# Patient Record
Sex: Female | Born: 1935 | Race: White | Hispanic: No | Smoking: Never smoker
Health system: Southern US, Community
[De-identification: ages and names within clinical notes are randomized; demographics above are authoritative.]

## PROBLEM LIST (undated history)

## (undated) DIAGNOSIS — Z800 Family history of malignant neoplasm of digestive organs: Secondary | ICD-10-CM

## (undated) DIAGNOSIS — K589 Irritable bowel syndrome without diarrhea: Secondary | ICD-10-CM

## (undated) DIAGNOSIS — K623 Rectal prolapse: Secondary | ICD-10-CM

## (undated) DIAGNOSIS — F3289 Other specified depressive episodes: Secondary | ICD-10-CM

## (undated) DIAGNOSIS — F329 Major depressive disorder, single episode, unspecified: Secondary | ICD-10-CM

## (undated) DIAGNOSIS — M545 Low back pain, unspecified: Secondary | ICD-10-CM

## (undated) DIAGNOSIS — R140 Abdominal distension (gaseous): Secondary | ICD-10-CM

## (undated) DIAGNOSIS — R5383 Other fatigue: Secondary | ICD-10-CM

## (undated) DIAGNOSIS — R197 Diarrhea, unspecified: Secondary | ICD-10-CM

## (undated) DIAGNOSIS — M1990 Unspecified osteoarthritis, unspecified site: Secondary | ICD-10-CM

## (undated) DIAGNOSIS — R5381 Other malaise: Secondary | ICD-10-CM

## (undated) DIAGNOSIS — K5909 Other constipation: Secondary | ICD-10-CM

## (undated) DIAGNOSIS — K626 Ulcer of anus and rectum: Secondary | ICD-10-CM

## (undated) HISTORY — DX: Other malaise: R53.83

## (undated) HISTORY — DX: Other specified depressive episodes: F32.89

## (undated) HISTORY — DX: Ulcer of anus and rectum: K62.6

## (undated) HISTORY — DX: Major depressive disorder, single episode, unspecified: F32.9

## (undated) HISTORY — DX: Abdominal distension (gaseous): R14.0

## (undated) HISTORY — DX: Diarrhea, unspecified: R19.7

## (undated) HISTORY — PX: APPENDECTOMY: SHX54

## (undated) HISTORY — DX: Low back pain: M54.5

## (undated) HISTORY — PX: COLON SURGERY: SHX602

## (undated) HISTORY — DX: Irritable bowel syndrome, unspecified: K58.9

## (undated) HISTORY — PX: CHOLECYSTECTOMY: SHX55

## (undated) HISTORY — PX: ABDOMINAL HYSTERECTOMY: SHX81

## (undated) HISTORY — DX: Other constipation: K59.09

## (undated) HISTORY — DX: Other malaise: R53.81

## (undated) HISTORY — PX: HEMICOLECTOMY: SHX854

## (undated) HISTORY — DX: Family history of malignant neoplasm of digestive organs: Z80.0

## (undated) HISTORY — DX: Unspecified osteoarthritis, unspecified site: M19.90

## (undated) HISTORY — DX: Rectal prolapse: K62.3

## (undated) HISTORY — DX: Low back pain, unspecified: M54.50

## (undated) HISTORY — PX: OTHER SURGICAL HISTORY: SHX169

---

## 1998-12-22 ENCOUNTER — Encounter: Payer: Self-pay | Admitting: Obstetrics and Gynecology

## 1998-12-22 ENCOUNTER — Ambulatory Visit (HOSPITAL_COMMUNITY): Admission: RE | Admit: 1998-12-22 | Discharge: 1998-12-22 | Payer: Self-pay | Admitting: Obstetrics and Gynecology

## 2000-01-04 ENCOUNTER — Ambulatory Visit (HOSPITAL_COMMUNITY): Admission: RE | Admit: 2000-01-04 | Discharge: 2000-01-04 | Payer: Self-pay | Admitting: Obstetrics and Gynecology

## 2000-01-04 ENCOUNTER — Encounter: Payer: Self-pay | Admitting: Obstetrics and Gynecology

## 2000-01-20 ENCOUNTER — Other Ambulatory Visit: Admission: RE | Admit: 2000-01-20 | Discharge: 2000-01-20 | Payer: Self-pay | Admitting: Obstetrics and Gynecology

## 2000-05-10 ENCOUNTER — Encounter: Payer: Self-pay | Admitting: Urology

## 2000-05-15 ENCOUNTER — Inpatient Hospital Stay (HOSPITAL_COMMUNITY): Admission: RE | Admit: 2000-05-15 | Discharge: 2000-05-17 | Payer: Self-pay | Admitting: Urology

## 2002-02-21 ENCOUNTER — Emergency Department (HOSPITAL_COMMUNITY): Admission: EM | Admit: 2002-02-21 | Discharge: 2002-02-22 | Payer: Self-pay | Admitting: Emergency Medicine

## 2003-01-29 ENCOUNTER — Other Ambulatory Visit: Admission: RE | Admit: 2003-01-29 | Discharge: 2003-01-29 | Payer: Self-pay | Admitting: Obstetrics and Gynecology

## 2004-08-25 ENCOUNTER — Other Ambulatory Visit: Admission: RE | Admit: 2004-08-25 | Discharge: 2004-08-25 | Payer: Self-pay | Admitting: Obstetrics and Gynecology

## 2004-11-23 ENCOUNTER — Ambulatory Visit: Payer: Self-pay | Admitting: Internal Medicine

## 2004-11-24 ENCOUNTER — Ambulatory Visit: Payer: Self-pay | Admitting: Internal Medicine

## 2005-09-13 ENCOUNTER — Ambulatory Visit: Payer: Self-pay | Admitting: Internal Medicine

## 2005-09-16 ENCOUNTER — Encounter (INDEPENDENT_AMBULATORY_CARE_PROVIDER_SITE_OTHER): Payer: Self-pay | Admitting: Specialist

## 2005-09-16 ENCOUNTER — Ambulatory Visit: Payer: Self-pay | Admitting: Internal Medicine

## 2005-10-26 ENCOUNTER — Ambulatory Visit: Payer: Self-pay | Admitting: Internal Medicine

## 2005-11-09 LAB — HM COLONOSCOPY: HM Colonoscopy: NORMAL

## 2007-06-25 ENCOUNTER — Ambulatory Visit: Payer: Self-pay | Admitting: Internal Medicine

## 2007-08-07 ENCOUNTER — Emergency Department (HOSPITAL_COMMUNITY): Admission: EM | Admit: 2007-08-07 | Discharge: 2007-08-07 | Payer: Self-pay | Admitting: Emergency Medicine

## 2008-04-22 ENCOUNTER — Encounter: Payer: Self-pay | Admitting: Internal Medicine

## 2008-04-22 DIAGNOSIS — K589 Irritable bowel syndrome without diarrhea: Secondary | ICD-10-CM | POA: Insufficient documentation

## 2008-04-22 DIAGNOSIS — K623 Rectal prolapse: Secondary | ICD-10-CM | POA: Insufficient documentation

## 2008-04-22 DIAGNOSIS — K5909 Other constipation: Secondary | ICD-10-CM | POA: Insufficient documentation

## 2008-04-22 DIAGNOSIS — K626 Ulcer of anus and rectum: Secondary | ICD-10-CM | POA: Insufficient documentation

## 2008-06-10 ENCOUNTER — Encounter: Payer: Self-pay | Admitting: Internal Medicine

## 2008-07-24 ENCOUNTER — Telehealth: Payer: Self-pay | Admitting: Internal Medicine

## 2008-08-21 ENCOUNTER — Ambulatory Visit (HOSPITAL_COMMUNITY): Admission: RE | Admit: 2008-08-21 | Discharge: 2008-08-21 | Payer: Self-pay | Admitting: Internal Medicine

## 2008-08-21 ENCOUNTER — Ambulatory Visit: Payer: Self-pay | Admitting: Internal Medicine

## 2008-08-21 DIAGNOSIS — R5381 Other malaise: Secondary | ICD-10-CM | POA: Insufficient documentation

## 2008-08-21 DIAGNOSIS — R5383 Other fatigue: Secondary | ICD-10-CM | POA: Insufficient documentation

## 2008-08-21 LAB — CONVERTED CEMR LAB: Tissue Transglutaminase Ab, IgA: 0.4 units (ref <7)

## 2008-08-22 ENCOUNTER — Encounter: Payer: Self-pay | Admitting: Internal Medicine

## 2008-08-22 LAB — CONVERTED CEMR LAB
Basophils Absolute: 0 10*3/uL (ref 0.0–0.1)
Basophils Relative: 0.6 % (ref 0.0–3.0)
Eosinophils Absolute: 0.1 10*3/uL (ref 0.0–0.7)
Eosinophils Relative: 1.7 % (ref 0.0–5.0)
HCT: 38.5 % (ref 36.0–46.0)
Hemoglobin: 13.4 g/dL (ref 12.0–15.0)
Iron: 70 ug/dL (ref 42–145)
Lymphocytes Relative: 12.9 % (ref 12.0–46.0)
MCHC: 34.8 g/dL (ref 30.0–36.0)
MCV: 92.5 fL (ref 78.0–100.0)
Monocytes Absolute: 0.4 10*3/uL (ref 0.1–1.0)
Monocytes Relative: 5.9 % (ref 3.0–12.0)
Neutro Abs: 5 10*3/uL (ref 1.4–7.7)
Neutrophils Relative %: 78.9 % — ABNORMAL HIGH (ref 43.0–77.0)
Platelets: 285 10*3/uL (ref 150–400)
RBC: 4.17 M/uL (ref 3.87–5.11)
RDW: 12.7 % (ref 11.5–14.6)
Saturation Ratios: 21.6 % (ref 20.0–50.0)
Transferrin: 231.8 mg/dL (ref >=212.0)
WBC: 6.3 10*3/uL (ref 4.5–10.5)

## 2008-09-08 ENCOUNTER — Telehealth: Payer: Self-pay | Admitting: Internal Medicine

## 2008-09-09 ENCOUNTER — Ambulatory Visit: Payer: Self-pay | Admitting: Gastroenterology

## 2008-09-16 ENCOUNTER — Telehealth: Payer: Self-pay | Admitting: Internal Medicine

## 2008-10-07 ENCOUNTER — Ambulatory Visit: Payer: Self-pay | Admitting: Internal Medicine

## 2008-11-06 ENCOUNTER — Ambulatory Visit: Payer: Self-pay | Admitting: Internal Medicine

## 2008-12-04 ENCOUNTER — Ambulatory Visit: Payer: Self-pay | Admitting: Internal Medicine

## 2008-12-04 DIAGNOSIS — F331 Major depressive disorder, recurrent, moderate: Secondary | ICD-10-CM | POA: Insufficient documentation

## 2009-01-01 ENCOUNTER — Ambulatory Visit: Payer: Self-pay | Admitting: Internal Medicine

## 2009-01-28 ENCOUNTER — Ambulatory Visit: Payer: Self-pay | Admitting: Endocrinology

## 2009-01-28 DIAGNOSIS — G4700 Insomnia, unspecified: Secondary | ICD-10-CM | POA: Insufficient documentation

## 2009-02-02 LAB — CONVERTED CEMR LAB: TSH: 3.96 microintl units/mL (ref 0.35–5.50)

## 2009-03-04 ENCOUNTER — Ambulatory Visit: Payer: Self-pay | Admitting: Internal Medicine

## 2009-04-01 ENCOUNTER — Ambulatory Visit: Payer: Self-pay | Admitting: Internal Medicine

## 2009-04-03 ENCOUNTER — Telehealth: Payer: Self-pay | Admitting: Internal Medicine

## 2009-04-08 ENCOUNTER — Ambulatory Visit: Payer: Self-pay | Admitting: Internal Medicine

## 2009-06-02 ENCOUNTER — Ambulatory Visit: Payer: Self-pay | Admitting: Internal Medicine

## 2009-06-02 LAB — CONVERTED CEMR LAB
ALT: 19 units/L (ref 0–35)
AST: 21 units/L (ref 0–37)
Albumin: 4 g/dL (ref 3.5–5.2)
Alkaline Phosphatase: 50 units/L (ref 39–117)
BUN: 18 mg/dL (ref 6–23)
Basophils Absolute: 0.1 10*3/uL (ref 0.0–0.1)
Basophils Relative: 1 % (ref 0.0–3.0)
Bilirubin Urine: NEGATIVE
Bilirubin, Direct: 0.1 mg/dL (ref 0.0–0.3)
CO2: 29 meq/L (ref 19–32)
Calcium: 9.2 mg/dL (ref 8.4–10.5)
Chloride: 111 meq/L (ref 96–112)
Cholesterol: 186 mg/dL (ref 0–200)
Creatinine, Ser: 1 mg/dL (ref 0.4–1.2)
Eosinophils Absolute: 0.2 10*3/uL (ref 0.0–0.7)
Eosinophils Relative: 4.2 % (ref 0.0–5.0)
GFR calc non Af Amer: 57.6 mL/min (ref >60)
Glucose, Bld: 86 mg/dL (ref 70–99)
HCT: 36.6 % (ref 36.0–46.0)
HDL: 30.9 mg/dL — ABNORMAL LOW (ref >39.00)
Hemoglobin, Urine: NEGATIVE
Hemoglobin: 13.2 g/dL (ref 12.0–15.0)
Ketones, ur: NEGATIVE mg/dL
LDL Cholesterol: 122 mg/dL — ABNORMAL HIGH (ref 0–99)
Lymphocytes Relative: 16.1 % (ref 12.0–46.0)
Lymphs Abs: 0.9 10*3/uL (ref 0.7–4.0)
MCHC: 35.9 g/dL (ref 30.0–36.0)
MCV: 90.7 fL (ref 78.0–100.0)
Monocytes Absolute: 0.3 10*3/uL (ref 0.1–1.0)
Monocytes Relative: 5.9 % (ref 3.0–12.0)
Neutro Abs: 4.3 10*3/uL (ref 1.4–7.7)
Neutrophils Relative %: 72.8 % (ref 43.0–77.0)
Nitrite: NEGATIVE
Platelets: 278 10*3/uL (ref 150.0–400.0)
Potassium: 4.4 meq/L (ref 3.5–5.1)
RBC: 4.04 M/uL (ref 3.87–5.11)
RDW: 11.9 % (ref 11.5–14.6)
Sodium: 143 meq/L (ref 135–145)
Specific Gravity, Urine: 1.01 (ref 1.000–1.030)
TSH: 4.01 microintl units/mL (ref 0.35–5.50)
Total Bilirubin: 1.1 mg/dL (ref 0.3–1.2)
Total CHOL/HDL Ratio: 6
Total Protein, Urine: NEGATIVE mg/dL
Total Protein: 6.9 g/dL (ref 6.0–8.3)
Triglycerides: 167 mg/dL — ABNORMAL HIGH (ref 0.0–149.0)
Urine Glucose: NEGATIVE mg/dL
Urobilinogen, UA: 0.2 (ref 0.0–1.0)
VLDL: 33.4 mg/dL (ref 0.0–40.0)
WBC: 5.8 10*3/uL (ref 4.5–10.5)
pH: 5.5 (ref 5.0–8.0)

## 2009-06-03 ENCOUNTER — Encounter: Payer: Self-pay | Admitting: Internal Medicine

## 2009-06-03 DIAGNOSIS — M1990 Unspecified osteoarthritis, unspecified site: Secondary | ICD-10-CM | POA: Insufficient documentation

## 2009-06-03 DIAGNOSIS — M545 Low back pain, unspecified: Secondary | ICD-10-CM | POA: Insufficient documentation

## 2009-07-01 ENCOUNTER — Ambulatory Visit: Payer: Self-pay | Admitting: Internal Medicine

## 2009-07-01 ENCOUNTER — Encounter (INDEPENDENT_AMBULATORY_CARE_PROVIDER_SITE_OTHER): Payer: Self-pay | Admitting: *Deleted

## 2009-07-01 DIAGNOSIS — E781 Pure hyperglyceridemia: Secondary | ICD-10-CM | POA: Insufficient documentation

## 2009-08-20 ENCOUNTER — Ambulatory Visit: Payer: Self-pay | Admitting: Internal Medicine

## 2009-09-07 ENCOUNTER — Ambulatory Visit: Payer: Self-pay | Admitting: Internal Medicine

## 2009-09-30 ENCOUNTER — Encounter: Payer: Self-pay | Admitting: Internal Medicine

## 2009-10-07 ENCOUNTER — Ambulatory Visit: Payer: Self-pay | Admitting: Internal Medicine

## 2009-10-15 ENCOUNTER — Encounter: Payer: Self-pay | Admitting: Internal Medicine

## 2009-12-28 ENCOUNTER — Ambulatory Visit: Payer: Self-pay | Admitting: Internal Medicine

## 2010-01-05 ENCOUNTER — Encounter: Payer: Self-pay | Admitting: Internal Medicine

## 2010-01-19 ENCOUNTER — Encounter: Admission: RE | Admit: 2010-01-19 | Discharge: 2010-01-19 | Payer: Self-pay | Admitting: Internal Medicine

## 2010-01-19 LAB — HM MAMMOGRAPHY: HM Mammogram: NEGATIVE

## 2010-02-02 ENCOUNTER — Telehealth: Payer: Self-pay | Admitting: Internal Medicine

## 2010-02-04 ENCOUNTER — Encounter: Payer: Self-pay | Admitting: Internal Medicine

## 2010-02-15 ENCOUNTER — Telehealth: Payer: Self-pay | Admitting: Internal Medicine

## 2010-02-19 ENCOUNTER — Telehealth: Payer: Self-pay | Admitting: Internal Medicine

## 2010-03-02 ENCOUNTER — Telehealth: Payer: Self-pay | Admitting: Internal Medicine

## 2010-03-18 ENCOUNTER — Encounter: Payer: Self-pay | Admitting: Internal Medicine

## 2010-03-29 ENCOUNTER — Ambulatory Visit: Payer: Self-pay | Admitting: Internal Medicine

## 2010-04-23 ENCOUNTER — Encounter: Payer: Self-pay | Admitting: Internal Medicine

## 2010-04-26 ENCOUNTER — Encounter: Payer: Self-pay | Admitting: Internal Medicine

## 2010-04-28 ENCOUNTER — Encounter: Payer: Self-pay | Admitting: Internal Medicine

## 2010-05-27 ENCOUNTER — Ambulatory Visit: Payer: Self-pay | Admitting: Internal Medicine

## 2010-05-27 DIAGNOSIS — E669 Obesity, unspecified: Secondary | ICD-10-CM | POA: Insufficient documentation

## 2010-05-27 DIAGNOSIS — N390 Urinary tract infection, site not specified: Secondary | ICD-10-CM | POA: Insufficient documentation

## 2010-05-27 LAB — CONVERTED CEMR LAB
Bilirubin Urine: NEGATIVE
Hemoglobin, Urine: NEGATIVE
Ketones, ur: NEGATIVE mg/dL
Leukocytes, UA: NEGATIVE
Nitrite: NEGATIVE
Specific Gravity, Urine: 1.025 (ref 1.000–1.030)
Total Protein, Urine: NEGATIVE mg/dL
Urine Glucose: NEGATIVE mg/dL
Urobilinogen, UA: 0.2 (ref 0.0–1.0)
pH: 6 (ref 5.0–8.0)

## 2010-06-17 ENCOUNTER — Telehealth: Payer: Self-pay | Admitting: Internal Medicine

## 2010-06-21 ENCOUNTER — Telehealth: Payer: Self-pay | Admitting: Internal Medicine

## 2010-06-22 ENCOUNTER — Ambulatory Visit: Payer: Self-pay | Admitting: Internal Medicine

## 2010-07-07 ENCOUNTER — Telehealth: Payer: Self-pay | Admitting: Internal Medicine

## 2010-07-07 ENCOUNTER — Ambulatory Visit: Payer: Self-pay | Admitting: Internal Medicine

## 2010-07-12 ENCOUNTER — Telehealth: Payer: Self-pay | Admitting: Internal Medicine

## 2010-07-12 ENCOUNTER — Encounter: Payer: Self-pay | Admitting: Internal Medicine

## 2010-07-28 ENCOUNTER — Encounter: Admission: RE | Admit: 2010-07-28 | Discharge: 2010-07-28 | Payer: Self-pay | Admitting: Sports Medicine

## 2010-08-24 ENCOUNTER — Ambulatory Visit: Payer: Self-pay | Admitting: Internal Medicine

## 2010-09-15 ENCOUNTER — Encounter (INDEPENDENT_AMBULATORY_CARE_PROVIDER_SITE_OTHER): Payer: Self-pay | Admitting: *Deleted

## 2010-09-16 ENCOUNTER — Ambulatory Visit: Payer: Self-pay | Admitting: Internal Medicine

## 2010-09-21 ENCOUNTER — Encounter: Payer: Self-pay | Admitting: Internal Medicine

## 2010-10-05 ENCOUNTER — Ambulatory Visit: Payer: Self-pay | Admitting: Internal Medicine

## 2010-10-06 ENCOUNTER — Telehealth: Payer: Self-pay | Admitting: Internal Medicine

## 2010-11-01 ENCOUNTER — Ambulatory Visit: Payer: Self-pay | Admitting: Internal Medicine

## 2010-11-01 DIAGNOSIS — R609 Edema, unspecified: Secondary | ICD-10-CM | POA: Insufficient documentation

## 2010-11-01 DIAGNOSIS — M7989 Other specified soft tissue disorders: Secondary | ICD-10-CM | POA: Insufficient documentation

## 2010-11-01 DIAGNOSIS — I959 Hypotension, unspecified: Secondary | ICD-10-CM | POA: Insufficient documentation

## 2010-11-01 DIAGNOSIS — K219 Gastro-esophageal reflux disease without esophagitis: Secondary | ICD-10-CM | POA: Insufficient documentation

## 2010-11-01 DIAGNOSIS — H02849 Edema of unspecified eye, unspecified eyelid: Secondary | ICD-10-CM | POA: Insufficient documentation

## 2010-11-01 LAB — CONVERTED CEMR LAB
ALT: 12 units/L (ref 0–35)
AST: 16 units/L (ref 0–37)
Albumin: 3.6 g/dL (ref 3.5–5.2)
Alkaline Phosphatase: 62 units/L (ref 39–117)
BUN: 19 mg/dL (ref 6–23)
Basophils Absolute: 0 10*3/uL (ref 0.0–0.1)
Basophils Relative: 0.9 % (ref 0.0–3.0)
Bilirubin Urine: NEGATIVE
Bilirubin, Direct: 0.1 mg/dL (ref 0.0–0.3)
CO2: 29 meq/L (ref 19–32)
Calcium: 9.4 mg/dL (ref 8.4–10.5)
Chloride: 106 meq/L (ref 96–112)
Creatinine, Ser: 0.9 mg/dL (ref 0.4–1.2)
Eosinophils Absolute: 0.1 10*3/uL (ref 0.0–0.7)
Eosinophils Relative: 1.7 % (ref 0.0–5.0)
GFR calc non Af Amer: 68.28 mL/min (ref >60)
Glucose, Bld: 104 mg/dL — ABNORMAL HIGH (ref 70–99)
HCT: 34.6 % — ABNORMAL LOW (ref 36.0–46.0)
Hemoglobin, Urine: NEGATIVE
Hemoglobin: 12.1 g/dL (ref 12.0–15.0)
Ketones, ur: NEGATIVE mg/dL
Leukocytes, UA: NEGATIVE
Lymphocytes Relative: 13 % (ref 12.0–46.0)
Lymphs Abs: 0.7 10*3/uL (ref 0.7–4.0)
MCHC: 34.8 g/dL (ref 30.0–36.0)
MCV: 92.5 fL (ref 78.0–100.0)
Monocytes Absolute: 0.3 10*3/uL (ref 0.1–1.0)
Monocytes Relative: 6.1 % (ref 3.0–12.0)
Neutro Abs: 4 10*3/uL (ref 1.4–7.7)
Neutrophils Relative %: 78.3 % — ABNORMAL HIGH (ref 43.0–77.0)
Nitrite: NEGATIVE
Platelets: 258 10*3/uL (ref 150.0–400.0)
Potassium: 4.8 meq/L (ref 3.5–5.1)
Pro B Natriuretic peptide (BNP): 34.6 pg/mL (ref 0.0–100.0)
RBC: 3.74 M/uL — ABNORMAL LOW (ref 3.87–5.11)
RDW: 13.2 % (ref 11.5–14.6)
Sodium: 140 meq/L (ref 135–145)
Specific Gravity, Urine: 1.015 (ref 1.000–1.030)
TSH: 2.15 microintl units/mL (ref 0.35–5.50)
Total Bilirubin: 0.4 mg/dL (ref 0.3–1.2)
Total Protein, Urine: NEGATIVE mg/dL
Total Protein: 6.2 g/dL (ref 6.0–8.3)
Urine Glucose: NEGATIVE mg/dL
Urobilinogen, UA: 0.2 (ref 0.0–1.0)
WBC: 5.1 10*3/uL (ref 4.5–10.5)
pH: 5.5 (ref 5.0–8.0)

## 2010-11-09 ENCOUNTER — Telehealth: Payer: Self-pay | Admitting: Internal Medicine

## 2010-11-11 ENCOUNTER — Ambulatory Visit: Payer: Self-pay | Admitting: Internal Medicine

## 2010-11-11 DIAGNOSIS — R279 Unspecified lack of coordination: Secondary | ICD-10-CM | POA: Insufficient documentation

## 2010-11-29 ENCOUNTER — Ambulatory Visit: Payer: Self-pay | Admitting: Internal Medicine

## 2010-11-29 DIAGNOSIS — L600 Ingrowing nail: Secondary | ICD-10-CM | POA: Insufficient documentation

## 2010-11-29 DIAGNOSIS — L03039 Cellulitis of unspecified toe: Secondary | ICD-10-CM | POA: Insufficient documentation

## 2010-11-29 DIAGNOSIS — L02619 Cutaneous abscess of unspecified foot: Secondary | ICD-10-CM | POA: Insufficient documentation

## 2010-12-02 ENCOUNTER — Telehealth: Payer: Self-pay | Admitting: Internal Medicine

## 2010-12-08 ENCOUNTER — Ambulatory Visit: Payer: Self-pay | Admitting: Internal Medicine

## 2010-12-08 DIAGNOSIS — R209 Unspecified disturbances of skin sensation: Secondary | ICD-10-CM | POA: Insufficient documentation

## 2010-12-08 DIAGNOSIS — D538 Other specified nutritional anemias: Secondary | ICD-10-CM | POA: Insufficient documentation

## 2010-12-08 LAB — CONVERTED CEMR LAB
Basophils Absolute: 0 10*3/uL (ref 0.0–0.1)
Basophils Relative: 0.4 % (ref 0.0–3.0)
Eosinophils Absolute: 0.1 10*3/uL (ref 0.0–0.7)
Eosinophils Relative: 2.5 % (ref 0.0–5.0)
Folate: 11.7 ng/mL
HCT: 34.7 % — ABNORMAL LOW (ref 36.0–46.0)
Hemoglobin: 12.2 g/dL (ref 12.0–15.0)
Iron: 65 ug/dL (ref 42–145)
Lymphocytes Relative: 19.3 % (ref 12.0–46.0)
Lymphs Abs: 0.8 10*3/uL (ref 0.7–4.0)
MCHC: 35.2 g/dL (ref 30.0–36.0)
MCV: 92.1 fL (ref 78.0–100.0)
Monocytes Absolute: 0.4 10*3/uL (ref 0.1–1.0)
Monocytes Relative: 8.2 % (ref 3.0–12.0)
Neutro Abs: 3 10*3/uL (ref 1.4–7.7)
Neutrophils Relative %: 69.6 % (ref 43.0–77.0)
Platelets: 305 10*3/uL (ref 150.0–400.0)
RBC: 3.77 M/uL — ABNORMAL LOW (ref 3.87–5.11)
RDW: 13.5 % (ref 11.5–14.6)
Saturation Ratios: 19.6 % — ABNORMAL LOW (ref 20.0–50.0)
Transferrin: 237.1 mg/dL (ref 212.0–360.0)
Vitamin B-12: 244 pg/mL (ref 211–911)
WBC: 4.4 10*3/uL — ABNORMAL LOW (ref 4.5–10.5)

## 2010-12-14 ENCOUNTER — Ambulatory Visit (HOSPITAL_COMMUNITY)
Admission: RE | Admit: 2010-12-14 | Discharge: 2010-12-14 | Payer: Self-pay | Source: Home / Self Care | Attending: Internal Medicine | Admitting: Internal Medicine

## 2011-01-15 ENCOUNTER — Encounter: Payer: Self-pay | Admitting: Internal Medicine

## 2011-01-16 ENCOUNTER — Encounter: Payer: Self-pay | Admitting: Internal Medicine

## 2011-01-25 NOTE — Progress Notes (Signed)
Summary: Vicodin request  Phone Note Refill Request Message from:  Fax from Pharmacy on February 02, 2010 3:27 PM  Vicodin 5-500 Sig: 1 po tid prn for pain. **This not on patient med list. Please advise.  Initial call taken by: Lucious Groves,  February 02, 2010 3:28 PM    Prescriptions: HYDROCODONE-ACETAMINOPHEN 5-500 MG TABS (HYDROCODONE-ACETAMINOPHEN) One by mouth three times a day as needed hip pain  #75 x 3   Entered and Authorized by:   [name] MD   Signed by:   [name] MD on 02/02/2010   Method used:   Print then Give to Patient   RxID:   [id number]

## 2011-01-25 NOTE — Progress Notes (Signed)
Summary: refill request  Phone Note Refill Request Message from:  surescript on March 02, 2010 1:53 PM  Refills Requested: Medication #1:  LORAZEPAM 2 MG TABS Take 1 tab by mouth two times a day as needed for anxiety   Supply Requested: 1 month   Last Refilled: 02/05/2010  Medication #2:  AMBIEN 10 MG TABS at bedtime   Dosage confirmed as above?Dosage Confirmed   Supply Requested: 1 month   Last Refilled: 02/05/2010 Is this ok to refill for patient?  Initial call taken by: Rock Nephew CMA,  March 02, 2010 1:54 PM  Follow-up for Phone Call        yes Follow-up by: [name] MD,  March 02, 2010 6:41 PM    Prescriptions: LORAZEPAM 2 MG TABS (LORAZEPAM) Take 1 tab by mouth two times a day as needed for anxiety  #60 x 3   Entered by:   Rock Nephew CMA   Authorized by:   [name] MD   Signed by:   Rock Nephew CMA on 03/03/2010   Method used:   Telephoned to ...       [address] [phone]* (retail)       [address] [place], [place]  [address]       Ph: [phone]       Fax: [phone]   RxID:   [id number] AMBIEN 10 MG TABS (ZOLPIDEM TARTRATE) at bedtime  #30 x 3   Entered by:   Rock Nephew CMA   Authorized by:   [name] MD   Signed by:   Rock Nephew CMA on 03/03/2010   Method used:   Telephoned to ...       [address] [phone]* (retail)       [address] [place], [place]  [address]       Ph: [phone]       Fax: [phone]   RxID:   [id number]

## 2011-01-25 NOTE — Letter (Signed)
Summary: [facility] Consult Scheduled Letter  [facility]  [address] [place], [place] [address]   Phone: [phone]  Fax: [phone]      07/01/2009 MRN: [id number]  Sherri Weber [address] [name], [place]  [address]    Dear [name],      We have scheduled an appointment for you.  At the recommendation of Dr. Yetta Barre, we have scheduled you a consult with Dr. Juanda Chance on August 20, 2009 at 9:45am.  Their phone number is [phone].  If this appointment day and time is not convenient for you, please feel free to call the office of the doctor you are being referred to at the number listed above and reschedule the appointment.  Dr. Georgina [facility] [address] [place] [address] [place], [place] [address]  *Please give 24hr notice to cancel/reschedule your appointment to avoid a $50.00 fee*  Thank you,  Patient Care Coordinator [facility]

## 2011-01-25 NOTE — Assessment & Plan Note (Signed)
Summary: NO ENERGY/NWS   Vital Signs:  Patient profile:   77 year old female Menstrual status:  regular Height:      66 inches Weight:      129 pounds BMI:     20.90 O2 Sat:      98 % on Room air Temp:     97.9 degrees F oral Pulse rate:   69 / minute Pulse rhythm:   regular Resp:     16 per minute BP sitting:   122 / 68  (left arm) Cuff size:   large  Vitals Entered By: Rock Nephew CMA (March 29, 2010 8:45 AM)  O2 Flow:  Room air CC: follow-up visit, Depression Is Patient Diabetic? No Pain Assessment Patient in pain? no        Primary Care Provider:  Sanda Linger, MD  CC:  follow-up visit and Depression.  History of Present Illness: She has stopped taking Pristiq b/c it wasn't helping her. She has worsening s/s of depression.  Depression History:      The patient comes in today for her fourth follow up visit for depression.  She notes that the symptoms started approximately 01/09/2006.  The patient is having a depressed mood most of the day and has a diminished interest in her usual daily activities.  Positive alarm features for depression include significant weight loss, insomnia, fatigue (loss of energy), and feelings of worthlessness (guilt).  However, she denies significant weight gain, hypersomnia, psychomotor agitation, psychomotor retardation, impaired concentration (indecisiveness), and recurrent thoughts of death or suicide.  The patient denies symptoms of a manic disorder including persistently & abnormally elevated mood, abnormally & persistently irritable mood, less need for sleep, talkative or feels need to keep talking, distractibility, flight of ideas, increase in goal-directed activity, and psychomotor agitation.        Psychosocial stress factors include a recent traumatic event.  Risk factors for depression include a personal history of depression.  The patient denies that she feels like life is not worth living, denies that she wishes that she were dead,  and denies that she has thought about ending her life.         Current Medications (verified): 1)  Lorazepam 2 Mg Tabs (Lorazepam) .... Take 1 Tab By Mouth Two Times A Day As Needed For Anxiety 2)  Ambien 10 Mg Tabs (Zolpidem Tartrate) .... At Bedtime 3)  Macrodantin 100 Mg Caps (Nitrofurantoin Macrocrystal) .... As Needed 4)  Oxybutynin Chloride 10 Mg Xr24h-Tab (Oxybutynin Chloride) 5)  Hydrocodone-Acetaminophen 5-500 Mg Tabs (Hydrocodone-Acetaminophen) .... One By Mouth Three Times A Day As Needed Hip Pain 6)  Benefiber 7)  Promethazine Hcl 12.5 Mg Tabs (Promethazine Hcl) .Marland Kitchen.. 1 Every 6 Hours As Needed 8)  Trazodone Hcl 50 Mg Tabs (Trazodone Hcl) .Marland Kitchen.. 1-2 By Mouth At Bedtime For Insomnia 9)  Robaxin 500 Mg Tabs (Methocarbamol) .... One By Mouth Three Times A Day As Needed For Muscle Spasm  Allergies (verified): No Known Drug Allergies  Past History:  Past Medical History: Reviewed history from 06/02/2009 and no changes required. DEPRESSIVE DISORDER (ICD-311) DIARRHEA (ICD-787.91) FATIGUE (ICD-780.79) DIARRHEA (ICD-787.91) ABDOMINAL DISTENSION (ICD-787.3) HEMORRHOIDS, INTERNAL (ICD-455.0) CONSTIPATION, CHRONIC (ICD-564.09) CARCINOMA, COLON, FAMILY HX (ICD-V16.0) PROLAPSE, RECTAL (ICD-569.1) ULCER, RECTUM (ICD-569.41) IBS (ICD-564.1) Osteoarthritis Low back pain  Past Surgical History: Reviewed history from 09/09/2008 and no changes required. Cholecystectomy Hysterectomy Appendectomy -1998 S/P RIGHT HEMICOLECTOMY,CECECTOMY  Family History: Reviewed history from 08/20/2008 and no changes required. Family History of Colon Cancer: Sister Family History  of Diabetes: Sister  Social History: Reviewed history from 11/06/2008 and no changes required. 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  The patient denies chest pain, syncope, dyspnea on exertion,  peripheral edema, prolonged cough, headaches, hemoptysis, abdominal pain, melena, hematochezia, severe indigestion/heartburn, hematuria, suspicious skin lesions, difficulty walking, abnormal bleeding, enlarged lymph nodes, and angioedema.    Physical Exam  General:  alert and oriented.well-developed, well-nourished, well-hydrated, appropriate dress, normal appearance, healthy-appearing, cooperative to examination, and good hygiene.   Head:  normocephalic and atraumatic.   Eyes:  vision grossly intact, pupils equal, and pupils round.   Mouth:  no gingival abnormalities, pharynx pink and moist, no erythema, and no exudates.   Neck:  supple, full ROM, no masses, no thyromegaly, no carotid bruits, no cervical lymphadenopathy, and no neck tenderness.   Lungs:  Clear throughout to auscultation. Heart:  Regular rate and rhythm; no murmurs, rubs or bruits. Abdomen:  soft abdomen with normal active bowel sounds, decreased muscle tone. Liver edge at costal margin. No palpable mass or tenderness. Msk:  normal ROM, no joint tenderness, no joint swelling, no joint warmth, and no redness over joints.   Pulses:  R and L carotid,radial,femoral,dorsalis pedis and posterior tibial pulses are full and equal bilaterally Extremities:  No clubbing, cyanosis, edema, or deformity noted with normal full range of motion of all joints.   Neurologic:  No cranial nerve deficits noted. Station and gait are normal. Plantar reflexes are down-going bilaterally. DTRs are symmetrical throughout. Sensory, motor and coordinative functions appear intact. Skin:  turgor normal, color normal, no rashes, no suspicious lesions, no ecchymoses, no petechiae, no purpura, no ulcerations, and no edema.   Cervical Nodes:  no anterior cervical adenopathy and no posterior cervical adenopathy.   Axillary Nodes:  no R axillary adenopathy and no L axillary adenopathy.   Inguinal Nodes:  no R inguinal adenopathy and no L inguinal adenopathy.   Psych:   Oriented X3, memory intact for recent and remote, normally interactive, good eye contact, not anxious appearing, not depressed appearing, not agitated, and not suicidal.     Impression & Recommendations:  Problem # 1:  PROLAPSE, RECTAL (ICD-569.1) she is scheduled  to have surgery at [facility] with Dr. Mia Creek to repair this.  Problem # 2:  MAJOR DPRSV DISORDER RECURRENT EPISODE MODERATE (ICD-296.32) Assessment: Deteriorated try remeron  Complete Medication List: 1)  Lorazepam 2 Mg Tabs (Lorazepam) .... Take 1 tab by mouth two times a day as needed for anxiety 2)  Ambien 10 Mg Tabs (Zolpidem tartrate) .... At bedtime 3)  Macrodantin 100 Mg Caps (Nitrofurantoin macrocrystal) .... As needed 4)  Oxybutynin Chloride 10 Mg Xr24h-tab (Oxybutynin chloride) 5)  Hydrocodone-acetaminophen 5-500 Mg Tabs (Hydrocodone-acetaminophen) .... One by mouth three times a day as needed hip pain 6)  Benefiber  7)  Promethazine Hcl 12.5 Mg Tabs (Promethazine hcl) .Marland Kitchen.. 1 every 6 hours as needed 8)  Trazodone Hcl 50 Mg Tabs (Trazodone hcl) .Marland Kitchen.. 1-2 by mouth at bedtime for insomnia 9)  Robaxin 500 Mg Tabs (Methocarbamol) .... One by mouth three times a day as needed for muscle spasm 10)  Mirtazapine 15 Mg Tabs (Mirtazapine) .... One by mouth at bedtime for depression  Patient Instructions: 1)  Please schedule a follow-up appointment in 2 months. Prescriptions: MIRTAZAPINE 15 MG TABS (MIRTAZAPINE) One by mouth at bedtime for depression  #30 x 11  Entered and Authorized by:   [name] MD   Signed by:   [name] MD on 03/29/2010   Method used:   Electronically to        [facility] [phone]* (retail)       [address] [place], [place]  [address]       Ph: [phone]       Fax: [phone]   RxID:   [id number]

## 2011-01-25 NOTE — Consult Note (Signed)
Summary: GI/[facility]   GI/[facility]   Imported By: Sherian Rein 05/20/2010 10:57:20  _____________________________________________________________________  External Attachment:    Type:   Image     Comment:   External Document

## 2011-01-25 NOTE — Assessment & Plan Note (Signed)
Summary: 2 mo f/u $50/cd   Vital Signs:  Patient profile:   77 year old female Height:      66 inches Weight:      138 pounds BMI:     22.35 O2 Sat:      97 % on Room air Temp:     97.0 degrees F oral Pulse rate:   85 / minute Pulse rhythm:   regular BP sitting:   140 / 90  (right arm) Cuff size:   regular  Vitals Entered By: Rock Nephew CMA (June 02, 2009 8:24 AM)  O2 Flow:  Room air CC: discuss emotional stress, Depression Is Patient Diabetic? No Pain Assessment Patient in pain? no        Primary Care Provider:  Sheryle Spray  CC:  discuss emotional stress and Depression.  History of Present Illness: She returns for f/up and wants to start Pristiq for depression. She has visited her daughter recently in [place] and feels sad about her daughter being an addict.  She has unchanged pain in her low back and joints and she requests a refill on hydrocodone.  Depression History:      The patient is having a depressed mood most of the day and has a diminished interest in her usual daily activities.  Positive alarm features for depression include significant weight gain, insomnia, psychomotor retardation, fatigue (loss of energy), and feelings of worthlessness (guilt).  However, she denies hypersomnia, psychomotor agitation, impaired concentration (indecisiveness), and recurrent thoughts of death or suicide.  The patient denies symptoms of a manic disorder including persistently & abnormally elevated mood, abnormally & persistently irritable mood, less need for sleep, talkative or feels need to keep talking, distractibility, flight of ideas, excessive buying sprees, and excessive sexual indiscretions.        Psychosocial stress factors include a recent traumatic event.  Risk factors for depression include a personal history of depression.  The patient denies that she feels like life is not worth living, denies that she wishes that she were dead, and denies that she has thought about  ending her life.         Preventive Screening-Counseling & Management  Alcohol-Tobacco     Alcohol drinks/day: 0  Current Medications (verified): 1)  Lorazepam 2 Mg Tabs (Lorazepam) .... Take 1 Tab By Mouth Two Times A Day As Needed For Anxiety 2)  Ambien 10 Mg Tabs (Zolpidem Tartrate) .... At Bedtime 3)  Macrodantin 100 Mg Caps (Nitrofurantoin Macrocrystal) .... As Needed 4)  Colestid 1 Gm Tabs (Colestipol Hcl) .... Take 2 Tablets By Mouth Two Times A Day 5)  Gabapentin 100 Mg Caps (Gabapentin) .... Take 1 Tablet By Mouth Three Times A Day 6)  Oxybutynin Chloride 10 Mg Xr24h-Tab (Oxybutynin Chloride) 7)  Hydrocodone-Acetaminophen 5-500 Mg Tabs (Hydrocodone-Acetaminophen) .... One By Mouth Three Times A Day As Needed Hip Pain 8)  Benefiber 9)  Promethazine Hcl 12.5 Mg Tabs (Promethazine Hcl) .Marland Kitchen.. 1 Every 6 Hours As Needed 10)  Trazodone Hcl 50 Mg Tabs (Trazodone Hcl) .Marland Kitchen.. 1-2 By Mouth At Bedtime For Insomnia 11)  Anucort-Hc 25 Mg Supp (Hydrocortisone Acetate) .... Insert 1 Suppository Into Rectum At Bedtime 12)  Phendimetrazine 105 Mg .... Take 1 Tablet By Mouth Every Morning 13)  Restoril 30 Mg Caps (Temazepam) .... Take 1 Tablet By Mouth At Bedtime As Needed For Sleep.  Allergies (verified): No Known Drug Allergies  Past History:  Past Medical History: DEPRESSIVE DISORDER (ICD-311) DIARRHEA (ICD-787.91) FATIGUE (ICD-780.79) DIARRHEA (ICD-787.91)  ABDOMINAL DISTENSION (ICD-787.3) HEMORRHOIDS, INTERNAL (ICD-455.0) CONSTIPATION, CHRONIC (ICD-564.09) CARCINOMA, COLON, FAMILY HX (ICD-V16.0) PROLAPSE, RECTAL (ICD-569.1) ULCER, RECTUM (ICD-569.41) IBS (ICD-564.1) Osteoarthritis Low back pain  Past Surgical History: Reviewed history from 09/09/2008 and no changes required. Cholecystectomy Hysterectomy Appendectomy -1998 S/P RIGHT HEMICOLECTOMY,CECECTOMY  Family History: Reviewed history from 08/20/2008 and no changes required. Family History of Colon Cancer:  Sister Family History of Diabetes: Sister  Social History: Reviewed history from 11/06/2008 and no changes required. 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  The patient denies anorexia, fever, weight loss, chest pain, syncope, dyspnea on exertion, peripheral edema, prolonged cough, headaches, hemoptysis, abdominal pain, hematuria, muscle weakness, and difficulty walking.   GU:  Denies discharge, dysuria, hematuria, incontinence, nocturia, urinary frequency, and urinary hesitancy. MS:  Complains of joint pain, low back pain, and stiffness; denies joint redness, joint swelling, loss of strength, mid back pain, muscle aches, muscle weakness, and thoracic pain.  Physical Exam  General:  alert and oriented. Head:  Normocephalic and atraumatic without obvious abnormalities. No apparent alopecia or balding. Mouth:  no gingival abnormalities, pharynx pink and moist, no erythema, and no exudates.   Neck:  supple, full ROM, no masses, no thyromegaly, no carotid bruits, no cervical lymphadenopathy, and no neck tenderness.   Lungs:  Clear throughout to auscultation. Heart:  Regular rate and rhythm; no murmurs, rubs or bruits. Abdomen:  soft abdomen with normal active bowel sounds, decreased muscle tone. Liver edge at costal margin. No palpable mass or tenderness. Msk:  No deformity or scoliosis noted of thoracic or lumbar spine.   Pulses:  R and L carotid,radial,femoral,dorsalis pedis and posterior tibial pulses are full and equal bilaterally Extremities:  No clubbing, cyanosis, edema or deformities noted. Neurologic:  No cranial nerve deficits noted. Station and gait are normal. Plantar reflexes are down-going bilaterally. DTRs are symmetrical throughout. Sensory, motor and coordinative functions appear intact. Skin:  turgor normal, color normal, no rashes, no suspicious lesions, no  ecchymoses, no petechiae, no purpura, no ulcerations, and no edema.   Cervical Nodes:  No lymphadenopathy noted Psych:  Oriented X3, memory intact for recent and remote, normally interactive, good eye contact, not anxious appearing, not agitated, and subdued.     Impression & Recommendations:  Problem # 1:  UTI (ICD-599.0)  Her updated medication list for this problem includes:    Macrodantin 100 Mg Caps (Nitrofurantoin macrocrystal) .Marland Kitchen... As needed    Oxybutynin Chloride 10 Mg Xr24h-tab (Oxybutynin chloride)  Orders: T-Urine Culture (Spectrum Order) [id number]) TLB-Lipid Panel (80061-LIPID) TLB-BMP (Basic Metabolic Panel-BMET) (80048-METABOL) TLB-CBC Platelet - w/Differential (85025-CBCD) TLB-Hepatic/Liver Function Pnl (80076-HEPATIC) TLB-TSH (Thyroid Stimulating Hormone) (84443-TSH) TLB-Udip w/ Micro (81001-URINE)  Problem # 2:  ELEVATED BLOOD PRESSURE WITHOUT DIAGNOSIS OF HYPERTENSION (ICD-796.2)  Orders: T-Urine Culture (Spectrum Order) ([id number]) TLB-Lipid Panel (80061-LIPID) TLB-BMP (Basic Metabolic Panel-BMET) (80048-METABOL) TLB-CBC Platelet - w/Differential (85025-CBCD) TLB-Hepatic/Liver Function Pnl (80076-HEPATIC) TLB-TSH (Thyroid Stimulating Hormone) (84443-TSH) TLB-Udip w/ Micro (81001-URINE)  Problem # 3:  MAJOR DPRSV DISORDER RECURRENT EPISODE MODERATE (ICD-296.32)  Orders: T-Urine Culture (Spectrum Order) ([id number]) TLB-Lipid Panel (80061-LIPID) TLB-BMP (Basic Metabolic Panel-BMET) (80048-METABOL) TLB-CBC Platelet - w/Differential (85025-CBCD) TLB-Hepatic/Liver Function Pnl (80076-HEPATIC) TLB-TSH (Thyroid Stimulating Hormone) (84443-TSH) TLB-Udip w/ Micro (81001-URINE)  Problem # 4:  OSTEOARTHRITIS (ICD-715.90) Assessment: Unchanged  Her updated medication list for this problem includes:    Hydrocodone-acetaminophen 5-500 Mg Tabs (Hydrocodone-acetaminophen) ..... One by mouth three times a day  as needed hip pain  Problem # 5:  LOW BACK PAIN  (ICD-724.2) Assessment: Unchanged  Her updated medication list for this problem includes:    Hydrocodone-acetaminophen 5-500 Mg Tabs (Hydrocodone-acetaminophen) ..... One by mouth three times a day as needed hip pain  Complete Medication List: 1)  Lorazepam 2 Mg Tabs (Lorazepam) .... Take 1 tab by mouth two times a day as needed for anxiety 2)  Ambien 10 Mg Tabs (Zolpidem tartrate) .... At bedtime 3)  Macrodantin 100 Mg Caps (Nitrofurantoin macrocrystal) .... As needed 4)  Colestid 1 Gm Tabs (Colestipol hcl) .... Take 2 tablets by mouth two times a day 5)  Gabapentin 100 Mg Caps (Gabapentin) .... Take 1 tablet by mouth three times a day 6)  Oxybutynin Chloride 10 Mg Xr24h-tab (Oxybutynin chloride) 7)  Hydrocodone-acetaminophen 5-500 Mg Tabs (Hydrocodone-acetaminophen) .... One by mouth three times a day as needed hip pain 8)  Benefiber  9)  Promethazine Hcl 12.5 Mg Tabs (Promethazine hcl) .Marland Kitchen.. 1 every 6 hours as needed 10)  Trazodone Hcl 50 Mg Tabs (Trazodone hcl) .Marland Kitchen.. 1-2 by mouth at bedtime for insomnia 11)  Anucort-hc 25 Mg Supp (Hydrocortisone acetate) .... Insert 1 suppository into rectum at bedtime 12)  Phendimetrazine 105 Mg  .... Take 1 tablet by mouth every morning 13)  Restoril 30 Mg Caps (Temazepam) .... Take 1 tablet by mouth at bedtime as needed for sleep. 14)  Pristiq 50 Mg Xr24h-tab (Desvenlafaxine succinate) .... Once daily  Patient Instructions: 1)  Please schedule a follow-up appointment in 1 month. 2)  It is important that you exercise regularly at least 20 minutes 5 times a week. If you develop chest pain, have severe difficulty breathing, or feel very tired , stop exercising immediately and seek medical attention. 3)  You need to lose weight. Consider a lower calorie diet and regular exercise.  4)  Check your Blood Pressure regularly. If it is above: you should make an appointment. Prescriptions: HYDROCODONE-ACETAMINOPHEN 5-500 MG TABS (HYDROCODONE-ACETAMINOPHEN) One  by mouth three times a day as needed hip pain  #75 x 3   Entered and Authorized by:   [name] MD   Signed by:   [name] MD on 06/02/2009   Method used:   Print then Give to Patient   RxID:   [id number] PRISTIQ 50 MG XR24H-TAB (DESVENLAFAXINE SUCCINATE) once daily  #42 x 0   Entered and Authorized by:   [name] MD   Signed by:   [name] MD on 06/02/2009   Method used:   Print then Give to Patient   RxID:   [id number]

## 2011-01-25 NOTE — Progress Notes (Signed)
  Phone Note Call from Patient Call back at Home Phone [phone]   Caller: Patient Call For: [name] MD Summary of Call: Pt feels "lifeless" today, weak legs and tired. Pt requesting results of labs also to see if this could be causing these symptoms.  Initial call taken by: Verdell Face,  November 09, 2010 10:19 AM  Follow-up for Phone Call        labs showed mild anemia but were otherwise normal Follow-up by: [name] MD,  November 09, 2010 10:56 AM  Additional Follow-up for Phone Call Additional follow up Details #1::        Left detailed vm on pt's hm # Additional Follow-up by: Lamar Sprinkles, CMA,  November 09, 2010 5:24 PM

## 2011-01-25 NOTE — Assessment & Plan Note (Signed)
Summary: FU BEFORE GOING OUT OF TOWN/ #/NWS   Vital Signs:  Patient profile:   77 year old female Menstrual status:  regular Height:      66 inches Weight:      134 pounds BMI:     21.71 O2 Sat:      97 % Temp:     98.5 degrees F oral Pulse rhythm:   regular BP sitting:   110 / 74  (left arm) Cuff size:   large  Vitals Entered By: Rock Nephew CMA (October 07, 2009 9:48 AM) CC: follow-up visit   Primary Care Provider:  Sanda Linger, MD  CC:  follow-up visit.  History of Present Illness: F/up and info about a GI doctor she saw last week (Dr. Jacqulyn Bath). He did a flex sig and found a persistent rectal ulcer but she doesn't know if he has any recommendations for treatment yet. She is going to [place] for the winter and needs Rx refills.  Preventive Screening-Counseling & Management  Alcohol-Tobacco     Alcohol drinks/day: 0     Smoking Status: never     Passive Smoke Exposure: no  Clinical Review Panels:  Lipid Management   Cholesterol:  186 (06/02/2009)   LDL (bad choesterol):  122 (06/02/2009)   HDL (good cholesterol):  30.90 (06/02/2009)  CBC   WBC:  5.8 (06/02/2009)   RBC:  4.04 (06/02/2009)   Hgb:  13.2 (06/02/2009)   Hct:  36.6 (06/02/2009)   Platelets:  278.0 (06/02/2009)   MCV  90.7 (06/02/2009)   MCHC  35.9 (06/02/2009)   RDW  11.9 (06/02/2009)   PMN:  72.8 (06/02/2009)   Lymphs:  16.1 (06/02/2009)   Monos:  5.9 (06/02/2009)   Eosinophils:  4.2 (06/02/2009)   Basophil:  1.0 (06/02/2009)  Complete Metabolic Panel   Glucose:  86 (06/02/2009)   Sodium:  143 (06/02/2009)   Potassium:  4.4 (06/02/2009)   Chloride:  111 (06/02/2009)   CO2:  29 (06/02/2009)   BUN:  18 (06/02/2009)   Creatinine:  1.0 (06/02/2009)   Albumin:  4.0 (06/02/2009)   Total Protein:  6.9 (06/02/2009)   Calcium:  9.2 (06/02/2009)   Total Bili:  1.1 (06/02/2009)   Alk Phos:  50 (06/02/2009)   SGPT (ALT):  19 (06/02/2009)   SGOT (AST):  21 (06/02/2009)   Current Medications  (verified): 1)  Lorazepam 2 Mg Tabs (Lorazepam) .... Take 1 Tab By Mouth Two Times A Day As Needed For Anxiety 2)  Ambien 10 Mg Tabs (Zolpidem Tartrate) .... At Bedtime 3)  Macrodantin 100 Mg Caps (Nitrofurantoin Macrocrystal) .... As Needed 4)  Colestid 1 Gm Tabs (Colestipol Hcl) .... Take 2 Tablets By Mouth Two Times A Day 5)  Gabapentin 100 Mg Caps (Gabapentin) .... Take 1 Tablet By Mouth Three Times A Day 6)  Oxybutynin Chloride 10 Mg Xr24h-Tab (Oxybutynin Chloride) 7)  Hydrocodone-Acetaminophen 5-500 Mg Tabs (Hydrocodone-Acetaminophen) .... One By Mouth Three Times A Day As Needed Hip Pain 8)  Benefiber 9)  Promethazine Hcl 12.5 Mg Tabs (Promethazine Hcl) .Marland Kitchen.. 1 Every 6 Hours As Needed 10)  Trazodone Hcl 50 Mg Tabs (Trazodone Hcl) .Marland Kitchen.. 1-2 By Mouth At Bedtime For Insomnia 11)  Anucort-Hc 25 Mg Supp (Hydrocortisone Acetate) .... Insert 1 Suppository Into Rectum At Bedtime 12)  Phendimetrazine 105 Mg .... Take 1 Tablet By Mouth Every Morning As Needed 13)  Restoril 30 Mg Caps (Temazepam) .... Take 1 Tablet By Mouth At Bedtime As Needed For Sleep. 14)  Pristiq 50  Mg Xr24h-Tab (Desvenlafaxine Succinate) .... Once Daily  Allergies (verified): No Known Drug Allergies  Past History:  Past Medical History: Reviewed history from 06/02/2009 and no changes required. DEPRESSIVE DISORDER (ICD-311) DIARRHEA (ICD-787.91) FATIGUE (ICD-780.79) DIARRHEA (ICD-787.91) ABDOMINAL DISTENSION (ICD-787.3) HEMORRHOIDS, INTERNAL (ICD-455.0) CONSTIPATION, CHRONIC (ICD-564.09) CARCINOMA, COLON, FAMILY HX (ICD-V16.0) PROLAPSE, RECTAL (ICD-569.1) ULCER, RECTUM (ICD-569.41) IBS (ICD-564.1) Osteoarthritis Low back pain  Past Surgical History: Reviewed history from 09/09/2008 and no changes required. Cholecystectomy Hysterectomy Appendectomy -1998 S/P RIGHT HEMICOLECTOMY,CECECTOMY  Family History: Reviewed history from 08/20/2008 and no changes required. Family History of Colon Cancer:  Sister Family History of Diabetes: Sister  Social History: Reviewed history from 11/06/2008 and no changes required. 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   General:  Denies chills, fatigue, fever, loss of appetite, malaise, sweats, weakness, and weight loss. GI:  Denies abdominal pain, bloody stools, change in bowel habits, hemorrhoids, indigestion, loss of appetite, nausea, and vomiting. Psych:  Complains of anxiety; denies depression, easily angered, easily tearful, irritability, mental problems, panic attacks, sense of great danger, suicidal thoughts/plans, thoughts of violence, unusual visions or sounds, and thoughts /plans of harming others.  Physical Exam  General:  alert and oriented. Head:  normocephalic and atraumatic.   Eyes:  vision grossly intact, pupils equal, and pupils round.   Ears:  R ear normal and L ear normal.   Mouth:  no gingival abnormalities, pharynx pink and moist, no erythema, and no exudates.   Neck:  supple, full ROM, no masses, no thyromegaly, no carotid bruits, no cervical lymphadenopathy, and no neck tenderness.   Lungs:  Clear throughout to auscultation. Heart:  Regular rate and rhythm; no murmurs, rubs or bruits. Abdomen:  soft abdomen with normal active bowel sounds, decreased muscle tone. Liver edge at costal margin. No palpable mass or tenderness. Msk:  normal ROM, no joint tenderness, no joint swelling, no joint warmth, and no redness over joints.   Extremities:  No clubbing, cyanosis, edema or deformities noted. Skin:  turgor normal, color normal, no rashes, no suspicious lesions, no ecchymoses, no petechiae, no purpura, no ulcerations, and no edema.    Psych:  Oriented X3, memory intact for recent and remote, normally interactive, good eye contact, not anxious appearing, not depressed appearing, not agitated, not suicidal, and not homicidal.     Impression & Recommendations:  Problem # 1:  ULCER, RECTUM (ICD-569.41) Assessment Unchanged continue to f/up with GI for treatment options, continue hydrocodone for pain for now.  Problem # 2:  ELEVATED BLOOD PRESSURE WITHOUT DIAGNOSIS OF HYPERTENSION (ICD-796.2) Assessment: Unchanged  BP today: 110/74 Prior BP: 130/82 (09/07/2009)  Labs Reviewed: Creat: 1.0 (06/02/2009) Chol: 186 (06/02/2009)   HDL: 30.90 (06/02/2009)   LDL: 122 (06/02/2009)   TG: 167.0 (06/02/2009)  Instructed in low sodium diet (DASH Handout) and behavior modification.    Problem # 3:  MAJOR DPRSV DISORDER RECURRENT EPISODE MODERATE (ICD-296.32) Assessment: Improved  Complete Medication List: 1)  Lorazepam 2 Mg Tabs (Lorazepam) .... Take 1 tab by mouth two times a day as needed for anxiety 2)  Ambien 10 Mg Tabs (Zolpidem tartrate) .... At bedtime 3)  Macrodantin 100 Mg Caps (Nitrofurantoin macrocrystal) .... As needed 4)  Colestid 1 Gm Tabs (Colestipol hcl) .... Take 2 tablets by mouth two times  a day 5)  Gabapentin 100 Mg Caps (Gabapentin) .... Take 1 tablet by mouth three times a day 6)  Oxybutynin Chloride 10 Mg Xr24h-tab (Oxybutynin chloride) 7)  Hydrocodone-acetaminophen 5-500 Mg Tabs (Hydrocodone-acetaminophen) .... One by mouth three times a day as needed hip pain 8)  Benefiber  9)  Promethazine Hcl 12.5 Mg Tabs (Promethazine hcl) .Marland Kitchen.. 1 every 6 hours as needed 10)  Trazodone Hcl 50 Mg Tabs (Trazodone hcl) .Marland Kitchen.. 1-2 by mouth at bedtime for insomnia 11)  Anucort-hc 25 Mg Supp (Hydrocortisone acetate) .... Insert 1 suppository into rectum at bedtime 12)  Phendimetrazine 105 Mg  .... Take 1 tablet by mouth every morning as needed 13)  Restoril 30 Mg Caps (Temazepam) .... Take 1 tablet by mouth at bedtime as  needed for sleep. 14)  Pristiq 50 Mg Xr24h-tab (Desvenlafaxine succinate) .... Once daily  Patient Instructions: 1)  Please schedule a follow-up appointment in 4 months. Prescriptions: HYDROCODONE-ACETAMINOPHEN 5-500 MG TABS (HYDROCODONE-ACETAMINOPHEN) One by mouth three times a day as needed hip pain  #75 x 3   Entered and Authorized by:   [name] MD   Signed by:   [name] MD on 10/07/2009   Method used:   Print then Give to Patient   RxID:   [id number] LORAZEPAM 2 MG TABS (LORAZEPAM) Take 1 tab by mouth two times a day as needed for anxiety  #60 x 3   Entered and Authorized by:   [name] MD   Signed by:   [name] MD on 10/07/2009   Method used:   Print then Give to Patient   RxID:   [id number] AMBIEN 10 MG TABS (ZOLPIDEM TARTRATE) at bedtime  #30 x 3   Entered and Authorized by:   [name] MD   Signed by:   [name] MD on 10/07/2009   Method used:   Print then Give to Patient   RxID:   [id number]

## 2011-01-25 NOTE — Assessment & Plan Note (Signed)
Summary: NEW TO ESTABLISH/$50/CD   Vital Signs:  Patient Profile:   77 Years Old Female Height:     66 inches Weight:      142 pounds BMI:     23.00 Temp:     98.0 degrees F oral Pulse rate:   84 / minute Pulse rhythm:   regular BP sitting:   126 / 88  (left arm) Cuff size:   regular  Vitals Entered By: Rock Nephew CMA (November 06, 2008 4:04 PM)                 PCP:  [facility]  Chief Complaint:  discuss stomach- weight and gas.  History of Present Illness: This is a new pt. to me who c/o's "a gassy and big tummy". She reports an extensive work-up by GI and states symptoms are improved somewhat on Align and Colestid.    Prior Medications Reviewed Using: List Brought by Patient  Updated Prior Medication List: LORAZEPAM 2 MG TABS (LORAZEPAM) Take 1 tab by mouth at bedtime AMBIEN 10 MG TABS (ZOLPIDEM TARTRATE) at bedtime MACRODANTIN 100 MG CAPS (NITROFURANTOIN MACROCRYSTAL) as needed COLESTID 1 GM TABS (COLESTIPOL HCL) Take 2 tablets by mouth two times a day GABAPENTIN 100 MG CAPS (GABAPENTIN) Take 1 tablet by mouth three times a day OXYBUTYNIN CHLORIDE 10 MG XR24H-TAB (OXYBUTYNIN CHLORIDE)  ZOLOFT 100 MG TABS (SERTRALINE HCL) Take 1 tablet by mouth once a day HYDROCODONE-ACETAMINOPHEN 5-500 MG TABS (HYDROCODONE-ACETAMINOPHEN) as needed  Current Allergies (reviewed today): No known allergies   Past Medical History:    Reviewed history from 04/22/2008 and no changes required:       Current Problems:        HEMORRHOIDS, INTERNAL (ICD-455.0)       CONSTIPATION, CHRONIC (ICD-564.09)       CARCINOMA, COLON, FAMILY HX (ICD-V16.0)       PROLAPSE, RECTAL (ICD-569.1)       ULCER, RECTUM (ICD-569.41)       IBS (ICD-564.1)         Past Surgical History:    Reviewed history from 09/09/2008 and no changes required:       Cholecystectomy       Hysterectomy       Appendectomy -1998       S/P RIGHT HEMICOLECTOMY,CECECTOMY   Family History:    Reviewed history from  08/20/2008 and no changes required:       Family History of Colon Cancer: Sister       Family History of Diabetes: Sister  Social History:    Reviewed history from 08/20/2008 and no changes required:       Occupation: Retired       Illicit Drug Use - no       Never Smoked       Regular exercise-no       Alcohol use-no   Risk Factors:  Tobacco use:  never Passive smoke exposure:  no Drug use:  no HIV high-risk behavior:  no Alcohol use:  no Exercise:  no  Family History Risk Factors:    Family History of MI in females < 48 years old:  no    Family History of MI in males < 78 years old:  no   Review of Systems       The patient complains of weight gain.  The patient denies anorexia, fever, weight loss, hoarseness, chest pain, syncope, dyspnea on exertion, peripheral edema, prolonged cough, headaches, hemoptysis, abdominal pain, melena, hematochezia, severe indigestion/heartburn, hematuria, incontinence,  suspicious skin lesions, depression, abnormal bleeding, enlarged lymph nodes, and breast masses.    GI      Complains of change in bowel habits, constipation, diarrhea, and gas.      Denies abdominal pain, bloody stools, dark tarry stools, excessive appetite, hemorrhoids, indigestion, loss of appetite, nausea, vomiting, vomiting blood, and yellowish skin color.   Physical Exam  General:     alert, well-developed, well-nourished, well-hydrated, appropriate dress, normal appearance, healthy-appearing, and cooperative to examination.   Eyes:     no icterus Mouth:     Oral mucosa and oropharynx without lesions or exudates.  Teeth in good repair. Neck:     supple, full ROM, no masses, no thyromegaly, and no thyroid nodules or tenderness.   Lungs:     Normal respiratory effort, chest expands symmetrically. Lungs are clear to auscultation, no crackles or wheezes. Heart:     Normal rate and regular rhythm. S1 and S2 normal without gallop, murmur, click, rub or other extra  sounds. Abdomen:     moderately protuberant.soft, non-tender, no masses, no guarding, no rigidity, no rebound tenderness, no abdominal hernia, no inguinal hernia, no hepatomegaly, no splenomegaly, abdominal scar(s), and bowel sounds hyperactive.   Pulses:     R and L carotid,radial,femoral,dorsalis pedis and posterior tibial pulses are full and equal bilaterally Neurologic:     No cranial nerve deficits noted. Station and gait are normal. Plantar reflexes are down-going bilaterally. DTRs are symmetrical throughout. Sensory, motor and coordinative functions appear intact. Skin:     turgor normal, color normal, no rashes, and no edema.   Cervical Nodes:     No lymphadenopathy noted Psych:     Cognition and judgment appear intact. Alert and cooperative with normal attention span and concentration. No apparent delusions, illusions, hallucinations    Impression & Recommendations:  Problem # 1:  IBS (ICD-564.1) Assessment: Unchanged  Complete Medication List: 1)  Lorazepam 2 Mg Tabs (Lorazepam) .... Take 1 tab by mouth at bedtime 2)  Ambien 10 Mg Tabs (Zolpidem tartrate) .... At bedtime 3)  Macrodantin 100 Mg Caps (Nitrofurantoin macrocrystal) .... As needed 4)  Colestid 1 Gm Tabs (Colestipol hcl) .... Take 2 tablets by mouth two times a day 5)  Gabapentin 100 Mg Caps (Gabapentin) .... Take 1 tablet by mouth three times a day 6)  Oxybutynin Chloride 10 Mg Xr24h-tab (Oxybutynin chloride) 7)  Zoloft 100 Mg Tabs (Sertraline hcl) .... Take 1 tablet by mouth once a day 8)  Hydrocodone-acetaminophen 5-500 Mg Tabs (Hydrocodone-acetaminophen) .... As needed   Patient Instructions: 1)  Please schedule a follow-up appointment in 1 month. 2)  It is important that you exercise regularly at least 20 minutes 5 times a week. If you develop chest pain, have severe difficulty breathing, or feel very tired , stop exercising immediately and seek medical attention. 3)  You need to lose weight. Consider a  lower calorie diet and regular exercise.  4)  It is not healthy  for men to drink more than 2-3 drinks per day or for women to drink more than 1-2 drinks per day.   ]  Appended Document: NEW TO ESTABLISH/$50/CD As billing provider, I have reviewed this document.

## 2011-01-25 NOTE — Progress Notes (Signed)
Summary: OV TODAY  Phone Note Call from Patient   Summary of Call: Pt c/o recurrent shoulder pain. Heat and muscle relaxers have not helped. Scheduled for office visit today w/Dr Jonny Ruiz.  Initial call taken by: Lamar Sprinkles, CMA,  July 07, 2010 11:40 AM

## 2011-01-25 NOTE — Letter (Signed)
[facility]: [facility]   Imported By: Sherian Rein 09/21/2010 12:36:14  _____________________________________________________________________  External Attachment:    Type:   Image     Comment:   External Document

## 2011-01-25 NOTE — Progress Notes (Signed)
----   Converted from flag ---- ---- 07/12/2010 11:07 AM, Corwin Levins MD wrote: ok  ---- 07/12/2010 11:07 AM, Verdell Face wrote: Dr Miguel Aschoff, has decided she would like a referral to a orthopedic, [phone]!  [name] ------------------------------

## 2011-01-25 NOTE — Assessment & Plan Note (Signed)
Summary: CONSTIPATION/DIARRHEA AND OCC. RECTAL BLEEDING   Sherri Weber    History of Present Illness Visit Type: follow up Primary GI MD: Lina Sar MD Primary Provider: [facility] Chief Complaint: diarrhea History of Present Illness:   77 year old white female with history of a rectal ulcer, constipation and diarrhea. She is status post appendectomy and cecectomy in 1998 as well as status post  posterior colporrhaphy in 2007 in [place] where she was visiting her daughter. She is status post open cholecystectomy in 2004. Patient has a family history of colon cancer in her sister. Last colonoscopy was done in November 2005.  Flexible sigmoidoscopy was in November 2006 confirming persistent ulcer. Her main complaint today is abdominal distention, diarrhea and decreased energy.   GI Review of Systems      Denies abdominal pain, acid reflux, belching, bloating, chest pain, dysphagia with liquids, dysphagia with solids, heartburn, loss of appetite, nausea, vomiting, vomiting blood, weight loss, and  weight gain.           Prior Medications Reviewed Using: Patient Recall  Updated Prior Medication List: LORAZEPAM 1 MG TABS (LORAZEPAM) once daily AMBIEN 10 MG TABS (ZOLPIDEM TARTRATE) at bedtime ZOLOFT 100 MG TABS (SERTRALINE HCL) once daily MACRODANTIN 100 MG CAPS (NITROFURANTOIN MACROCRYSTAL) as needed  Current Allergies: No known allergies   Past Medical History:    Reviewed history from 04/22/2008 and no changes required:       Current Problems:        HEMORRHOIDS, INTERNAL (ICD-455.0)       CONSTIPATION, CHRONIC (ICD-564.09)       CARCINOMA, COLON, FAMILY HX (ICD-V16.0)       PROLAPSE, RECTAL (ICD-569.1)       ULCER, RECTUM (ICD-569.41)       IBS (ICD-564.1)         Past Surgical History:    Reviewed history from 08/20/2008 and no changes required:       Cholecystectomy       Hysterectomy       Appendectomy -1998   Family History:    Reviewed history from 08/20/2008 and no  changes required:       Family History of Colon Cancer: Sister       Family History of Diabetes: Sister  Social History:    Reviewed history from 08/20/2008 and no changes required:       Occupation: Retired       Alcohol Use - yes-socially       Illicit Drug Use - no    Review of Systems       The patient complains of anxiety-new, depression-new, fatigue, sleeping problems, urination - excessive, and urine leakage.     Vital Signs:  Patient Profile:   77 Years Old Female Height:     67 inches Weight:      138 pounds BMI:     21.69 Pulse rate:   60 / minute Pulse rhythm:   regular BP sitting:   130 / 72  (right arm)  Vitals Entered By: June McMurray CMA (August 21, 2008 10:57 AM)                  Physical Exam  General:     Well developed, well nourished, no acute distress. Neck:     Supple; no masses or thyromegaly. Lungs:     Clear throughout to auscultation. Heart:     Regular rate and rhythm; no murmurs, rubs,  or bruits. Abdomen:     abdomen is protuberant but  soft and nontender. Bowel sounds are hyperactive. There is diffuse tympany in upper abdomen. Liver edge is at costal margin. There is no fluid wave. Rectal:     rectal exam with a soft Hemoccult-positive stool. Extremities:     No clubbing, cyanosis, edema or deformities noted. Neurologic:     Alert and  oriented x4;  grossly normal neurologically.    Impression & Recommendations:  Problem # 1:  CONSTIPATION, CHRONIC (ICD-564.09) History of irritable bowel syndrome/constipation now diarrhea. Patient has a history of a cholecystectomy which could cause bile salt diarrhea. She also has a history of cecectomy which could lead to bacterial overgrowth and diarrhea as well. She will be due for another colonoscopy in one year.  Problem # 2:  PROLAPSE, RECTAL (ICD-569.1) History of rectal ulcer which is benign and history of rectal prolapse repaired surgically in 2007 with questionable results. Patient  is Hemoccult-positive today which is likely due to a recurrent ulcer.  Problem # 3:  CARCINOMA, COLON, FAMILY HX (ICD-V16.0) family history of colon cancer in a sister. Patient is due for colonoscopy in November of 2010.  Other Orders: T-Acute Abdomen (2 view w/ PA & Chest (11914NW) TLB-CBC Platelet - w/Differential (85025-CBCD) TLB-IBC Pnl (Iron/FE;Transferrin) (83550-IBC) T-Tissue Transglutamase Ab IgA (29562-13086)   Patient Instructions: 1)  KUB today with flat and upright x-ray of the abdomen 2)  Will draw TTG, CBC, TIBC today 3)  Flagyl 250 mg p.o. t.i.d. 4)  Colestid 1 g 2 p.o. b.i.d. 5)  Phendimetrazine 105mg  ER, 1 by mouth gd, # 30  3 refills at pt's request, Dr Tonny Bollman used to prescribe it for her and she wants to try it. 6)  Copy Sent To:Dr Avva    Prescriptions: METRONIDAZOLE 250 MG TABS (METRONIDAZOLE) take 1 tablet by mouth three times a day x 10 days  #30 x 0   Entered by:   Hortense Ramal CMA   Authorized by:   Hart Carwin MD   Signed by:   Hortense Ramal CMA on 08/21/2008   Method used:   Electronically to        [facility]  [phone]* (retail)       [address]       [place], [place]  [address]       Ph: [phone] or [phone]       Fax: [phone]   RxID:   [id number] PHENDIMETRAZINE TARTRATE 105 MG XR24H-CAP (PHENDIMETRAZINE TARTRATE) Take 1 tablet by mouth every morning.  #30 x 3   Entered by:   Hortense Ramal CMA   Authorized by:   Hart Carwin MD   Signed by:   Hortense Ramal CMA on 08/21/2008   Method used:   Printed then faxed to ...       [facility]  [address]  [phone]* (retail)       [address]       [place], [place]  [address]       Ph: [phone] or [phone]       Fax: [phone]   RxID:   [id number] COLESTID 1 GM TABS (COLESTIPOL HCL) Take 2 tablets by mouth two times a day  #120 x 3   Entered by:   Hortense Ramal CMA   Authorized by:   Hart Carwin MD    Signed by:   Hortense Ramal CMA on 08/21/2008   Method used:  Electronically to        [facility]  [phone]* (retail)       [address]       [place], [place]  [address]       Ph: [phone] or [phone]       Fax: [phone]   RxID:   [id number]  ]

## 2011-01-25 NOTE — Progress Notes (Signed)
Summary: TRIAGE   Phone Note Call from Patient Call back at Home Phone [phone]   Caller: Patient Call For: BRODIE Reason for Call: Talk to Nurse Summary of Call: PT STILL IN A LOT PAIN HAS BEEN TAKING MEDS DR. Juanda Chance GAVE HER, HAS A LOT OF LOOSE STOOLS (WATERY) Initial call taken by: Tawni Levy,  September 08, 2008 1:38 PM  Follow-up for Phone Call        pt having diarrhea, she stopped the colestid last week when she had a episode of loose stool .  4-5 watery BM today.  Pt instructed to begin colestid again not to take with meals or within 2 hours of any other meds.  Can also use imodium as needed for diarrhea.  Insistent on being seen tomorrow.  REV scheduled with Mike Gip PA  for 1:30 09-09-08.  Follow-up by: Darcey Nora RN,  September 08, 2008 3:01 PM

## 2011-01-25 NOTE — Miscellaneous (Signed)
Summary: hydrocort refill-needs ov for refills!  Written refill request faxed from [facility] in [place], [place]...   Clinical Lists Changes  Medications: Added new medication of ANUCORT-HC 25 MG  SUPP (HYDROCORTISONE ACETATE) Insert 1 suppository into rectum at bedtime.  PATIENT MUST HAVE OFFICE VISIT FOR FURTHER REFILLS! - Signed Rx of ANUCORT-HC 25 MG  SUPP (HYDROCORTISONE ACETATE) Insert 1 suppository into rectum at bedtime.  PATIENT MUST HAVE OFFICE VISIT FOR FURTHER REFILLS!;  #7 x 0;  Signed;  Entered by: Hortense Ramal CMA;  Authorized by: Hart Carwin MD;  Method used: Printed then faxed to    Prescriptions: ANUCORT-HC 25 MG  SUPP (HYDROCORTISONE ACETATE) Insert 1 suppository into rectum at bedtime.  PATIENT MUST HAVE OFFICE VISIT FOR FURTHER REFILLS!  #7 x 0   Entered by:   Hortense Ramal CMA   Authorized by:   Hart Carwin MD   Signed by:   Hortense Ramal CMA on 06/10/2008   Method used:   Printed then faxed to ...         RxID:   [id number]

## 2011-01-25 NOTE — Miscellaneous (Signed)
Summary: Orders Update  Clinical Lists Changes  Orders: Added new Referral order of Orthopedic Surgeon Referral (Ortho Surgeon) - Signed 

## 2011-01-25 NOTE — Procedures (Signed)
Summary: Rectal Ultrasound/[place] [facility]  Rectal [facility]   Imported By: Maryln Gottron 03/26/2010 10:14:18  _____________________________________________________________________  External Attachment:    Type:   Image     Comment:   External Document

## 2011-01-25 NOTE — Assessment & Plan Note (Signed)
Summary: DR Nemiah Commander PT/NO SLOT-BODY ACHES-DIARRHEA-NAUSEA-STC   Vital Signs:  Patient profile:   77 year old female Menstrual status:  regular Height:      65.5 inches Weight:      136.50 pounds BMI:     22.45 O2 Sat:      96 % on Room air Temp:     98.4 degrees F oral Pulse rate:   79 / minute BP sitting:   120 / 80  (left arm) Cuff size:   regular  Vitals Entered By: [name] Ewing CMA (AAMA) (September 16, 2010 11:18 AM)  O2 Flow:  Room air CC: Body aches, diarrhea, fatigue/RE   Primary Care Belvie Iribe:  Sanda Linger, MD  CC:  Body aches, diarrhea, and fatigue/RE.  History of Present Illness: pt of Dr Yetta Barre, here with acute ;  actually much improved and almost cancelled her appt;  states for 4 days prior to this day she has had marked fagitue, general weakness, nausea (with few vomiting), crampy abd pains, and mult episodes of watery diarrhea, overall mild to mod by her estimation;  has been able to take by mouth well, drinking plenty of fluids, denies orthostasis.  Pt denies CP, worsening sob, doe, wheezing, orthopnea, pnd, worsening LE edema, palps, dizziness or syncope  Pt denies new neuro symptoms such as headache, facial or extremity weakness  No fever, wt loss, night sweats,  or other constitutional symptoms , and appetitie now improving.  No overt bleeding or brusing such as with diarrhea or vomiting.   Problems Prior to Update: 1)  Uti  (ICD-599.0) 2)  Obesity  (ICD-278.00) 3)  Routine General Medical Exam@[facility]  (ICD-V70.0) 4)  Hyperlipidemia  (ICD-272.4) 5)  Low Back Pain  (ICD-724.2) 6)  Osteoarthritis  (ICD-715.90) 7)  Insomnia  (ICD-780.52) 8)  Major Dprsv Disorder Recurrent Episode Moderate  (ICD-296.32) 9)  Fatigue  (ICD-780.79) 10)  Constipation, Chronic  (ICD-564.09) 11)  Carcinoma, Colon, Family Hx  (ICD-V16.0) 12)  Prolapse, Rectal  (ICD-569.1) 13)  Ulcer, Rectum  (ICD-569.41) 14)  Ibs  (ICD-564.1)  Medications Prior to Update: 1)  Lorazepam 2 Mg  Tabs (Lorazepam) .... Take 1 Tab By Mouth Two Times A Day As Needed For Anxiety 2)  Ambien 10 Mg Tabs (Zolpidem Tartrate) .... At Bedtime 3)  Macrodantin 100 Mg Caps (Nitrofurantoin Macrocrystal) .... As Needed 4)  Oxybutynin Chloride 10 Mg Xr24h-Tab (Oxybutynin Chloride) 5)  Hydrocodone-Acetaminophen 5-500 Mg Tabs (Hydrocodone-Acetaminophen) .... One By Mouth Three Times A Day As Needed Hip Pain 6)  Benefiber 7)  Promethazine Hcl 12.5 Mg Tabs (Promethazine Hcl) .Marland Kitchen.. 1 Every 6 Hours As Needed 8)  Trazodone Hcl 50 Mg Tabs (Trazodone Hcl) .Marland Kitchen.. 1-2 By Mouth At Bedtime For Insomnia 9)  Mirtazapine 15 Mg Tabs (Mirtazapine) .... One By Mouth At Bedtime For Depression 10)  Phendimetrazine Tartrate 105 Mg Xr24h-Cap (Phendimetrazine Tartrate) .... One By Mouth Once Daily As Needed 11)  Polyethylene Glycol 3350  Powd (Polyethylene Glycol 3350) .... Use As Directed  Current Medications (verified): 1)  Lorazepam 2 Mg Tabs (Lorazepam) .... Take 1 Tab By Mouth Two Times A Day As Needed For Anxiety 2)  Ambien 10 Mg Tabs (Zolpidem Tartrate) .... At Bedtime 3)  Macrodantin 100 Mg Caps (Nitrofurantoin Macrocrystal) .... As Needed 4)  Oxybutynin Chloride 10 Mg Xr24h-Tab (Oxybutynin Chloride) 5)  Hydrocodone-Acetaminophen 5-500 Mg Tabs (Hydrocodone-Acetaminophen) .... One By Mouth Three Times A Day As Needed Hip Pain 6)  Benefiber 7)  Promethazine Hcl 12.5 Mg Tabs (Promethazine  Hcl) .... 1 Every 6 Hours As Needed 8)  Trazodone Hcl 50 Mg Tabs (Trazodone Hcl) .Marland Kitchen.. 1-2 By Mouth At Bedtime For Insomnia 9)  Mirtazapine 15 Mg Tabs (Mirtazapine) .... One By Mouth At Bedtime For Depression 10)  Phendimetrazine Tartrate 105 Mg Xr24h-Cap (Phendimetrazine Tartrate) .... One By Mouth Once Daily As Needed 11)  Polyethylene Glycol 3350  Powd (Polyethylene Glycol 3350) .... Use As Directed  Allergies (verified): No Known Drug Allergies  Past History:  Past Medical History: Last updated: 06/02/2009 DEPRESSIVE DISORDER  (ICD-311) DIARRHEA (ICD-787.91) FATIGUE (ICD-780.79) DIARRHEA (ICD-787.91) ABDOMINAL DISTENSION (ICD-787.3) HEMORRHOIDS, INTERNAL (ICD-455.0) CONSTIPATION, CHRONIC (ICD-564.09) CARCINOMA, COLON, FAMILY HX (ICD-V16.0) PROLAPSE, RECTAL (ICD-569.1) ULCER, RECTUM (ICD-569.41) IBS (ICD-564.1) Osteoarthritis Low back pain  Past Surgical History: Last updated: 09/09/2008 Cholecystectomy Hysterectomy Appendectomy -1998 S/P RIGHT HEMICOLECTOMY,CECECTOMY  Social History: Last updated: 11/06/2008 Occupation: Retired Illicit Drug Use - no Never Smoked Regular exercise-no Alcohol use-no  Risk Factors: Alcohol Use: 0 (08/24/2010) Exercise: yes (08/24/2010)  Risk Factors: Smoking Status: never (08/24/2010) Passive Smoke Exposure: no (08/24/2010)  Review of Systems       all otherwise negative per pt -    Physical Exam  General:  alert and well-developed.   Head:  normocephalic and atraumatic.   Eyes:  vision grossly intact, pupils equal, and pupils round.   Ears:  R ear normal and L ear normal.   Nose:  no external deformity and no nasal discharge.   Mouth:  pharyngeal erythema and fair dentition.   Neck:  supple and no masses.   Lungs:  normal respiratory effort and normal breath sounds.   Heart:  normal rate and regular rhythm.   Abdomen:  soft, non-tender, normal bowel sounds, no distention, and no guarding.   Msk:  no joint tenderness and no joint swelling.   Extremities:  no edema, no erythema  Neurologic:  alert & oriented X3.   Skin:  no rashes.   Psych:  normally interactive.     Impression & Recommendations:  Problem # 1:  DIARRHEA OF PRESUMED INFECTIOUS ORIGIN (ICD-009.3) probable viral, with exam c/w acute AGE;  overall much improved today, no need it seems for further eval or tx and exam essentially benign, pt reassured, ok to follow for any worsening symptoms, Continue all previous medications as before this visit , can use immodium as needed further  diarrhea  Complete Medication List: 1)  Lorazepam 2 Mg Tabs (Lorazepam) .... Take 1 tab by mouth two times a day as needed for anxiety 2)  Ambien 10 Mg Tabs (Zolpidem tartrate) .... At bedtime 3)  Macrodantin 100 Mg Caps (Nitrofurantoin macrocrystal) .... As needed 4)  Oxybutynin Chloride 10 Mg Xr24h-tab (Oxybutynin chloride) 5)  Hydrocodone-acetaminophen 5-500 Mg Tabs (Hydrocodone-acetaminophen) .... One by mouth three times a day as needed hip pain 6)  Benefiber  7)  Promethazine Hcl 12.5 Mg Tabs (Promethazine hcl) .Marland Kitchen.. 1 every 6 hours as needed 8)  Trazodone Hcl 50 Mg Tabs (Trazodone hcl) .Marland Kitchen.. 1-2 by mouth at bedtime for insomnia 9)  Mirtazapine 15 Mg Tabs (Mirtazapine) .... One by mouth at bedtime for depression 10)  Phendimetrazine Tartrate 105 Mg Xr24h-cap (Phendimetrazine tartrate) .... One by mouth once daily as needed 11)  Polyethylene Glycol 3350 Powd (Polyethylene glycol 3350) .... Use as directed  Patient Instructions: 1)  Continue all previous medications as before this visit  2)  Please schedule an appointment with your primary doctor as needed

## 2011-01-25 NOTE — Letter (Signed)
Summary: [facility]  [facility]   Imported By: Sherian Rein 05/06/2010 08:03:00  _____________________________________________________________________  External Attachment:    Type:   Image     Comment:   External Document

## 2011-01-25 NOTE — Letter (Signed)
Summary: [facility]   Imported By: Sherian Rein 05/05/2010 08:00:06  _____________________________________________________________________  External Attachment:    Type:   Image     Comment:   External Document

## 2011-01-25 NOTE — Letter (Signed)
Summary: Colonoscopy Letter  [facility]  [address] [place], [place] [address]   Phone: [phone]  Fax: [phone]      September 15, 2010 MRN: [id number]   Sherri Weber [address] [place], [place]  [address]   Dear Sherri Weber,   According to your medical record, it is time for you to schedule a Colonoscopy. The American Cancer Society recommends this procedure as a method to detect early colon cancer. Patients with a family history of colon cancer, or a personal history of colon polyps or inflammatory bowel disease are at increased risk.  This letter has beeen generated based on the recommendations made at the time of your procedure. If you feel that in your particular situation this may no longer apply, please contact our office.  Please call our office at [phone] to schedule this appointment or to update your records at your earliest convenience.  Thank you for cooperating with [place] to provide you with the very best care possible.   Sincerely,  Hedwig Morton. Juanda Chance, M.D.  [facility] [phone]

## 2011-01-25 NOTE — Progress Notes (Signed)
Summary: sick call  Phone Note Call from Patient Call back at Home Phone [phone]   Summary of Call: Patient left message on triage that she is too sick to come in for office visit, but c/o having a virus, loss of appetite, and aching all over. Can pain med be called in for her and any recommendations of other meds for her that can be called in? Please advise. Initial call taken by: Lucious Groves,  June 17, 2010 8:20 AM  Follow-up for Phone Call        no, she's already on hydrocodone Follow-up by: [name] MD,  June 17, 2010 8:23 AM  Additional Follow-up for Phone Call Additional follow up Details #1::        Patient states that she does not have any pain meds and has not had any in a month. Please advise. Additional Follow-up by: Lucious Groves,  June 17, 2010 8:24 AM    Additional Follow-up for Phone Call Additional follow up Details #2::    ok give her one refill Follow-up by: [name] MD,  June 17, 2010 8:30 AM  Prescriptions: HYDROCODONE-ACETAMINOPHEN 5-500 MG TABS (HYDROCODONE-ACETAMINOPHEN) One by mouth three times a day as needed hip pain  #75 x 1   Entered by:   Lucious Groves   Authorized by:   [name] MD   Signed by:   Lucious Groves on 06/17/2010   Method used:   Telephoned to ...       [address] [phone]* (retail)       [address] [place], [place]  [address]       Ph: [phone]       Fax: [phone]   RxID:   [id number]

## 2011-01-25 NOTE — Assessment & Plan Note (Signed)
Summary: FU/NAUSEA/NWS   Vital Signs:  Patient profile:   77 year old female Menstrual status:  regular Height:      66 inches Weight:      127.75 pounds BMI:     20.69 O2 Sat:      96 % on Room air Temp:     98.0 degrees F oral Pulse rate:   78 / minute Pulse rhythm:   regular Resp:     16 per minute BP sitting:   134 / 64  (left arm) Cuff size:   large  Vitals Entered By: Rock Nephew CMA (June 22, 2010 9:55 AM)  O2 Flow:  Room air CC: discuss recent nausea and vomiting//  Is Patient Diabetic? No Pain Assessment Patient in pain? no        Primary Care Provider:  Sanda Linger, MD  CC:  discuss recent nausea and vomiting// .  History of Present Illness: She returns with a chronic/recurrent problem of intermittent N/V associated with stressors (Daughter is in a psych. hospital in [place] for addiction). She feels better today than yesterday.  Preventive Screening-Counseling & Management  Alcohol-Tobacco     Alcohol drinks/day: 0     Smoking Status: never     Passive Smoke Exposure: no  Hep-HIV-STD-Contraception     Hepatitis Risk: no risk noted     HIV Risk: no     STD Risk: no risk noted      Sexual History:  Not active.        Drug Use:  never.        Blood Transfusions:  no.    Current Medications (verified): 1)  Lorazepam 2 Mg Tabs (Lorazepam) .... Take 1 Tab By Mouth Two Times A Day As Needed For Anxiety 2)  Ambien 10 Mg Tabs (Zolpidem Tartrate) .... At Bedtime 3)  Macrodantin 100 Mg Caps (Nitrofurantoin Macrocrystal) .... As Needed 4)  Oxybutynin Chloride 10 Mg Xr24h-Tab (Oxybutynin Chloride) 5)  Hydrocodone-Acetaminophen 5-500 Mg Tabs (Hydrocodone-Acetaminophen) .... One By Mouth Three Times A Day As Needed Hip Pain 6)  Benefiber 7)  Promethazine Hcl 12.5 Mg Tabs (Promethazine Hcl) .Marland Kitchen.. 1 Every 6 Hours As Needed 8)  Trazodone Hcl 50 Mg Tabs (Trazodone Hcl) .Marland Kitchen.. 1-2 By Mouth At Bedtime For Insomnia 9)  Mirtazapine 15 Mg Tabs (Mirtazapine) .... One  By Mouth At Bedtime For Depression 10)  Phendimetrazine Tartrate 105 Mg Xr24h-Cap (Phendimetrazine Tartrate) .... One By Mouth Once Daily As Needed  Allergies (verified): No Known Drug Allergies  Past History:  Past Medical History: Last updated: 06/02/2009 DEPRESSIVE DISORDER (ICD-311) DIARRHEA (ICD-787.91) FATIGUE (ICD-780.79) DIARRHEA (ICD-787.91) ABDOMINAL DISTENSION (ICD-787.3) HEMORRHOIDS, INTERNAL (ICD-455.0) CONSTIPATION, CHRONIC (ICD-564.09) CARCINOMA, COLON, FAMILY HX (ICD-V16.0) PROLAPSE, RECTAL (ICD-569.1) ULCER, RECTUM (ICD-569.41) IBS (ICD-564.1) Osteoarthritis Low back pain  Past Surgical History: Last updated: 09/09/2008 Cholecystectomy Hysterectomy Appendectomy -1998 S/P RIGHT HEMICOLECTOMY,CECECTOMY  Family History: Last updated: 08/20/2008 Family History of Colon Cancer: Sister Family History of Diabetes: Sister  Social History: Last updated: 11/06/2008 Occupation: Retired Illicit Drug Use - no Never Smoked Regular exercise-no Alcohol use-no  Risk Factors: Alcohol Use: 0 (06/22/2010) Exercise: no (11/06/2008)  Risk Factors: Smoking Status: never (06/22/2010) Passive Smoke Exposure: no (06/22/2010)  Family History: Reviewed history from 08/20/2008 and no changes required. Family History of Colon Cancer: Sister Family History of Diabetes: Sister  Social History: Reviewed history from 11/06/2008 and no changes required. 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   GI:  Complains of loss of appetite, nausea, and vomiting; denies abdominal pain, bloody stools, change in bowel habits, constipation, dark tarry stools, diarrhea, gas, hemorrhoids, indigestion, vomiting blood,  and yellowish skin color. Psych:  Complains of anxiety and irritability; denies alternate hallucination ( auditory/visual), depression, easily angered, easily tearful, mental problems, panic attacks, sense of great danger, suicidal thoughts/plans, thoughts of violence, and unusual visions or sounds.  Physical Exam  General:  alert and oriented.well-developed, well-nourished, well-hydrated, appropriate dress, normal appearance, healthy-appearing, cooperative to examination, and good hygiene.   Head:  normocephalic and atraumatic.   Eyes:  vision grossly intact, pupils equal, and pupils round.  pink moist mm., no icterus Mouth:  no gingival abnormalities, pharynx pink and moist, no erythema, and no exudates.   Neck:  supple, full ROM, no masses, no thyromegaly, no carotid bruits, no cervical lymphadenopathy, and no neck tenderness.   Lungs:  normal respiratory effort, no intercostal retractions, no accessory muscle use, normal breath sounds, no dullness, no fremitus, no crackles, and no wheezes.   Heart:  Regular rate and rhythm; no murmurs, rubs or bruits. Abdomen:  soft abdomen with normal active bowel sounds, decreased muscle tone. Liver edge at costal margin. No palpable mass or tenderness. Msk:  normal ROM, no joint tenderness, no joint swelling, no joint warmth, no redness over joints, no joint deformities, no joint instability, no crepitation, and no muscle atrophy.   Pulses:  R and L carotid,radial,femoral,dorsalis pedis and posterior tibial pulses are full and equal bilaterally Extremities:  No clubbing, cyanosis, edema, or deformity noted with normal full range of motion of all joints.   Neurologic:  No cranial nerve deficits noted. Station and gait are normal. Plantar reflexes are down-going bilaterally. DTRs are symmetrical throughout. Sensory, motor and coordinative functions appear intact. Skin:  Intact without suspicious lesions or rashes Cervical Nodes:  no anterior cervical  adenopathy and no posterior cervical adenopathy.   Psych:  Oriented X3, memory intact for recent and remote, normally interactive, good eye contact, not anxious appearing, not depressed appearing, not agitated, not suicidal, and not homicidal.     Impression & Recommendations:  Problem # 1:  MAJOR DPRSV DISORDER RECURRENT EPISODE MODERATE (ICD-296.32) Assessment Unchanged  Problem # 2:  IBS (ICD-564.1) Assessment: Unchanged  Complete Medication List: 1)  Lorazepam 2 Mg Tabs (Lorazepam) .... Take 1 tab by mouth two times a day as needed for anxiety 2)  Ambien 10 Mg Tabs (Zolpidem tartrate) .... At bedtime 3)  Macrodantin 100 Mg Caps (Nitrofurantoin macrocrystal) .... As needed 4)  Oxybutynin Chloride 10 Mg Xr24h-tab (Oxybutynin chloride) 5)  Hydrocodone-acetaminophen 5-500 Mg Tabs (Hydrocodone-acetaminophen) .... One by mouth three times a day as needed hip pain 6)  Benefiber  7)  Promethazine Hcl 12.5 Mg Tabs (Promethazine hcl) .Marland Kitchen.. 1 every 6 hours as needed 8)  Trazodone Hcl 50 Mg Tabs (Trazodone hcl) .Marland Kitchen.. 1-2 by mouth at bedtime for insomnia 9)  Mirtazapine 15 Mg Tabs (Mirtazapine) .... One by mouth at bedtime for depression 10)  Phendimetrazine Tartrate 105 Mg Xr24h-cap (Phendimetrazine tartrate) .... One by mouth once daily as needed  Patient Instructions: 1)  Please schedule a follow-up appointment in 2 months.

## 2011-01-25 NOTE — Progress Notes (Signed)
Summary: TRIAGE-constipation/diarrhea, occ. rectal bleeding   Phone Note Call from Patient Call back at [phone]   Caller: Patient Call For: brodie Reason for Call: Talk to Nurse Summary of Call: pt state she has a lot of probs with bms diarrhea constipation rectal bleeding Initial call taken by: Tawni Levy,  July 24, 2008 1:05 PM  Follow-up for Phone Call        Pt. c/o bloating, goes between diarrhea and constipation. Rectal bleeding for more than 1 month, usually when she strains to have a BM. No rectal pain. Pt. states,"I just called to get an appointment." 1) Appt. with Dr.Brodie on 08-21-08 at 10:45am 2) Pt. instructed to call back as needed.  Follow-up by: Laureen Ochs LPN,  July 24, 2008 2:42 PM

## 2011-01-25 NOTE — Assessment & Plan Note (Signed)
Summary: loose watery stool/Sherri Weber    History of Present Illness Visit Type: follow up Primary GI MD: Lina Sar MD Primary Provider: [facility] Chief Complaint: Pt c/o diarrhea incontinent of stool History of Present Illness:   77 YO. SHE C/O DIARRHEA OFF AND ON FOR THE LAST SEVERAL MONTHS FEMALE KNOWN TO DR Juanda Chance, WHO WAS LAST SEEN ABOUT 2 WEEKS AGO FOR  SAME SXS.  SHE C/O DIARRHEA OFF AND ON FOR THE PAST SEVERAL MONTHS. SHE FEELS IT IS HORRIBLE BECAUSE IT IS UNPREDICTABLE. SHE MAY GO A WE EK OR MORE WITH NO DIARRHEA BUT DOESNT FEEL HER STOOLS ARE EVER NORMAL .ON BAD DAYS SHE HAS URGENCY, AND 3-4 BMS /PER DAY. SHE HAS EXCESSIVE GAS,NO C/O ABD PAIN OR CRAMPING,APPETITE  OK,WT FAIRLY STABLE. SHE HAS NO ENERGY,AND IS BLOATED ACROSS UPPER ABD. SHE TRIED COLESTID BUT STOPPED IT DUE TO CONSTIPATION. SHE COMPLETED THE FLAGYL BUT DID NOT NOTICE ANY IMPROVEMENT.   GI Review of Systems    Reports belching and  bloating.     Location of  Abdominal pain: upper abdomen.    Denies chest pain, dysphagia with liquids, dysphagia with solids, loss of appetite, nausea, vomiting, vomiting blood, and  weight loss.      Reports diarrhea.     Denies black tarry stools, hemorrhoids, irritable bowel syndrome, jaundice, light color stool, liver problems, rectal bleeding, and  rectal pain.     Updated Prior Medication List: LORAZEPAM 1 MG TABS (LORAZEPAM) once daily AMBIEN 10 MG TABS (ZOLPIDEM TARTRATE) at bedtime MACRODANTIN 100 MG CAPS (NITROFURANTOIN MACROCRYSTAL) as needed COLESTID 1 GM TABS (COLESTIPOL HCL) Take 2 tablets by mouth two times a day PHENDIMETRAZINE TARTRATE 105 MG XR24H-CAP (PHENDIMETRAZINE TARTRATE) Take 1 tablet by mouth every morning.  Current Allergies (reviewed today): No known allergies   Past Medical History:    Reviewed history from 04/22/2008 and no changes required:       Current Problems:        HEMORRHOIDS, INTERNAL (ICD-455.0)       CONSTIPATION, CHRONIC (ICD-564.09)  CARCINOMA, COLON, FAMILY HX (ICD-V16.0)       PROLAPSE, RECTAL (ICD-569.1)       ULCER, RECTUM (ICD-569.41)       IBS (ICD-564.1)         Past Surgical History:    Reviewed history from 08/20/2008 and no changes required:       Cholecystectomy       Hysterectomy       Appendectomy -1998       S/P RIGHT HEMICOLECTOMY,CECECTOMY   Family History:    Reviewed history from 08/20/2008 and no changes required:       Family History of Colon Cancer: Sister       Family History of Diabetes: Sister  Social History:    Reviewed history from 08/20/2008 and no changes required:       Occupation: Retired       Alcohol Use - yes-socially       Illicit Drug Use - no    Review of Systems       The patient complains of depression-new.         ROS ENTIRELY NEGATIVE EXCEPT GI AS ABOVE   Vital Signs:  Patient Profile:   78 Years Old Female Height:     67 inches Weight:      135.50 pounds BMI:     21.30 Pulse rate:   88 / minute Pulse rhythm:   regular BP sitting:   138 /  70  (right arm)  Vitals Entered By: Christie Nottingham CMA (September 09, 2008 1:34 PM)                  Physical Exam  General:     WDWF IN NAD. HEENT; NORMOCEPHALIC,NO ICTERUS,NO JVD . CV; RRR,NO MRG, PULM; CLEAR, ABD;SOFT MILD UPPER ABD ,TENDERNESS, NO MASS OR HSM,NO GUARDING ,BS ACTIVE, RECTAL; HEME,+ NO STOOL IN VAULT, EXT; NO C/C/E    Impression & Recommendations:  Problem # 1:  DIARRHEA (ICD-787.91) PROBABLY POST CHOLECYSTECTOMY BILE SALT INDUCED, NO RESPONSE TO TRIAL OF TREATMENT FOR BACT. OVERGROWTH.  TRIAL OF ALIGN ONE DAILY RESTART COLESTID AT HALF THE ORIGINAL DOSE( TAKE 2 PILLS ONCE DAILY) LOW GAS DIET STOP ARTIFICIAL SWEETENERS FOLLOW UP WITH DR Juanda Chance IN 3-4 WEEKS  Problem # 2:  GAS/BLOATING PLAN AS ABOVE   Patient Instructions: 1)  Excessive Gas Diet handout given. 2)  Take 1 Align Probiotic daily. 3)  Avoid artificial sweetners. 4)  Make appointment to see Dr Lina Sar in 4-6  weeks. 5)  Stay on colestid but decrease dose to once daily.    ]

## 2011-01-25 NOTE — Assessment & Plan Note (Signed)
Summary: 1 mo f/u // cd   Vital Signs:  Patient Profile:   77 Years Old Female Height:     66 inches Weight:      141 pounds Temp:     97.1 degrees F oral Pulse rate:   80 / minute Pulse rhythm:   regular BP sitting:   126 / 80  (left arm) Cuff size:   regular  Vitals Entered By: Rock Nephew CMA (December 04, 2008 10:08 AM)                 PCP:  [name] MD  Chief Complaint:  80mo follow up.  History of Present Illness: Pt. reports today for refills and to discuss stopping Zoloft. She doesn't think it helps her much and she is unable to cry since she started taking it. She is struggling with her daughter in [place] who is addicted to pain pills and doesn't return her calls.  Depression History:      Positive alarm features for depression include insomnia.  However, she denies significant weight loss, significant weight gain, hypersomnia, psychomotor agitation, psychomotor retardation, fatigue (loss of energy), feelings of worthlessness (guilt), impaired concentration (indecisiveness), and recurrent thoughts of death or suicide.  Positive alarm features for a manic disorder include distractibility.  She denies persistently & abnormally elevated mood, abnormally & persistently irritable mood, less need for sleep, talkative or feels need to keep talking, flight of ideas, increase in goal-directed activity, psychomotor agitation, inflated self-esteem or grandiosity, excessive buying sprees, excessive sexual indiscretions, and excessive foolish business investments.        Psychosocial stress factors include major life changes.  The patient denies that she feels like life is not worth living, denies that she wishes that she were dead, and denies that she has thought about ending her life.           Prior Medication List:  LORAZEPAM 2 MG TABS (LORAZEPAM) Take 1 tab by mouth at bedtime AMBIEN 10 MG TABS (ZOLPIDEM TARTRATE) at bedtime MACRODANTIN 100 MG CAPS (NITROFURANTOIN  MACROCRYSTAL) as needed COLESTID 1 GM TABS (COLESTIPOL HCL) Take 2 tablets by mouth two times a day GABAPENTIN 100 MG CAPS (GABAPENTIN) Take 1 tablet by mouth three times a day OXYBUTYNIN CHLORIDE 10 MG XR24H-TAB (OXYBUTYNIN CHLORIDE)  ZOLOFT 100 MG TABS (SERTRALINE HCL) Take 1 tablet by mouth once a day HYDROCODONE-ACETAMINOPHEN 5-500 MG TABS (HYDROCODONE-ACETAMINOPHEN) as needed   Updated Prior Medication List: LORAZEPAM 2 MG TABS (LORAZEPAM) Take 1 tab by mouth at bedtime AMBIEN 10 MG TABS (ZOLPIDEM TARTRATE) at bedtime MACRODANTIN 100 MG CAPS (NITROFURANTOIN MACROCRYSTAL) as needed COLESTID 1 GM TABS (COLESTIPOL HCL) Take 2 tablets by mouth two times a day GABAPENTIN 100 MG CAPS (GABAPENTIN) Take 1 tablet by mouth three times a day OXYBUTYNIN CHLORIDE 10 MG XR24H-TAB (OXYBUTYNIN CHLORIDE)  ZOLOFT 100 MG TABS (SERTRALINE HCL) Take 1 tablet by mouth once a day HYDROCODONE-ACETAMINOPHEN 5-500 MG TABS (HYDROCODONE-ACETAMINOPHEN) as needed  Current Allergies (reviewed today): No known allergies   Past Medical History:    Reviewed history from 04/22/2008 and no changes required:       Current Problems:        HEMORRHOIDS, INTERNAL (ICD-455.0)       CONSTIPATION, CHRONIC (ICD-564.09)       CARCINOMA, COLON, FAMILY HX (ICD-V16.0)       PROLAPSE, RECTAL (ICD-569.1)       ULCER, RECTUM (ICD-569.41)       IBS (ICD-564.1)  Past Surgical History:    Reviewed history from 09/09/2008 and no changes required:       Cholecystectomy       Hysterectomy       Appendectomy -1998       S/P RIGHT HEMICOLECTOMY,CECECTOMY   Family History:    Reviewed history from 08/20/2008 and no changes required:       Family History of Colon Cancer: Sister       Family History of Diabetes: Sister  Social History:    Reviewed history from 11/06/2008 and no changes required:       Occupation: Retired       Illicit Drug Use - no       Never Smoked       Regular exercise-no       Alcohol  use-no   Risk Factors: Tobacco use:  never Passive smoke exposure:  no Drug use:  no HIV high-risk behavior:  no Alcohol use:  no Exercise:  no  Family History Risk Factors:    Family History of MI in females < 64 years old:  no    Family History of MI in males < 21 years old:  no   Review of Systems  The patient denies anorexia, fever, weight loss, weight gain, chest pain, syncope, peripheral edema, prolonged cough, headaches, hemoptysis, abdominal pain, hematuria, depression, and enlarged lymph nodes.    Psych      Denies alternate hallucination ( auditory/visual), anxiety, depression, easily angered, easily tearful, irritability, mental problems, panic attacks, sense of great danger, suicidal thoughts/plans, thoughts of violence, unusual visions or sounds, and thoughts /plans of harming others.   Physical Exam  General:     alert, well-developed, well-nourished, well-hydrated, appropriate dress, normal appearance, and healthy-appearing.   Eyes:     No corneal or conjunctival inflammation noted. EOMI. Perrla. Funduscopic exam benign, without hemorrhages, exudates or papilledema. Vision grossly normal. Mouth:     Oral mucosa and oropharynx without lesions or exudates.  Teeth in good repair. Neck:     supple, full ROM, no masses, no thyromegaly, and no thyroid nodules or tenderness.   Lungs:     Normal respiratory effort, chest expands symmetrically. Lungs are clear to auscultation, no crackles or wheezes. Heart:     Normal rate and regular rhythm. S1 and S2 normal without gallop, murmur, click, rub or other extra sounds. Abdomen:     soft, non-tender, normal bowel sounds, no distention, no masses, no guarding, no rigidity, no rebound tenderness, no abdominal hernia, no inguinal hernia, no hepatomegaly, and no splenomegaly.   Msk:     No deformity or scoliosis noted of thoracic or lumbar spine.   Skin:     turgor normal, color normal, no rashes, no suspicious lesions, no  ecchymoses, no petechiae, no purpura, no ulcerations, and no edema.   Cervical Nodes:     No lymphadenopathy noted Psych:     Oriented X3, memory intact for recent and remote, normally interactive, good eye contact, not anxious appearing, not depressed appearing, and not agitated.      Impression & Recommendations:  Problem # 1:  DEPRESSIVE DISORDER (ICD-311) Assessment: Unchanged we discussed a zoloft taper today. Her updated medication list for this problem includes:    Lorazepam 2 Mg Tabs (Lorazepam) .Marland Kitchen... Take 1 tab by mouth at bedtime    Zoloft 100 Mg Tabs (Sertraline hcl) .Marland Kitchen... Take 1 tablet by mouth once a day   Problem # 2:  FATIGUE (ICD-780.79) Assessment: Improved  Problem # 3:  DIARRHEA (ICD-787.91) Assessment: Improved  Complete Medication List: 1)  Lorazepam 2 Mg Tabs (Lorazepam) .... Take 1 tab by mouth at bedtime 2)  Ambien 10 Mg Tabs (Zolpidem tartrate) .... At bedtime 3)  Macrodantin 100 Mg Caps (Nitrofurantoin macrocrystal) .... As needed 4)  Colestid 1 Gm Tabs (Colestipol hcl) .... Take 2 tablets by mouth two times a day 5)  Gabapentin 100 Mg Caps (Gabapentin) .... Take 1 tablet by mouth three times a day 6)  Oxybutynin Chloride 10 Mg Xr24h-tab (Oxybutynin chloride) 7)  Zoloft 100 Mg Tabs (Sertraline hcl) .... Take 1 tablet by mouth once a day 8)  Hydrocodone-acetaminophen 5-500 Mg Tabs (Hydrocodone-acetaminophen) .... As needed   Patient Instructions: 1)  Please schedule a follow-up appointment in 1 month. 2)  It is important that you exercise regularly at least 20 minutes 5 times a week. If you develop chest pain, have severe difficulty breathing, or feel very tired , stop exercising immediately and seek medical attention.   Prescriptions: HYDROCODONE-ACETAMINOPHEN 5-500 MG TABS (HYDROCODONE-ACETAMINOPHEN) as needed  #30 x 3   Entered and Authorized by:   [name] MD   Signed by:   [name] MD on 12/04/2008   Method used:   Print then  Give to Patient   RxID:   [id number] AMBIEN 10 MG TABS (ZOLPIDEM TARTRATE) at bedtime  #30 x 3   Entered and Authorized by:   [name] MD   Signed by:   [name] MD on 12/04/2008   Method used:   Print then Give to Patient   RxID:   [id number]  ]  Appended Document: 1 mo f/u // cd As billing provider, I have reviewed this document.

## 2011-01-25 NOTE — Letter (Signed)
Summary: [facility]enterology/[facility]  Gastroenterology/[facility]   Imported By: Sherian Rein 03/19/2010 10:18:42  _____________________________________________________________________  External Attachment:    Type:   Image     Comment:   External Document

## 2011-01-25 NOTE — Progress Notes (Signed)
Summary: REQ FOR RX  Phone Note Call from Patient Call back at Home Phone [phone]   Summary of Call: Pt c/o "pulled muscle" on the right side of her neck. Patient is requesting rx for muscle relaxor w/o office visit? Please advise.  Initial call taken by: Lamar Sprinkles, CMA,  February 15, 2010 9:46 AM  Follow-up for Phone Call        done Follow-up by: [name] MD,  February 15, 2010 10:11 AM  Additional Follow-up for Phone Call Additional follow up Details #1::        LMOVM for pt to check with pharmacy Additional Follow-up by: Rock Nephew CMA,  February 15, 2010 10:51 AM    New/Updated Medications: AMRIX 15 MG XR24H-CAP (CYCLOBENZAPRINE HCL) One by mouth once daily for muscle spasms Prescriptions: AMRIX 15 MG XR24H-CAP (CYCLOBENZAPRINE HCL) One by mouth once daily for muscle spasms  #15 x 1   Entered and Authorized by:   [name] MD   Signed by:   [name] MD on 02/15/2010   Method used:   Electronically to        [facility] [phone]* (retail)       [address] [place], [place]  [address]       Ph: [phone]       Fax: [phone]   RxID:   [id number]

## 2011-01-25 NOTE — Letter (Signed)
Summary: Lipid Letter  [facility]  [address] [place], [place] [address]   Phone: [phone]  Fax: [phone]    06/03/2009  Sherri Weber [address] [place], [place]  [address]  Dear Sherri Weber:  We have carefully reviewed your last lipid profile from 06/02/2009 and the results are noted below with a summary of recommendations for lipid management.    Cholesterol:       186     Goal: <200   HDL "good" Cholesterol:   56.38     Goal: >40   LDL "bad" Cholesterol:   122     Goal: <130   Triglycerides:       167.0     Goal: <150    the HDL and triglycerides are a little too high    TLC Diet (Therapeutic Lifestyle Change): Saturated Fats & Transfatty acids should be kept < 7% of total calories ***Reduce Saturated Fats Polyunstaurated Fat can be up to 10% of total calories Monounsaturated Fat Fat can be up to 20% of total calories Total Fat should be no greater than 25-35% of total calories Carbohydrates should be 50-60% of total calories Protein should be approximately 15% of total calories Fiber should be at least 20-30 grams a day ***Increased fiber may help lower LDL Total Cholesterol should be < 200mg /day Consider adding plant stanol/sterols to diet (example: Benacol spread) ***A higher intake of unsaturated fat may reduce Triglycerides and Increase HDL    Adjunctive Measures (may lower LIPIDS and reduce risk of Heart Attack) include: Aerobic Exercise (20-30 minutes 3-4 times a week) Limit Alcohol Consumption Weight Reduction Aspirin 75-81 mg a day by mouth (if not allergic or contraindicated) Dietary Fiber 20-30 grams a day by mouth     Current Medications: 1)    Lorazepam 2 Mg Tabs (Lorazepam) .... Take 1 tab by mouth two times a day as needed for anxiety 2)    Ambien 10 Mg Tabs (Zolpidem tartrate) .... At bedtime 3)    Macrodantin 100 Mg Caps (Nitrofurantoin macrocrystal) .... As needed 4)    Colestid 1 Gm Tabs (Colestipol hcl)  .... Take 2 tablets by mouth two times a day 5)    Gabapentin 100 Mg Caps (Gabapentin) .... Take 1 tablet by mouth three times a day 6)    Oxybutynin Chloride 10 Mg Xr24h-tab (Oxybutynin chloride) 7)    Hydrocodone-acetaminophen 5-500 Mg Tabs (Hydrocodone-acetaminophen) .... One by mouth three times a day as needed hip pain 8)    Benefiber  9)    Promethazine Hcl 12.5 Mg Tabs (Promethazine hcl) .Marland Kitchen.. 1 every 6 hours as needed 10)    Trazodone Hcl 50 Mg Tabs (Trazodone hcl) .Marland Kitchen.. 1-2 by mouth at bedtime for insomnia 11)    Anucort-hc 25 Mg Supp (Hydrocortisone acetate) .... Insert 1 suppository into rectum at bedtime 12)    Phendimetrazine 105 Mg  .... Take 1 tablet by mouth every morning 13)    Restoril 30 Mg Caps (Temazepam) .... Take 1 tablet by mouth at bedtime as needed for sleep. 14)    Pristiq 50 Mg Xr24h-tab (Desvenlafaxine succinate) .... Once daily  If you have any questions, please call. We appreciate being able to work with you.   Sincerely,    [facility] [name] MD

## 2011-01-25 NOTE — Assessment & Plan Note (Signed)
Summary: 2 mos f/u# /cd   Vital Signs:  Patient profile:   77 year old female Menstrual status:  regular Height:      66 inches Weight:      132 pounds BMI:     21.38 O2 Sat:      96 % on Room air Temp:     98.2 degrees F oral Pulse rate:   83 / minute Pulse rhythm:   regular Resp:     16 per minute BP sitting:   130 / 80  (left arm) Cuff size:   large  Vitals Entered By: Rock Nephew CMA (May 27, 2010 10:03 AM)  O2 Flow:  Room air CC: follow-up visit 2mos Is Patient Diabetic? No Pain Assessment Patient in pain? no        Primary Care Provider:  Sanda Linger, MD  CC:  follow-up visit 2mos.  History of Present Illness: She returns for f/up and she says that she had surgery one month ago at [facility] for rectal prolapse and she feels like it was a success.She has [facility] helping her with dressing changes. She wants to start diet pills again b/c she is gaining weight.  Preventive Screening-Counseling & Management  Alcohol-Tobacco     Alcohol drinks/day: 0     Smoking Status: never     Passive Smoke Exposure: no  Caffeine-Diet-Exercise     PHQ-9 Score: 1-4 minimal depression     Depression Counseling: not indicated; screening negative for depression  Hep-HIV-STD-Contraception     Hepatitis Risk: no risk noted     HIV Risk: no     STD Risk: no risk noted      Sexual History:  Not active.        Drug Use:  never.        Blood Transfusions:  no.    Medications Prior to Update: 1)  Lorazepam 2 Mg Tabs (Lorazepam) .... Take 1 Tab By Mouth Two Times A Day As Needed For Anxiety 2)  Ambien 10 Mg Tabs (Zolpidem Tartrate) .... At Bedtime 3)  Macrodantin 100 Mg Caps (Nitrofurantoin Macrocrystal) .... As Needed 4)  Oxybutynin Chloride 10 Mg Xr24h-Tab (Oxybutynin Chloride) 5)  Hydrocodone-Acetaminophen 5-500 Mg Tabs (Hydrocodone-Acetaminophen) .... One By Mouth Three Times A Day As Needed Hip Pain 6)  Benefiber 7)  Promethazine Hcl 12.5 Mg Tabs (Promethazine Hcl)  .Marland Kitchen.. 1 Every 6 Hours As Needed 8)  Trazodone Hcl 50 Mg Tabs (Trazodone Hcl) .Marland Kitchen.. 1-2 By Mouth At Bedtime For Insomnia 9)  Robaxin 500 Mg Tabs (Methocarbamol) .... One By Mouth Three Times A Day As Needed For Muscle Spasm 10)  Mirtazapine 15 Mg Tabs (Mirtazapine) .... One By Mouth At Bedtime For Depression  Current Medications (verified): 1)  Lorazepam 2 Mg Tabs (Lorazepam) .... Take 1 Tab By Mouth Two Times A Day As Needed For Anxiety 2)  Ambien 10 Mg Tabs (Zolpidem Tartrate) .... At Bedtime 3)  Macrodantin 100 Mg Caps (Nitrofurantoin Macrocrystal) .... As Needed 4)  Oxybutynin Chloride 10 Mg Xr24h-Tab (Oxybutynin Chloride) 5)  Hydrocodone-Acetaminophen 5-500 Mg Tabs (Hydrocodone-Acetaminophen) .... One By Mouth Three Times A Day As Needed Hip Pain 6)  Benefiber 7)  Promethazine Hcl 12.5 Mg Tabs (Promethazine Hcl) .Marland Kitchen.. 1 Every 6 Hours As Needed 8)  Trazodone Hcl 50 Mg Tabs (Trazodone Hcl) .Marland Kitchen.. 1-2 By Mouth At Bedtime For Insomnia 9)  Mirtazapine 15 Mg Tabs (Mirtazapine) .... One By Mouth At Bedtime For Depression 10)  Phendimetrazine Tartrate 105 Mg Xr24h-Cap (Phendimetrazine Tartrate) .Marland KitchenMarland KitchenMarland Kitchen  One By Mouth Once Daily As Needed  Allergies (verified): No Known Drug Allergies  Past History:  Past Medical History: Last updated: 06/02/2009 DEPRESSIVE DISORDER (ICD-311) DIARRHEA (ICD-787.91) FATIGUE (ICD-780.79) DIARRHEA (ICD-787.91) ABDOMINAL DISTENSION (ICD-787.3) HEMORRHOIDS, INTERNAL (ICD-455.0) CONSTIPATION, CHRONIC (ICD-564.09) CARCINOMA, COLON, FAMILY HX (ICD-V16.0) PROLAPSE, RECTAL (ICD-569.1) ULCER, RECTUM (ICD-569.41) IBS (ICD-564.1) Osteoarthritis Low back pain  Past Surgical History: Last updated: 09/09/2008 Cholecystectomy Hysterectomy Appendectomy -1998 S/P RIGHT HEMICOLECTOMY,CECECTOMY  Family History: Last updated: 08/20/2008 Family History of Colon Cancer: Sister Family History of Diabetes: Sister  Risk Factors: Alcohol Use: 0 (05/27/2010) Exercise: no  (11/06/2008)  Risk Factors: Smoking Status: never (05/27/2010) Passive Smoke Exposure: no (05/27/2010)  Social History: Hepatitis Risk:  no risk noted STD Risk:  no risk noted Sexual History:  Not active Drug Use:  never Blood Transfusions:  no  Review of Systems       The patient complains of weight gain.  The patient denies anorexia, fever, weight loss, hoarseness, chest pain, syncope, dyspnea on exertion, peripheral edema, prolonged cough, headaches, hemoptysis, abdominal pain, melena, hematochezia, severe indigestion/heartburn, hematuria, muscle weakness, suspicious skin lesions, abnormal bleeding, enlarged lymph nodes, and angioedema.   GU:  Complains of dysuria and urinary frequency; denies abnormal vaginal bleeding, hematuria, incontinence, nocturia, and urinary hesitancy. Psych:  Complains of anxiety, easily angered, and irritability; denies alternate hallucination ( auditory/visual), depression, easily tearful, mental problems, panic attacks, sense of great danger, suicidal thoughts/plans, thoughts of violence, unusual visions or sounds, and thoughts /plans of harming others. Endo:  Complains of weight change; denies cold intolerance, excessive hunger, excessive thirst, excessive urination, heat intolerance, and polyuria.  Physical Exam  General:  alert and oriented.well-developed, well-nourished, well-hydrated, appropriate dress, normal appearance, healthy-appearing, cooperative to examination, and good hygiene.   Head:  normocephalic and atraumatic.   Mouth:  no gingival abnormalities, pharynx pink and moist, no erythema, and no exudates.   Neck:  supple, full ROM, no masses, no thyromegaly, no carotid bruits, no cervical lymphadenopathy, and no neck tenderness.   Lungs:  normal respiratory effort, no intercostal retractions, no accessory muscle use, normal breath sounds, no dullness, no fremitus, no crackles, and no wheezes.   Heart:  Regular rate and rhythm; no murmurs, rubs  or bruits. Abdomen:  soft abdomen with normal active bowel sounds, decreased muscle tone. Liver edge at costal margin. No palpable mass or tenderness. Msk:  normal ROM, no joint tenderness, no joint swelling, no joint warmth, no redness over joints, no joint deformities, no joint instability, no crepitation, and no muscle atrophy.   Pulses:  R and L carotid,radial,femoral,dorsalis pedis and posterior tibial pulses are full and equal bilaterally Extremities:  No clubbing, cyanosis, edema, or deformity noted with normal full range of motion of all joints.   Neurologic:  No cranial nerve deficits noted. Station and gait are normal. Plantar reflexes are down-going bilaterally. DTRs are symmetrical throughout. Sensory, motor and coordinative functions appear intact. Skin:  Intact without suspicious lesions or rashes Cervical Nodes:  no anterior cervical adenopathy and no posterior cervical adenopathy.   Axillary Nodes:  no R axillary adenopathy and no L axillary adenopathy.   Inguinal Nodes:  no R inguinal adenopathy and no L inguinal adenopathy.   Psych:  Oriented X3, memory intact for recent and remote, normally interactive, good eye contact, not anxious appearing, not depressed appearing, not agitated, not suicidal, and not homicidal.     Impression & Recommendations:  Problem # 1:  OBESITY (ICD-278.00) Assessment New restart phendimetrizine Ht: 66 (05/27/2010)  Wt: 129 (05/27/2010)   BMI: 20.90 (05/27/2010)  Problem # 2:  UTI (ICD-599.0) Assessment: Unchanged  Her updated medication list for this problem includes:    Macrodantin 100 Mg Caps (Nitrofurantoin macrocrystal) .Marland Kitchen... As needed    Oxybutynin Chloride 10 Mg Xr24h-tab (Oxybutynin chloride)  Orders: TLB-Udip w/ Micro (81001-URINE) T-Urine Culture (Spectrum Order) [id number])  Encouraged to push clear liquids, get enough rest, and take acetaminophen as needed. To be seen in 10 days if no improvement, sooner if worse.  Problem #  3:  MAJOR DPRSV DISORDER RECURRENT EPISODE MODERATE (ICD-296.32) Assessment: Improved  Orders: Prescription Created Electronically [id number])  Complete Medication List: 1)  Lorazepam 2 Mg Tabs (Lorazepam) .... Take 1 tab by mouth two times a day as needed for anxiety 2)  Ambien 10 Mg Tabs (Zolpidem tartrate) .... At bedtime 3)  Macrodantin 100 Mg Caps (Nitrofurantoin macrocrystal) .... As needed 4)  Oxybutynin Chloride 10 Mg Xr24h-tab (Oxybutynin chloride) 5)  Hydrocodone-acetaminophen 5-500 Mg Tabs (Hydrocodone-acetaminophen) .... One by mouth three times a day as needed hip pain 6)  Benefiber  7)  Promethazine Hcl 12.5 Mg Tabs (Promethazine hcl) .Marland Kitchen.. 1 every 6 hours as needed 8)  Trazodone Hcl 50 Mg Tabs (Trazodone hcl) .Marland Kitchen.. 1-2 by mouth at bedtime for insomnia 9)  Mirtazapine 15 Mg Tabs (Mirtazapine) .... One by mouth at bedtime for depression 10)  Phendimetrazine Tartrate 105 Mg Xr24h-cap (Phendimetrazine tartrate) .... One by mouth once daily as needed  Patient Instructions: 1)  Please schedule a follow-up appointment in 2 months. 2)  It is important that you exercise regularly at least 20 minutes 5 times a week. If you develop chest pain, have severe difficulty breathing, or feel very tired , stop exercising immediately and seek medical attention. 3)  You need to lose weight. Consider a lower calorie diet and regular exercise.  Prescriptions: TRAZODONE HCL 50 MG TABS (TRAZODONE HCL) 1-2 by mouth at bedtime for insomnia  #60 x 11   Entered and Authorized by:   [name] MD   Signed by:   [name] MD on 05/27/2010   Method used:   Electronically to        [facility] [phone]* (retail)       [address] [place], [place]  [address]       Ph: [phone]       Fax: [phone]   RxID:   [id number] PHENDIMETRAZINE TARTRATE 105 MG XR24H-CAP (PHENDIMETRAZINE TARTRATE) One by mouth once daily as needed  #30 x 2   Entered and Authorized by:   [name] MD    Signed by:   [name] MD on 05/27/2010   Method used:   Print then Give to Patient   RxID:   [id number]

## 2011-01-25 NOTE — Progress Notes (Signed)
  Phone Note Call from Patient Call back at Home Phone [phone]   Caller: Patient Call For: [name] MD Reason for Call: Talk to Nurse Summary of Call: Left msg on vm requesting call abck. Called pt back no ansew LMOM RTC Initial call taken by: Orlan Leavens RMA,  December 02, 2010 1:55 PM  Follow-up for Phone Call        Pt called to say she has constipation and has been throwing up.Pt requests referral to Psychiatrist, she feels like she is "falling" apart. Please advise. Follow-up by: Verdell Face,  December 03, 2010 12:18 PM  Additional Follow-up for Phone Call Additional follow up Details #1::        Spoke w/pt. 1. Last BM was today - normal per her report.  C/o increase in weakness & fatigue. This am pt ate 1 piece of toast and then vomited dark brown/black. Advised ER - She refused. Only has had one episode of vomiting.  2. C/o increased stress in her family. She wants med to help w/this - she was not taking lorazepam. Pt will fill rx given in november.  3. Want referral to psychiatrist or psychologist to help deal with her stress.   Stressed again concern over dark vomit - refused ER again but agreed if anything changes. Additional Follow-up by: Lamar Sprinkles, CMA,  December 03, 2010 12:49 PM    Additional Follow-up for Phone Call Additional follow up Details #2::    er Follow-up by: [name] MD,  December 03, 2010 12:57 PM

## 2011-01-25 NOTE — Assessment & Plan Note (Signed)
Summary: legs shakey/loss of appetite/-lb   Vital Signs:  Patient profile:   77 year old female Menstrual status:  postmenopausal Height:      65.5 inches Weight:      133 pounds BMI:     21.87 O2 Sat:      97 % on Room air Temp:     97.6 degrees F oral Pulse rate:   77 / minute Pulse rhythm:   regular Resp:     16 per minute BP sitting:   110 / 62  (left arm) Cuff size:   large  Vitals Entered By: Rock Nephew CMA (November 11, 2010 1:09 PM)  O2 Flow:  Room air CC: Patient c/o bilateral leg weakness, light headed and fatigue Is Patient Diabetic? No  Does patient need assistance? Functional Status Self care Ambulation Normal   Primary Care Provider:  Sanda Linger, MD  CC:  Patient c/o bilateral leg weakness and light headed and fatigue.  History of Present Illness: She returns for f/up and she tells that after 2-3 days of taking the HCTZ her edema resolved but she also started feeling weak and wobbly in her legs. She feels ataxic after taking a few steps. The weakness is symmetrical in both legs and she goes not have any numbness or tingling. She has no N/W/T in her arms or face.  Preventive Screening-Counseling & Management  Alcohol-Tobacco     Alcohol drinks/day: 0     Alcohol Counseling: not indicated; patient does not drink     Smoking Status: never     Passive Smoke Exposure: no     Tobacco Counseling: not indicated; no tobacco use  Hep-HIV-STD-Contraception     Hepatitis Risk: no risk noted     HIV Risk: no     STD Risk: no risk noted     Dental Visit-last 6 months yes     Dental Care Counseling: to seek dental care; no dental care within six months     SBE monthly: yes     SBE Education/Counseling: to perform regular SBE     Sun Exposure-Excessive: no      Sexual History:  not active.        Drug Use:  never.        Blood Transfusions:  no.    Clinical Review Panels:  Prevention   Last Mammogram:  ASSESSMENT: Negative - BI-RADS 1^MM DIGITAL  SCREENING (01/19/2010)   Last Colonoscopy:  Normal (11/09/2005)  Immunizations   Last Tetanus Booster:  given (01/26/2002)   Last Zoster Vaccine:  Zostavax (08/24/2010)  Lipid Management   Cholesterol:  186 (06/02/2009)   LDL (bad choesterol):  122 (06/02/2009)   HDL (good cholesterol):  30.90 (06/02/2009)  Diabetes Management   Creatinine:  0.9 (11/01/2010)  CBC   WBC:  5.1 (11/01/2010)   RBC:  3.74 (11/01/2010)   Hgb:  12.1 (11/01/2010)   Hct:  34.6 (11/01/2010)   Platelets:  258.0 (11/01/2010)   MCV  92.5 (11/01/2010)   MCHC  34.8 (11/01/2010)   RDW  13.2 (11/01/2010)   PMN:  78.3 (11/01/2010)   Lymphs:  13.0 (11/01/2010)   Monos:  6.1 (11/01/2010)   Eosinophils:  1.7 (11/01/2010)   Basophil:  0.9 (11/01/2010)  Complete Metabolic Panel   Glucose:  104 (11/01/2010)   Sodium:  140 (11/01/2010)   Potassium:  4.8 (11/01/2010)   Chloride:  106 (11/01/2010)   CO2:  29 (11/01/2010)   BUN:  19 (11/01/2010)   Creatinine:  0.9 (11/01/2010)  Albumin:  3.6 (11/01/2010)   Total Protein:  6.2 (11/01/2010)   Calcium:  9.4 (11/01/2010)   Total Bili:  0.4 (11/01/2010)   Alk Phos:  62 (11/01/2010)   SGPT (ALT):  12 (11/01/2010)   SGOT (AST):  16 (11/01/2010)   Medications Prior to Update: 1)  Lorazepam 2 Mg Tabs (Lorazepam) .... Take 1 Tab By Mouth Two Times A Day As Needed For Anxiety 2)  Ambien 10 Mg Tabs (Zolpidem Tartrate) .... At Bedtime 3)  Macrodantin 100 Mg Caps (Nitrofurantoin Macrocrystal) .... As Needed 4)  Oxybutynin Chloride 10 Mg Xr24h-Tab (Oxybutynin Chloride) 5)  Hydrocodone-Acetaminophen 5-500 Mg Tabs (Hydrocodone-Acetaminophen) .... One By Mouth Three Times A Day As Needed Hip Pain 6)  Benefiber 7)  Promethazine Hcl 12.5 Mg Tabs (Promethazine Hcl) .Marland Kitchen.. 1 Every 6 Hours As Needed 8)  Trazodone Hcl 50 Mg Tabs (Trazodone Hcl) .Marland Kitchen.. 1-2 By Mouth At Bedtime For Insomnia 9)  Mirtazapine 15 Mg Tabs (Mirtazapine) .... One By Mouth At Bedtime For Depression 10)   Phendimetrazine Tartrate 105 Mg Xr24h-Cap (Phendimetrazine Tartrate) .... One By Mouth Once Daily As Needed 11)  Colestipol Hcl 1 Gm Tabs (Colestipol Hcl) .... 2 By Mouth Two Times A Day 12)  Dexilant 60 Mg Cpdr (Dexlansoprazole) .... One By Mouth Once Daily As Needed For Heartburn 13)  Microzide 12.5 Mg Caps (Hydrochlorothiazide) .... One By Mouth Once Daily For Swelling and High Blood Pressure  Current Medications (verified): 1)  Lorazepam 2 Mg Tabs (Lorazepam) .... Take 1 Tab By Mouth Two Times A Day As Needed For Anxiety 2)  Ambien 10 Mg Tabs (Zolpidem Tartrate) .... At Bedtime 3)  Macrodantin 100 Mg Caps (Nitrofurantoin Macrocrystal) .... As Needed 4)  Oxybutynin Chloride 10 Mg Xr24h-Tab (Oxybutynin Chloride) 5)  Hydrocodone-Acetaminophen 5-500 Mg Tabs (Hydrocodone-Acetaminophen) .... One By Mouth Three Times A Day As Needed Hip Pain 6)  Benefiber 7)  Promethazine Hcl 12.5 Mg Tabs (Promethazine Hcl) .Marland Kitchen.. 1 Every 6 Hours As Needed 8)  Trazodone Hcl 50 Mg Tabs (Trazodone Hcl) .Marland Kitchen.. 1-2 By Mouth At Bedtime For Insomnia 9)  Mirtazapine 15 Mg Tabs (Mirtazapine) .... One By Mouth At Bedtime For Depression 10)  Phendimetrazine Tartrate 105 Mg Xr24h-Cap (Phendimetrazine Tartrate) .... One By Mouth Once Daily As Needed 11)  Colestipol Hcl 1 Gm Tabs (Colestipol Hcl) .... 2 By Mouth Two Times A Day 12)  Dexilant 60 Mg Cpdr (Dexlansoprazole) .... One By Mouth Once Daily As Needed For Heartburn  Allergies (verified): No Known Drug Allergies  Past History:  Past Medical History: Last updated: 06/02/2009 DEPRESSIVE DISORDER (ICD-311) DIARRHEA (ICD-787.91) FATIGUE (ICD-780.79) DIARRHEA (ICD-787.91) ABDOMINAL DISTENSION (ICD-787.3) HEMORRHOIDS, INTERNAL (ICD-455.0) CONSTIPATION, CHRONIC (ICD-564.09) CARCINOMA, COLON, FAMILY HX (ICD-V16.0) PROLAPSE, RECTAL (ICD-569.1) ULCER, RECTUM (ICD-569.41) IBS (ICD-564.1) Osteoarthritis Low back pain  Past Surgical History: Last updated:  09/09/2008 Cholecystectomy Hysterectomy Appendectomy -1998 S/P RIGHT HEMICOLECTOMY,CECECTOMY  Family History: Last updated: 08/20/2008 Family History of Colon Cancer: Sister Family History of Diabetes: Sister  Social History: Last updated: 11/06/2008 Occupation: Retired Illicit Drug Use - no Never Smoked Regular exercise-no Alcohol use-no  Risk Factors: Alcohol Use: 0 (11/11/2010) Exercise: yes (08/24/2010)  Risk Factors: Smoking Status: never (11/11/2010) Passive Smoke Exposure: no (11/11/2010)  Family History: Reviewed history from 08/20/2008 and no changes required. Family History of Colon Cancer: Sister Family History of Diabetes: Sister  Social History: Reviewed history from 11/06/2008 and no changes required. 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  The patient denies anorexia, fever, decreased hearing, chest pain, syncope, peripheral edema, abdominal pain, hematuria, suspicious skin lesions, depression, and enlarged lymph nodes.   CV:  Complains of fatigue and lightheadness; denies chest pain or discomfort, difficulty breathing at night, difficulty breathing while lying down, fainting, leg cramps with exertion, near fainting, palpitations, shortness of breath with exertion, and swelling of feet. Neuro:  Complains of poor balance and weakness; denies brief paralysis, difficulty with concentration, falling down, headaches, inability to speak, memory loss, numbness, seizures, sensation of room spinning, tingling, tremors, and visual disturbances. Psych:  Complains of anxiety and irritability; denies alternate hallucination ( auditory/visual), depression, easily angered, easily tearful, mental problems, panic attacks, sense of great danger, suicidal thoughts/plans, thoughts of violence, unusual visions or sounds, and thoughts  /plans of harming others.  Physical Exam  General:  alert, well-developed, well-nourished, well-hydrated, appropriate dress, normal appearance, healthy-appearing, and cooperative to examination.   Head:  normocephalic, atraumatic, no abnormalities observed, and no abnormalities palpated.   Eyes:  vision grossly intact, pupils equal, pupils round, and pupils reactive to light.   Mouth:  Oral mucosa and oropharynx without lesions or exudates.  Teeth in good repair. Neck:  supple, full ROM, no masses, no thyromegaly, no JVD, normal carotid upstroke, and no carotid bruits.   Lungs:  normal respiratory effort, no intercostal retractions, no accessory muscle use, normal breath sounds, no dullness, no fremitus, no crackles, and no wheezes.   Heart:  normal rate, regular rhythm, no murmur, no gallop, and no rub.   Abdomen:  soft, non-tender, normal bowel sounds, no distention, no masses, no guarding, no rigidity, no rebound tenderness, no abdominal hernia, no inguinal hernia, no hepatomegaly, and no splenomegaly.   Msk:  normal ROM, no joint tenderness, no joint swelling, no joint warmth, no redness over joints, no joint deformities, no joint instability, and no crepitation.   Pulses:  R and L carotid,radial,femoral,dorsalis pedis and posterior tibial pulses are full and equal bilaterally Extremities:  No clubbing, cyanosis, edema, or deformity noted with normal full range of motion of all joints.   Neurologic:  alert & oriented X3, cranial nerves II-XII intact, strength normal in all extremities, sensation intact to light touch, sensation intact to pinprick, gait normal, DTRs symmetrical and normal, finger-to-nose normal, heel-to-shin normal, toes down bilaterally on Babinski, and Romberg negative.   Skin:  turgor normal, color normal, no rashes, no suspicious lesions, no ecchymoses, no petechiae, no purpura, no ulcerations, and no edema.   Cervical Nodes:  no anterior cervical adenopathy and no posterior  cervical adenopathy.   Axillary Nodes:  no R axillary adenopathy and no L axillary adenopathy.   Psych:  Cognition and judgment appear intact. Alert and cooperative with normal attention span and concentration. No apparent delusions, illusions, hallucinations   Impression & Recommendations:  Problem # 1:  ATAXIA (ICD-781.3) Assessment New will check an MRI to see if she has had a CVA Orders: Radiology Referral (Radiology)  Problem # 2:  WEAKNESS (ICD-780.79) Assessment: New I think the HCTZ has taken her BP too low Orders: Radiology Referral (Radiology)  Problem # 3:  EDEMA- LOCALIZED (ICD-782.3) Assessment: Improved  The following medications were removed from the medication list:    Microzide 12.5 Mg Caps (Hydrochlorothiazide) ..... One by mouth once daily for swelling and high blood pressure  Problem # 4:  OSTEOARTHRITIS (ICD-715.90) Assessment: Unchanged  Her updated medication list for this problem includes:  Hydrocodone-acetaminophen 5-500 Mg Tabs (Hydrocodone-acetaminophen) ..... One by mouth three times a day as needed hip pain  Problem # 5:  MAJOR DPRSV DISORDER RECURRENT EPISODE MODERATE (ICD-296.32) Assessment: Unchanged  Problem # 6:  INSOMNIA (ICD-780.52) Assessment: Unchanged  Her updated medication list for this problem includes:    Ambien 10 Mg Tabs (Zolpidem tartrate) .Marland Kitchen... At bedtime  Discussed sleep hygiene.   Complete Medication List: 1)  Lorazepam 2 Mg Tabs (Lorazepam) .... Take 1 tab by mouth two times a day as needed for anxiety 2)  Ambien 10 Mg Tabs (Zolpidem tartrate) .... At bedtime 3)  Macrodantin 100 Mg Caps (Nitrofurantoin macrocrystal) .... As needed 4)  Oxybutynin Chloride 10 Mg Xr24h-tab (Oxybutynin chloride) 5)  Hydrocodone-acetaminophen 5-500 Mg Tabs (Hydrocodone-acetaminophen) .... One by mouth three times a day as needed hip pain 6)  Benefiber  7)  Promethazine Hcl 12.5 Mg Tabs (Promethazine hcl) .Marland Kitchen.. 1 every 6 hours as needed 8)   Trazodone Hcl 50 Mg Tabs (Trazodone hcl) .Marland Kitchen.. 1-2 by mouth at bedtime for insomnia 9)  Mirtazapine 15 Mg Tabs (Mirtazapine) .... One by mouth at bedtime for depression 10)  Phendimetrazine Tartrate 105 Mg Xr24h-cap (Phendimetrazine tartrate) .... One by mouth once daily as needed 11)  Colestipol Hcl 1 Gm Tabs (Colestipol hcl) .... 2 by mouth two times a day 12)  Dexilant 60 Mg Cpdr (Dexlansoprazole) .... One by mouth once daily as needed for heartburn  Patient Instructions: 1)  Please schedule a follow-up appointment in 2 weeks. Prescriptions: HYDROCODONE-ACETAMINOPHEN 5-500 MG TABS (HYDROCODONE-ACETAMINOPHEN) One by mouth three times a day as needed hip pain  #75 x 3   Entered and Authorized by:   [name] MD   Signed by:   [name] MD on 11/11/2010   Method used:   Print then Give to Patient   RxID:   [id number] AMBIEN 10 MG TABS (ZOLPIDEM TARTRATE) at bedtime  #30 x 5   Entered and Authorized by:   [name] MD   Signed by:   [name] MD on 11/11/2010   Method used:   Print then Give to Patient   RxID:   [id number] LORAZEPAM 2 MG TABS (LORAZEPAM) Take 1 tab by mouth two times a day as needed for anxiety  #60 x 5   Entered and Authorized by:   [name] MD   Signed by:   [name] MD on 11/11/2010   Method used:   Print then Give to Patient   RxID:   [id number]    Orders Added: 1)  Radiology Referral [Radiology] 2)  Est. Patient Level IV [06237]

## 2011-01-25 NOTE — Assessment & Plan Note (Signed)
Summary: 35M FU/YF    History of Present Illness Visit Type: Follow-up Visit Primary GI MD: Lina Sar MD Primary Thai Burgueno: Sheryle Spray Requesting Taiga Lupinacci: n/a Chief Complaint: 6 month f/u, also c/o insomnia and nausea History of Present Illness:   This is a 77 year old white female with a rectal ulcer, diarrhea and alternating constipation. Her main complaint today is nausea. Patient's last appointment with [place] was in October 2009. Patient had a repair of her rectal prolapse in April 2009. She does have a family history of colon cancer in her sister. Her last colonoscopy was done in 11/05 and confirmed the presence of a rectal ulcer. She will be due for a repeat colonoscopy in November 2010. Patient complains of insomnia today. She has a history of right hemicolectomy in 1998 for complicated appendicitis.   GI Review of Systems    Reports nausea.      Denies abdominal pain, acid reflux, belching, bloating, chest pain, dysphagia with liquids, dysphagia with solids, heartburn, loss of appetite, vomiting, vomiting blood, weight loss, and  weight gain.        Denies anal fissure, black tarry stools, change in bowel habit, constipation, diarrhea, diverticulosis, fecal incontinence, heme positive stool, hemorrhoids, irritable bowel syndrome, jaundice, light color stool, liver problems, rectal bleeding, and  rectal pain.    Current Medications (verified): 1)  Lorazepam 2 Mg Tabs (Lorazepam) .... Take 1 Tab By Mouth Two Times A Day As Needed For Anxiety 2)  Ambien 10 Mg Tabs (Zolpidem Tartrate) .... At Bedtime 3)  Macrodantin 100 Mg Caps (Nitrofurantoin Macrocrystal) .... As Needed 4)  Colestid 1 Gm Tabs (Colestipol Hcl) .... Take 2 Tablets By Mouth Two Times A Day 5)  Gabapentin 100 Mg Caps (Gabapentin) .... Take 1 Tablet By Mouth Three Times A Day 6)  Oxybutynin Chloride 10 Mg Xr24h-Tab (Oxybutynin Chloride) 7)  Hydrocodone-Acetaminophen 5-500 Mg Tabs (Hydrocodone-Acetaminophen) .... One  By Mouth Three Times A Day As Needed Hip Pain 8)  Benefiber 9)  Promethazine Hcl 12.5 Mg Tabs (Promethazine Hcl) .Marland Kitchen.. 1 Every 6 Hours As Needed 10)  Trazodone Hcl 50 Mg Tabs (Trazodone Hcl) .Marland Kitchen.. 1-2 By Mouth At Bedtime For Insomnia  Allergies (verified): No Known Drug Allergies  Past History:  Past Medical History:    DEPRESSIVE DISORDER (ICD-311)    DIARRHEA (ICD-787.91)    FATIGUE (ICD-780.79)    DIARRHEA (ICD-787.91)    ABDOMINAL DISTENSION (ICD-787.3)    HEMORRHOIDS, INTERNAL (ICD-455.0)    CONSTIPATION, CHRONIC (ICD-564.09)    CARCINOMA, COLON, FAMILY HX (ICD-V16.0)    PROLAPSE, RECTAL (ICD-569.1)    ULCER, RECTUM (ICD-569.41)    IBS (ICD-564.1)     (01/28/2009)  Past Surgical History:    Cholecystectomy    Hysterectomy    Appendectomy -1998    S/P RIGHT HEMICOLECTOMY,CECECTOMY (09/09/2008)  Family History:    Family History of Colon Cancer: Sister    Family History of Diabetes: Sister     (08/20/2008)  Social History:    Occupation: Retired    Illicit Drug Use - no    Never Smoked    Regular exercise-no    Alcohol use-no     (11/06/2008)  Family History:    Reviewed history from 08/20/2008 and no changes required:       Family History of Colon Cancer: Sister       Family History of Diabetes: Sister  Social History:    Reviewed history from 11/06/2008 and no changes required:       Occupation: Retired  Illicit Drug Use - no       Never Smoked       Regular exercise-no       Alcohol use-no  Review of Systems       The patient complains of sleeping problems.  The patient denies allergy/sinus, anemia, anxiety-new, arthritis/joint pain, back pain, blood in urine, breast changes/lumps, change in vision, confusion, cough, coughing up blood, depression-new, fainting, fatigue, fever, headaches-new, hearing problems, heart murmur, heart rhythm changes, itching, menstrual pain, muscle pains/cramps, night sweats, nosebleeds, pregnancy symptoms, shortness of  breath, skin rash, sore throat, swelling of feet/legs, swollen lymph glands, thirst - excessive , urination - excessive , urination changes/pain, urine leakage, vision changes, and voice change.    Vital Signs:  Patient profile:   77 year old female Height:      66 inches Weight:      137.50 pounds BMI:     22.27 BSA:     1.71 Pulse rate:   80 / minute Pulse rhythm:   regular BP sitting:   122 / 76  (left arm)  Vitals Entered By: Merri Ray CMA (April 08, 2009 11:48 AM)  Physical Exam  General:  alert and oriented. Neck:  Supple; no masses or thyromegaly. Lungs:  Clear throughout to auscultation. Heart:  Regular rate and rhythm; no murmurs, rubs or bruits. Abdomen:  soft abdomen with normal active bowel sounds, decreased muscle tone. Liver edge at costal margin. No palpable mass or tenderness. Rectal:  normal rectal tone with bright red blood per rectum. She is heme-positive. There was no pain on digital exam. Extremities:  No clubbing, cyanosis, edema or deformities noted.   Impression & Recommendations:  Problem # 1:  ULCER, RECTUM (ICD-569.41) persistent rectal ulcer likely related to rectal prolapse despite her having repair of the rectal prolapse in April 2009. She seems to be doing better with her bowel habits at this time. She will be due for a repeat colonoscopy in November 2010.  Problem # 2:  INSOMNIA (ICD-780.52) patient has tried Ambien without much improvement. She would like to try Restoril 30 mg at bedtime.  Problem # 3:  CARCINOMA, COLON, FAMILY HX (ICD-V16.0) positive family history of colon cancer in patient's sister. Patient is due for a colonoscopy in November 2010.  Patient Instructions: 1)  Refills for Anucort Suppositories 1 at bedtime  2)  Phendimetrazine 105 mg 1 qam given with 1 refill 3)  Restoril 30 mg 1 at bedtime as needed given 4)  recall colonoscopy November 2010 5)  Copy sent to : Dr Leitha Schuller  Prescriptions: ANUCORT-HC 25 MG SUPP  (HYDROCORTISONE ACETATE) Insert 1 suppository into rectum at bedtime  #12 x 3   Entered by:   Hortense Ramal CMA   Authorized by:   Hart Carwin   Signed by:   Hortense Ramal CMA on 04/08/2009   Method used:   Reprint   RxID:   [id number] RESTORIL 30 MG CAPS (TEMAZEPAM) Take 1 tablet by mouth at bedtime as needed for sleep.  #30 x 1   Entered by:   Hortense Ramal CMA   Authorized by:   Hart Carwin   Signed by:   Hortense Ramal CMA on 04/08/2009   Method used:   Printed then faxed to ...       [facility]  [phone]* (retail)       [address]       [place], [place]  [address]       Ph: [phone] or [phone]       Fax: [phone]   RxID:   [id number] PHENDIMETRAZINE 105 MG Take 1 tablet by mouth every morning  #30 x 1   Entered by:   Hortense Ramal CMA   Authorized by:   Hart Carwin   Signed by:   Hortense Ramal CMA on 04/08/2009   Method used:   Printed then faxed to ...       [facility]  [address]  [phone]* (retail)       [address]       [place], [place]  [address]       Ph: [phone] or [phone]       Fax: [phone]   RxID:   [id number] ANUCORT-HC 25 MG SUPP (HYDROCORTISONE ACETATE) Insert 1 suppository into rectum at bedtime  #12 x 1   Entered by:   Hortense Ramal CMA   Authorized by:   Hart Carwin   Signed by:   Hortense Ramal CMA on 04/08/2009   Method used:   Electronically to        [facility]  [phone]* (retail)       [address]       [place], [place]  [address]       Ph: [phone] or [phone]       Fax: [phone]   RxID:   [id number]  ([facility] sent to [facility] in [place], not [facility]. )

## 2011-01-25 NOTE — Letter (Signed)
[facility]: [facility]   Imported By: Lester [place] 10/09/2009 12:37:23  _____________________________________________________________________  External Attachment:    Type:   Image     Comment:   External Document

## 2011-01-25 NOTE — Assessment & Plan Note (Signed)
Summary: 1 MOS F/U $50 / CD   Vital Signs:  Patient Profile:   77 Years Old Female Height:     66 inches Weight:      141 pounds Temp:     97.9 degrees F oral Pulse rate:   80 / minute Pulse rhythm:   regular BP sitting:   120 / 78  (left arm) Cuff size:   regular  Vitals Entered By: Rock Nephew CMA (January 01, 2009 10:04 AM)                 PCP:  [name] MD  Chief Complaint:  follow up.  History of Present Illness: Pt reports that she took her last dose of Zoloft one week ago and other than  being bored and lonely she is doing well. She rarely takes lorazepam but occasionally has insomnia which she takes lorazepam for.  Depression History:      The patient denies a depressed mood most of the day but notes a diminished interest in her usual daily activities.  Positive alarm features for depression include significant weight gain and insomnia.  However, she denies significant weight loss, hypersomnia, psychomotor agitation, psychomotor retardation, fatigue (loss of energy), feelings of worthlessness (guilt), impaired concentration (indecisiveness), and recurrent thoughts of death or suicide.  The patient denies symptoms of a manic disorder including persistently & abnormally elevated mood, abnormally & persistently irritable mood, less need for sleep, talkative or feels need to keep talking, distractibility, flight of ideas, increase in goal-directed activity, psychomotor agitation, inflated self-esteem or grandiosity, excessive buying sprees, excessive sexual indiscretions, and excessive foolish business investments.        Psychosocial stress factors include major life changes.  Risk factors for depression include a personal history of depression.  The patient denies that she feels like life is not worth living, denies that she wishes that she were dead, and denies that she has thought about ending her life.           Prior Medications Reviewed Using: Patient  Recall  Updated Prior Medication List: LORAZEPAM 2 MG TABS (LORAZEPAM) Take 1 tab by mouth at bedtime AMBIEN 10 MG TABS (ZOLPIDEM TARTRATE) at bedtime MACRODANTIN 100 MG CAPS (NITROFURANTOIN MACROCRYSTAL) as needed COLESTID 1 GM TABS (COLESTIPOL HCL) Take 2 tablets by mouth two times a day GABAPENTIN 100 MG CAPS (GABAPENTIN) Take 1 tablet by mouth three times a day OXYBUTYNIN CHLORIDE 10 MG XR24H-TAB (OXYBUTYNIN CHLORIDE)  HYDROCODONE-ACETAMINOPHEN 5-500 MG TABS (HYDROCODONE-ACETAMINOPHEN) as needed  Current Allergies (reviewed today): No known allergies   Past Medical History:    Reviewed history from 04/22/2008 and no changes required:       Current Problems:        HEMORRHOIDS, INTERNAL (ICD-455.0)       CONSTIPATION, CHRONIC (ICD-564.09)       CARCINOMA, COLON, FAMILY HX (ICD-V16.0)       PROLAPSE, RECTAL (ICD-569.1)       ULCER, RECTUM (ICD-569.41)       IBS (ICD-564.1)         Past Surgical History:    Reviewed history from 09/09/2008 and no changes required:       Cholecystectomy       Hysterectomy       Appendectomy -1998       S/P RIGHT HEMICOLECTOMY,CECECTOMY   Family History:    Reviewed history from 08/20/2008 and no changes required:       Family History of Colon Cancer: Sister  Family History of Diabetes: Sister  Social History:    Reviewed history from 11/06/2008 and no changes required:       Occupation: Retired       Illicit Drug Use - no       Never Smoked       Regular exercise-no       Alcohol use-no   Risk Factors: Tobacco use:  never Passive smoke exposure:  no Drug use:  no HIV high-risk behavior:  no Alcohol use:  no Exercise:  no  Family History Risk Factors:    Family History of MI in females < 73 years old:  no    Family History of MI in males < 62 years old:  no   Review of Systems  The patient denies anorexia, fever, weight loss, chest pain, syncope, dyspnea on exertion, peripheral edema, prolonged cough, headaches,  hemoptysis, abdominal pain, hematuria, depression, abnormal bleeding, enlarged lymph nodes, angioedema, and breast masses.     Physical Exam  General:     alert, well-developed, well-nourished, and well-hydrated.   Mouth:     Oral mucosa and oropharynx without lesions or exudates.  Teeth in good repair. Neck:     supple, full ROM, no masses, and no thyromegaly.   Breasts:     No mass, nodules, thickening, tenderness, bulging, retraction, inflamation, nipple discharge or skin changes noted.   Lungs:     Normal respiratory effort, chest expands symmetrically. Lungs are clear to auscultation, no crackles or wheezes. Heart:     Normal rate and regular rhythm. S1 and S2 normal without gallop, murmur, click, rub or other extra sounds. Abdomen:     soft, non-tender, normal bowel sounds, no masses, no guarding, no rigidity, no rebound tenderness, no hepatomegaly, no splenomegaly, and abdominal scar(s).   Msk:     No deformity or scoliosis noted of thoracic or lumbar spine.   Pulses:     R and L carotid,radial,femoral,dorsalis pedis and posterior tibial pulses are full and equal bilaterally Extremities:     No clubbing, cyanosis, edema, or deformity noted with normal full range of motion of all joints.   Neurologic:     No cranial nerve deficits noted. Station and gait are normal. Plantar reflexes are down-going bilaterally. DTRs are symmetrical throughout. Sensory, motor and coordinative functions appear intact. Skin:     some senile purpura on forearms Cervical Nodes:     No lymphadenopathy noted Axillary Nodes:     No palpable lymphadenopathy Inguinal Nodes:     No significant adenopathy Psych:     Cognition and judgment appear intact. Alert and cooperative with normal attention span and concentration. No apparent delusions, illusions, hallucinations    Impression & Recommendations:  Problem # 1:  DEPRESSIVE DISORDER (ICD-311) Assessment: Improved  The following medications  were removed from the medication list:    Zoloft 100 Mg Tabs (Sertraline hcl) .Marland Kitchen... Take 1 tablet by mouth once a day  Her updated medication list for this problem includes:    Lorazepam 2 Mg Tabs (Lorazepam) .Marland Kitchen... Take 1 tab by mouth at bedtime   Problem # 2:  FATIGUE (ICD-780.79) Assessment: Improved  Complete Medication List: 1)  Lorazepam 2 Mg Tabs (Lorazepam) .... Take 1 tab by mouth at bedtime 2)  Ambien 10 Mg Tabs (Zolpidem tartrate) .... At bedtime 3)  Macrodantin 100 Mg Caps (Nitrofurantoin macrocrystal) .... As needed 4)  Colestid 1 Gm Tabs (Colestipol hcl) .... Take 2 tablets by mouth two times a day 5)  Gabapentin 100 Mg Caps (Gabapentin) .... Take 1 tablet by mouth three times a day 6)  Oxybutynin Chloride 10 Mg Xr24h-tab (Oxybutynin chloride) 7)  Hydrocodone-acetaminophen 5-500 Mg Tabs (Hydrocodone-acetaminophen) .... As needed   Patient Instructions: 1)  Please schedule a follow-up appointment in 1 month. 2)  It is important that you exercise regularly at least 20 minutes 5 times a week. If you develop chest pain, have severe difficulty breathing, or feel very tired , stop exercising immediately and seek medical attention. 3)  You need to lose weight. Consider a lower calorie diet and regular exercise.  4)  Schedule your mammogram.   Prescriptions: LORAZEPAM 2 MG TABS (LORAZEPAM) Take 1 tab by mouth at bedtime  #30 x 3   Entered and Authorized by:   [name] MD   Signed by:   [name] MD on 01/01/2009   Method used:   Print then Give to Patient   RxID:   [id number]  ]

## 2011-01-25 NOTE — Assessment & Plan Note (Signed)
Summary: issues to discuss/#?cd   Vital Signs:  Patient profile:   77 year old female Menstrual status:  postmenopausal Height:      65.5 inches Weight:      137 pounds BMI:     22.53 O2 Sat:      96 % on Room air Temp:     98.4 degrees F oral Pulse rate:   91 / minute Pulse rhythm:   regular Resp:     16 per minute BP sitting:   140 / 80  (left arm) Cuff size:   large  Vitals Entered By: Rock Nephew CMA (November 01, 2010 9:44 AM)  O2 Flow:  Room air CC: Patient c/o Swollen right foot x 2week, Heartburn Is Patient Diabetic? No Pain Assessment Patient in pain? no       Does patient need assistance? Functional Status Self care Ambulation Normal     Menstrual Status postmenopausal   Primary Care Provider:  Sanda Linger, MD  CC:  Patient c/o Swollen right foot x 2week and Heartburn.  History of Present Illness:  Heartburn      This is a 77 year old woman who presents with Heartburn.  The symptoms began 4-8 weeks ago.  The intensity is described as mild.  The patient reports acid reflux, but denies sour taste in mouth, epigastric pain, chest pain, trouble swallowing, weight loss, and weight gain.  The patient denies the following alarm features: melena, dysphagia, hematemesis, vomiting, involuntary weight loss >5%, and history of anemia.  Symptoms are worse with spicy foods.  Treatment that was tried and either found to be ineffective or stopped due to problems include dietary changes, weight loss, and elevating the head of bed.    Preventive Screening-Counseling & Management  Alcohol-Tobacco     Alcohol drinks/day: 0     Smoking Status: never     Passive Smoke Exposure: no     Tobacco Counseling: not indicated; no tobacco use  Hep-HIV-STD-Contraception     Hepatitis Risk: no risk noted     HIV Risk: no     STD Risk: no risk noted     Dental Visit-last 6 months yes     Dental Care Counseling: to seek dental care; no dental care within six months     SBE  monthly: yes     SBE Education/Counseling: to perform regular SBE     Sun Exposure-Excessive: no      Sexual History:  not active.        Drug Use:  never.        Blood Transfusions:  no.    Clinical Review Panels:  Prevention   Last Mammogram:  ASSESSMENT: Negative - BI-RADS 1^MM DIGITAL SCREENING (01/19/2010)   Last Colonoscopy:  Normal (11/09/2005)  Immunizations   Last Tetanus Booster:  given (01/26/2002)   Last Zoster Vaccine:  Zostavax (08/24/2010)  Lipid Management   Cholesterol:  186 (06/02/2009)   LDL (bad choesterol):  122 (06/02/2009)   HDL (good cholesterol):  30.90 (06/02/2009)  Diabetes Management   Creatinine:  1.0 (06/02/2009)  CBC   WBC:  5.8 (06/02/2009)   RBC:  4.04 (06/02/2009)   Hgb:  13.2 (06/02/2009)   Hct:  36.6 (06/02/2009)   Platelets:  278.0 (06/02/2009)   MCV  90.7 (06/02/2009)   MCHC  35.9 (06/02/2009)   RDW  11.9 (06/02/2009)   PMN:  72.8 (06/02/2009)   Lymphs:  16.1 (06/02/2009)   Monos:  5.9 (06/02/2009)   Eosinophils:  4.2 (  06/02/2009)   Basophil:  1.0 (06/02/2009)  Complete Metabolic Panel   Glucose:  86 (06/02/2009)   Sodium:  143 (06/02/2009)   Potassium:  4.4 (06/02/2009)   Chloride:  111 (06/02/2009)   CO2:  29 (06/02/2009)   BUN:  18 (06/02/2009)   Creatinine:  1.0 (06/02/2009)   Albumin:  4.0 (06/02/2009)   Total Protein:  6.9 (06/02/2009)   Calcium:  9.2 (06/02/2009)   Total Bili:  1.1 (06/02/2009)   Alk Phos:  50 (06/02/2009)   SGPT (ALT):  19 (06/02/2009)   SGOT (AST):  21 (06/02/2009)   Medications Prior to Update: 1)  Lorazepam 2 Mg Tabs (Lorazepam) .... Take 1 Tab By Mouth Two Times A Day As Needed For Anxiety 2)  Ambien 10 Mg Tabs (Zolpidem Tartrate) .... At Bedtime 3)  Macrodantin 100 Mg Caps (Nitrofurantoin Macrocrystal) .... As Needed 4)  Oxybutynin Chloride 10 Mg Xr24h-Tab (Oxybutynin Chloride) 5)  Hydrocodone-Acetaminophen 5-500 Mg Tabs (Hydrocodone-Acetaminophen) .... One By Mouth Three Times A Day  As Needed Hip Pain 6)  Benefiber 7)  Promethazine Hcl 12.5 Mg Tabs (Promethazine Hcl) .Marland Kitchen.. 1 Every 6 Hours As Needed 8)  Trazodone Hcl 50 Mg Tabs (Trazodone Hcl) .Marland Kitchen.. 1-2 By Mouth At Bedtime For Insomnia 9)  Mirtazapine 15 Mg Tabs (Mirtazapine) .... One By Mouth At Bedtime For Depression 10)  Phendimetrazine Tartrate 105 Mg Xr24h-Cap (Phendimetrazine Tartrate) .... One By Mouth Once Daily As Needed 11)  Colestipol Hcl 1 Gm Tabs (Colestipol Hcl) .... 2 By Mouth Two Times A Day  Current Medications (verified): 1)  Lorazepam 2 Mg Tabs (Lorazepam) .... Take 1 Tab By Mouth Two Times A Day As Needed For Anxiety 2)  Ambien 10 Mg Tabs (Zolpidem Tartrate) .... At Bedtime 3)  Macrodantin 100 Mg Caps (Nitrofurantoin Macrocrystal) .... As Needed 4)  Oxybutynin Chloride 10 Mg Xr24h-Tab (Oxybutynin Chloride) 5)  Hydrocodone-Acetaminophen 5-500 Mg Tabs (Hydrocodone-Acetaminophen) .... One By Mouth Three Times A Day As Needed Hip Pain 6)  Benefiber 7)  Promethazine Hcl 12.5 Mg Tabs (Promethazine Hcl) .Marland Kitchen.. 1 Every 6 Hours As Needed 8)  Trazodone Hcl 50 Mg Tabs (Trazodone Hcl) .Marland Kitchen.. 1-2 By Mouth At Bedtime For Insomnia 9)  Mirtazapine 15 Mg Tabs (Mirtazapine) .... One By Mouth At Bedtime For Depression 10)  Phendimetrazine Tartrate 105 Mg Xr24h-Cap (Phendimetrazine Tartrate) .... One By Mouth Once Daily As Needed 11)  Colestipol Hcl 1 Gm Tabs (Colestipol Hcl) .... 2 By Mouth Two Times A Day 12)  Dexilant 60 Mg Cpdr (Dexlansoprazole) .... One By Mouth Once Daily As Needed For Heartburn 13)  Microzide 12.5 Mg Caps (Hydrochlorothiazide) .... One By Mouth Once Daily For Swelling and High Blood Pressure  Allergies (verified): No Known Drug Allergies  Past History:  Past Medical History: Last updated: 06/02/2009 DEPRESSIVE DISORDER (ICD-311) DIARRHEA (ICD-787.91) FATIGUE (ICD-780.79) DIARRHEA (ICD-787.91) ABDOMINAL DISTENSION (ICD-787.3) HEMORRHOIDS, INTERNAL (ICD-455.0) CONSTIPATION, CHRONIC  (ICD-564.09) CARCINOMA, COLON, FAMILY HX (ICD-V16.0) PROLAPSE, RECTAL (ICD-569.1) ULCER, RECTUM (ICD-569.41) IBS (ICD-564.1) Osteoarthritis Low back pain  Past Surgical History: Last updated: 09/09/2008 Cholecystectomy Hysterectomy Appendectomy -1998 S/P RIGHT HEMICOLECTOMY,CECECTOMY  Family History: Last updated: 08/20/2008 Family History of Colon Cancer: Sister Family History of Diabetes: Sister  Social History: Last updated: 11/06/2008 Occupation: Retired Illicit Drug Use - no Never Smoked Regular exercise-no Alcohol use-no  Risk Factors: Alcohol Use: 0 (11/01/2010) Exercise: yes (08/24/2010)  Risk Factors: Smoking Status: never (11/01/2010) Passive Smoke Exposure: no (11/01/2010)  Family History: Reviewed history from 08/20/2008 and no changes required. Family History of Colon Cancer:  Sister Family History of Diabetes: Sister  Social History: Reviewed history from 11/06/2008 and no changes required. 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  The patient denies anorexia, fever, weight loss, weight gain, hoarseness, chest pain, syncope, dyspnea on exertion, prolonged cough, headaches, hemoptysis, abdominal pain, melena, hematochezia, hematuria, suspicious skin lesions, difficulty walking, depression, enlarged lymph nodes, and angioedema.    Physical Exam  General:  alert, well-developed, well-nourished, well-hydrated, appropriate dress, normal appearance, healthy-appearing, and cooperative to examination.   Mouth:  Oral mucosa and oropharynx without lesions or exudates.  Teeth in good repair. Neck:  supple and no masses.   Lungs:  normal respiratory effort and normal breath sounds.   Heart:  normal rate and regular rhythm.   Abdomen:  soft, non-tender, normal bowel sounds, no distention, no masses, no  guarding, no rigidity, no rebound tenderness, no abdominal hernia, no inguinal hernia, no hepatomegaly, and no splenomegaly.   Msk:  No deformity or scoliosis noted of thoracic or lumbar spine.  all toes are hammer toes. Pulses:  R and L carotid,radial,femoral,dorsalis pedis and posterior tibial pulses are full and equal bilaterally Extremities:  2+ left pedal edema and 2+ right pedal edema.   Neurologic:  No cranial nerve deficits noted. Station and gait are normal. Plantar reflexes are down-going bilaterally. DTRs are symmetrical throughout. Sensory, motor and coordinative functions appear intact. Skin:  Intact without suspicious lesions or rashes Cervical Nodes:  no anterior cervical adenopathy and no posterior cervical adenopathy.   Psych:  Cognition and judgment appear intact. Alert and cooperative with normal attention span and concentration. No apparent delusions, illusions, hallucinations   Impression & Recommendations:  Problem # 1:  ESSENTIAL HYPERTENSION (ICD-401.9) Assessment New  Her updated medication list for this problem includes:    Microzide 12.5 Mg Caps (Hydrochlorothiazide) ..... One by mouth once daily for swelling and high blood pressure  BP today: 140/80 Prior BP: 118/70 (10/05/2010)  Labs Reviewed: K+: 4.4 (06/02/2009) Creat: : 1.0 (06/02/2009)   Chol: 186 (06/02/2009)   HDL: 30.90 (06/02/2009)   LDL: 122 (06/02/2009)   TG: 167.0 (06/02/2009)  Problem # 2:  SWELLING, LIMB (ICD-729.81) Assessment: New will check for dvt and chf Orders: Venipuncture (91478) T-D-Dimer Fibrin Derivatives Quantitive ([id number]) TLB-BMP (Basic Metabolic Panel-BMET) (80048-METABOL) TLB-CBC Platelet - w/Differential (85025-CBCD) TLB-Hepatic/Liver Function Pnl (80076-HEPATIC) TLB-TSH (Thyroid Stimulating Hormone) (84443-TSH) TLB-BNP (B-Natriuretic Peptide) (83880-BNPR) TLB-Udip w/ Micro (81001-URINE)  Problem # 3:  EDEMA- LOCALIZED (ICD-782.3) Assessment: New will check for  renal failure, hypot, anemia, dvt, chf Her updated medication list for this problem includes:    Microzide 12.5 Mg Caps (Hydrochlorothiazide) ..... One by mouth once daily for swelling and high blood pressure  Problem # 4:  GERD (ICD-530.81) Assessment: New  Her updated medication list for this problem includes:    Dexilant 60 Mg Cpdr (Dexlansoprazole) ..... One by mouth once daily as needed for heartburn  Orders: Venipuncture (57846) T-D-Dimer Fibrin Derivatives Quantitive ([id number]) TLB-BMP (Basic Metabolic Panel-BMET) (80048-METABOL) TLB-CBC Platelet - w/Differential (85025-CBCD) TLB-Hepatic/Liver Function Pnl (80076-HEPATIC) TLB-TSH (Thyroid Stimulating Hormone) (84443-TSH) TLB-BNP (B-Natriuretic Peptide) (83880-BNPR) TLB-Udip w/ Micro (81001-URINE)  Complete Medication List: 1)  Lorazepam 2 Mg Tabs (Lorazepam) .... Take 1 tab by mouth two times a day as needed for anxiety 2)  Ambien 10 Mg Tabs (Zolpidem tartrate) .... At bedtime 3)  Macrodantin 100 Mg Caps (Nitrofurantoin macrocrystal) .Marland KitchenMarland KitchenMarland Kitchen  As needed 4)  Oxybutynin Chloride 10 Mg Xr24h-tab (Oxybutynin chloride) 5)  Hydrocodone-acetaminophen 5-500 Mg Tabs (Hydrocodone-acetaminophen) .... One by mouth three times a day as needed hip pain 6)  Benefiber  7)  Promethazine Hcl 12.5 Mg Tabs (Promethazine hcl) .Marland Kitchen.. 1 every 6 hours as needed 8)  Trazodone Hcl 50 Mg Tabs (Trazodone hcl) .Marland Kitchen.. 1-2 by mouth at bedtime for insomnia 9)  Mirtazapine 15 Mg Tabs (Mirtazapine) .... One by mouth at bedtime for depression 10)  Phendimetrazine Tartrate 105 Mg Xr24h-cap (Phendimetrazine tartrate) .... One by mouth once daily as needed 11)  Colestipol Hcl 1 Gm Tabs (Colestipol hcl) .... 2 by mouth two times a day 12)  Dexilant 60 Mg Cpdr (Dexlansoprazole) .... One by mouth once daily as needed for heartburn 13)  Microzide 12.5 Mg Caps (Hydrochlorothiazide) .... One by mouth once daily for swelling and high blood pressure  Patient Instructions: 1)   Please schedule a follow-up appointment in 2 weeks. 2)  Limit your Sodium (Salt) to less than 4 grams a day (slightly less than 1 teaspoon) to prevent fluid retention, swelling, or worsening or symptoms. 3)  Avoid foods high in acid (tomatoes, citrus juices, spicy foods). Avoid eating within two hours of lying down or before exercising. Do not over eat; try smaller more frequent meals. Elevate head of bed twelve inches when sleeping. 4)  It is important that you exercise regularly at least 20 minutes 5 times a week. If you develop chest pain, have severe difficulty breathing, or feel very tired , stop exercising immediately and seek medical attention. 5)  You need to lose weight. Consider a lower calorie diet and regular exercise.  6)  Check your Blood Pressure regularly. If it is above 130/80: you should make an appointment. Prescriptions: MICROZIDE 12.5 MG CAPS (HYDROCHLOROTHIAZIDE) One by mouth once daily for swelling and high blood pressure  #30 x 11   Entered and Authorized by:   [name] MD   Signed by:   [name] MD on 11/01/2010   Method used:   Electronically to        [facility] [phone]* (retail)       [address] [place], [place]  [address]       Ph: [phone]       Fax: [phone]   RxID:   [id number] DEXILANT 60 MG CPDR (DEXLANSOPRAZOLE) One by mouth once daily as needed for heartburn  #50 x 0   Entered and Authorized by:   [name] MD   Signed by:   [name] MD on 11/01/2010   Method used:   Samples Given   RxID:   [id number]    Orders Added: 1)  Venipuncture [36415] 2)  T-D-Dimer Fibrin Derivatives Quantitive [[id number]] 3)  TLB-BMP (Basic Metabolic Panel-BMET) [80048-METABOL] 4)  TLB-CBC Platelet - w/Differential [85025-CBCD] 5)  TLB-Hepatic/Liver Function Pnl [80076-HEPATIC] 6)  TLB-TSH (Thyroid Stimulating Hormone) [84443-TSH] 7)  TLB-BNP (B-Natriuretic Peptide) [83880-BNPR] 8)  TLB-Udip w/ Micro  [81001-URINE] 9)  Est. Patient Level IV [73710]    Prevention & Chronic Care Immunizations   Influenza vaccine: Not documented   Influenza vaccine deferral: Refused  (11/01/2010)    Tetanus booster: 01/26/2002: given    Pneumococcal vaccine: Not documented   Pneumococcal vaccine deferral: Refused  (11/01/2010)    H. zoster vaccine: 08/24/2010: Zostavax  Colorectal Screening   Hemoccult: Not documented    Colonoscopy: Normal  (11/09/2005)  Colonoscopy action/deferral: scheduled with [facility]  (08/24/2010)  Other Screening   Pap smear: Not documented   Pap smear action/deferral: patient refuses understanding risks of delayed diagnosis  (08/24/2010)    Mammogram: ASSESSMENT: Negative - BI-RADS 1^MM DIGITAL SCREENING  (01/19/2010)   Mammogram action/deferral: mammogram ordered  (08/24/2010)    DXA bone density scan: Not documented   Smoking status: never  (11/01/2010)  Lipids   Total Cholesterol: 186  (06/02/2009)   LDL: 122  (06/02/2009)   LDL Direct: Not documented   HDL: 30.90  (06/02/2009)   Triglycerides: 167.0  (06/02/2009)    SGOT (AST): 21  (06/02/2009)   SGPT (ALT): 19  (06/02/2009)   Alkaline phosphatase: 50  (06/02/2009)   Total bilirubin: 1.1  (06/02/2009)  Hypertension   Last Blood Pressure: 140 / 80  (11/01/2010)   Serum creatinine: 1.0  (06/02/2009)   Serum potassium 4.4  (06/02/2009)  Self-Management Support :    Hypertension self-management support: Not documented    Lipid self-management support: Not documented

## 2011-01-25 NOTE — Procedures (Signed)
Summary: Gastroenterology [facility]ON  Gastroenterology [facility]   Imported By: Lowry Ram CMA 04/22/2008 15:24:53  _____________________________________________________________________  External Attachment:    Type:   Image     Comment:   External Document

## 2011-01-25 NOTE — Progress Notes (Signed)
  Phone Note Call from Patient Call back at Home Phone [phone]   Caller: Patient Call For: [name] MD Summary of Call: per Sherri Weber states the Trazodone Hcl 50 Mg Tabs (Trazodone hcl) .Marland Kitchen.. 1-2 by mouth at bedtime for insomnia is not helping her to sleep Sherri Weber wants something better another sleep [phone] [phone]* (retail) [address] [place], [place]  [address] Ph: [phone] Fax: [phone] Initial call taken by: Shelbie Proctor,  April 03, 2009 10:47 AM

## 2011-01-25 NOTE — Progress Notes (Signed)
Summary: TRIAGE-rectal pain  Medications Added ANUCORT-HC 25 MG SUPP (HYDROCORTISONE ACETATE) Put one in rectum every night at bedtime for 7-10 nights.       Phone Note Call from Patient Call back at Home Phone [phone]   Caller: Patient Call For: BRODIE Reason for Call: Talk to Nurse Details for Reason: rx Summary of Call: pt's bottom just hurts wondering if Dr can call in a supposotories that were once prescb to her [facility]  [address]  [phone]*)  Follow-up for Phone Call        Pt. c/o episodes of rectal pain. Usually happens after a BM, but not always. No constipation, no diarrhea, occ. blood on tissue after BM. 1) Anucort-HC supp.  1 PR at bedtime X 7-10 days 2) Keep appointment w/Dr.Brodie on 10-07-08 at 3:30pm 3) If symptoms become worse call back immediately.   Follow-up by: Laureen Ochs LPN,  September 16, 2008 1:30 PM  Additional Follow-up for Phone Call Additional follow up Details #1::        reviewed and agree Additional Follow-up by: Hart Carwin MD,  September 16, 2008 5:48 PM    New/Updated Medications: ANUCORT-HC 25 MG SUPP (HYDROCORTISONE ACETATE) Put one in rectum every night at bedtime for 7-10 nights.   Prescriptions: ANUCORT-HC 25 MG SUPP (HYDROCORTISONE ACETATE) Put one in rectum every night at bedtime for 7-10 nights.  #10 x 1   Entered by:   Laureen Ochs LPN   Authorized by:   Hart Carwin MD   Signed by:   Laureen Ochs LPN on 38/75/6433   Method used:   Electronically to        [facility]  [phone]* (retail)       [address]       [place], [place]  [address]       Ph: [phone] or [phone]       Fax: [phone]   RxID:   [id number]

## 2011-01-25 NOTE — Progress Notes (Signed)
  Phone Note Call from Patient Call back at Home Phone [phone]   Caller: Patient Call For: [name] MD Summary of Call: Pt requesting generic muscle relaxer,pt cannot afford $158 for the one that was sent to pharmacy as she is on a fixed income. Can a cheaper, generic by sent, pt's request. [facility] is [facility]. Initial call taken by: Verdell Face,  February 19, 2010 10:14 AM  Follow-up for Phone Call        done Follow-up by: [name] MD,  February 19, 2010 10:16 AM    New/Updated Medications: ROBAXIN 500 MG TABS (METHOCARBAMOL) One by mouth three times a day as needed for muscle spasm Prescriptions: ROBAXIN 500 MG TABS (METHOCARBAMOL) One by mouth three times a day as needed for muscle spasm  #35 x 1   Entered and Authorized by:   [name] MD   Signed by:   [name] MD on 02/19/2010   Method used:   Electronically to        [facility] [phone]* (retail)       [address] [place], [place]  [address]       Ph: [phone]       Fax: [phone]   RxID:   [id number]

## 2011-01-25 NOTE — Assessment & Plan Note (Signed)
Summary: f/u appt/#/cd   Vital Signs:  Patient profile:   77 year old female Menstrual status:  regular Height:      65.5 inches Weight:      133 pounds BMI:     21.87 O2 Sat:      97 % on Room air Temp:     98.7 degrees F oral Pulse rate:   80 / minute Pulse rhythm:   regular Resp:     16 per minute BP sitting:   138 / 90  (left arm) Cuff size:   regular  Vitals Entered By: Rock Nephew CMA (August 24, 2010 9:51 AM)  O2 Flow:  Room air CC: follow-up visit, Preventive Care Is Patient Diabetic? No Pain Assessment Patient in pain? no       Does patient need assistance? Functional Status Self care Ambulation Normal  Vision Screening:Left eye with correction: 20 / 20 Right eye with correction: 20 / 20 Both eyes with correction: 20 / 25  Color vision testing: normal       Primary Care Provider:  Sanda Linger, MD  CC:  follow-up visit and Preventive Care.  History of Present Illness: Here for Medicare AWV:  1.   Risk factors based on Past M, S, F history: see notes 2.   Physical Activities: excellent, lives independently and stays very busy visiting friends and family and travels to [place] to see her daughter 67.   Depression/mood: mood is very good 4.   Hearing: she hears a whispered voice at 3 feet 5.   ADL's: complete and independent, she is well fed and well groomed 6.   Fall Risk: none noted, house is safe with no rugs, she does TUG test in 8 seconds 7.   Home Safety: very good, no hazards 8.   Height, weight, &visual acuity:see notes 9.   Counseling: done 10.   Labs ordered based on risk factors: none needed today 11.           Referral Coordination; just saw Ortho, no new referrals today 12.           Care Plan: see CPOE 13.            Cognitive Assessment; very sharp today, she asks very enlightened questions and she answers all questions appropriately  Preventive Screening-Counseling & Management  Alcohol-Tobacco     Alcohol drinks/day: 0  Smoking Status: never     Passive Smoke Exposure: no     Tobacco Counseling: not indicated; no tobacco use  Caffeine-Diet-Exercise     Does Patient Exercise: yes     Type of exercise: walks     Exercise (avg: min/session): 30-60     Times/week: 4     Exercise Counseling: to improve exercise regimen     PHQ-9 Score: 1-4 minimal depression  Hep-HIV-STD-Contraception     Hepatitis Risk: no risk noted     HIV Risk: no     STD Risk: no risk noted     Dental Visit-last 6 months yes     Dental Care Counseling: to seek dental care; no dental care within six months     SBE monthly: yes     SBE Education/Counseling: to perform regular SBE     Sun Exposure-Excessive: no  Safety-Violence-Falls     Seat Belt Use: yes     Helmet Use: no     Firearms in the Home: no firearms in the home     Smoke Detectors: yes  Violence in the Home: no risk noted     Fall Risk: no      Sexual History:  not active.        Drug Use:  never.        Blood Transfusions:  no.    Clinical Review Panels:  Prevention   Last Mammogram:  ASSESSMENT: Negative - BI-RADS 1^MM DIGITAL SCREENING (01/19/2010)   Last Colonoscopy:  Normal (11/09/2005)  Immunizations   Last Tetanus Booster:  given (01/26/2002)  Lipid Management   Cholesterol:  186 (06/02/2009)   LDL (bad choesterol):  122 (06/02/2009)   HDL (good cholesterol):  30.90 (06/02/2009)  Diabetes Management   Creatinine:  1.0 (06/02/2009)  CBC   WBC:  5.8 (06/02/2009)   RBC:  4.04 (06/02/2009)   Hgb:  13.2 (06/02/2009)   Hct:  36.6 (06/02/2009)   Platelets:  278.0 (06/02/2009)   MCV  90.7 (06/02/2009)   MCHC  35.9 (06/02/2009)   RDW  11.9 (06/02/2009)   PMN:  72.8 (06/02/2009)   Lymphs:  16.1 (06/02/2009)   Monos:  5.9 (06/02/2009)   Eosinophils:  4.2 (06/02/2009)   Basophil:  1.0 (06/02/2009)  Complete Metabolic Panel   Glucose:  86 (06/02/2009)   Sodium:  143 (06/02/2009)   Potassium:  4.4 (06/02/2009)   Chloride:  111  (06/02/2009)   CO2:  29 (06/02/2009)   BUN:  18 (06/02/2009)   Creatinine:  1.0 (06/02/2009)   Albumin:  4.0 (06/02/2009)   Total Protein:  6.9 (06/02/2009)   Calcium:  9.2 (06/02/2009)   Total Bili:  1.1 (06/02/2009)   Alk Phos:  50 (06/02/2009)   SGPT (ALT):  19 (06/02/2009)   SGOT (AST):  21 (06/02/2009)   Medications Prior to Update: 1)  Lorazepam 2 Mg Tabs (Lorazepam) .... Take 1 Tab By Mouth Two Times A Day As Needed For Anxiety 2)  Ambien 10 Mg Tabs (Zolpidem Tartrate) .... At Bedtime 3)  Macrodantin 100 Mg Caps (Nitrofurantoin Macrocrystal) .... As Needed 4)  Oxybutynin Chloride 10 Mg Xr24h-Tab (Oxybutynin Chloride) 5)  Hydrocodone-Acetaminophen 5-500 Mg Tabs (Hydrocodone-Acetaminophen) .... One By Mouth Three Times A Day As Needed Hip Pain 6)  Benefiber 7)  Promethazine Hcl 12.5 Mg Tabs (Promethazine Hcl) .Marland Kitchen.. 1 Every 6 Hours As Needed 8)  Trazodone Hcl 50 Mg Tabs (Trazodone Hcl) .Marland Kitchen.. 1-2 By Mouth At Bedtime For Insomnia 9)  Mirtazapine 15 Mg Tabs (Mirtazapine) .... One By Mouth At Bedtime For Depression 10)  Phendimetrazine Tartrate 105 Mg Xr24h-Cap (Phendimetrazine Tartrate) .... One By Mouth Once Daily As Needed 11)  Oxycodone Hcl 5 Mg Tabs (Oxycodone Hcl) .Marland Kitchen.. 1-2 By Mouth Q 6 Hrs Pain 12)  Prednisone 10 Mg Tabs (Prednisone) .... 4po Qd For 3days, Then 3po Qd For 3days, Then 2po Qd For 3days, Then 1po Qd For 3 Days, Then Stop  Current Medications (verified): 1)  Lorazepam 2 Mg Tabs (Lorazepam) .... Take 1 Tab By Mouth Two Times A Day As Needed For Anxiety 2)  Ambien 10 Mg Tabs (Zolpidem Tartrate) .... At Bedtime 3)  Macrodantin 100 Mg Caps (Nitrofurantoin Macrocrystal) .... As Needed 4)  Oxybutynin Chloride 10 Mg Xr24h-Tab (Oxybutynin Chloride) 5)  Hydrocodone-Acetaminophen 5-500 Mg Tabs (Hydrocodone-Acetaminophen) .... One By Mouth Three Times A Day As Needed Hip Pain 6)  Benefiber 7)  Promethazine Hcl 12.5 Mg Tabs (Promethazine Hcl) .Marland Kitchen.. 1 Every 6 Hours As Needed 8)   Trazodone Hcl 50 Mg Tabs (Trazodone Hcl) .Marland Kitchen.. 1-2 By Mouth At Bedtime For Insomnia 9)  Mirtazapine 15  Mg Tabs (Mirtazapine) .... One By Mouth At Bedtime For Depression 10)  Phendimetrazine Tartrate 105 Mg Xr24h-Cap (Phendimetrazine Tartrate) .... One By Mouth Once Daily As Needed 11)  Polyethylene Glycol 3350  Powd (Polyethylene Glycol 3350) .... Use As Directed  Allergies (verified): No Known Drug Allergies  Past History:  Past Medical History: Reviewed history from 06/02/2009 and no changes required. DEPRESSIVE DISORDER (ICD-311) DIARRHEA (ICD-787.91) FATIGUE (ICD-780.79) DIARRHEA (ICD-787.91) ABDOMINAL DISTENSION (ICD-787.3) HEMORRHOIDS, INTERNAL (ICD-455.0) CONSTIPATION, CHRONIC (ICD-564.09) CARCINOMA, COLON, FAMILY HX (ICD-V16.0) PROLAPSE, RECTAL (ICD-569.1) ULCER, RECTUM (ICD-569.41) IBS (ICD-564.1) Osteoarthritis Low back pain  Social History: Does Patient Exercise:  yes Seat Belt Use:  yes Dental Care w/in 6 mos.:  yes Sun Exposure-Excessive:  no Fall Risk:  no Sexual History:  not active  Review of Systems  The patient denies anorexia, fever, weight loss, weight gain, vision loss, hoarseness, chest pain, syncope, dyspnea on exertion, peripheral edema, prolonged cough, headaches, hemoptysis, abdominal pain, melena, hematochezia, severe indigestion/heartburn, hematuria, suspicious skin lesions, difficulty walking, depression, unusual weight change, abnormal bleeding, angioedema, and breast masses.   GI:  Complains of constipation; denies abdominal pain, bloody stools, change in bowel habits, diarrhea, gas, hemorrhoids, indigestion, loss of appetite, nausea, vomiting, vomiting blood, and yellowish skin color. GU:  Denies dysuria, hematuria, nocturia, urinary frequency, and urinary hesitancy. MS:  Complains of joint pain and stiffness; denies joint redness, joint swelling, loss of strength, low back pain, mid back pain, muscle aches, muscle, cramps, muscle weakness, and  thoracic pain.  Physical Exam  General:  alert, well-developed, well-nourished, well-hydrated, appropriate dress, normal appearance, healthy-appearing, cooperative to examination, and good hygiene.   Head:  normocephalic, atraumatic, no abnormalities observed, and no abnormalities palpated.   Eyes:  vision grossly intact, pupils equal, pupils round, and pupils reactive to light.   Ears:  R ear normal and L ear normal.   Nose:  External nasal examination shows no deformity or inflammation. Nasal mucosa are pink and moist without lesions or exudates. Mouth:  Oral mucosa and oropharynx without lesions or exudates.  Teeth in good repair. Neck:  supple, full ROM, no masses, no thyromegaly, no thyroid nodules or tenderness, no JVD, normal carotid upstroke, no carotid bruits, no cervical lymphadenopathy, and no neck tenderness.   Breasts:  No mass, nodules, thickening, tenderness, bulging, retraction, inflamation, nipple discharge or skin changes noted.   Lungs:  normal respiratory effort, no intercostal retractions, no accessory muscle use, normal breath sounds, no dullness, no fremitus, no crackles, and no wheezes.   Heart:  normal rate, regular rhythm, no murmur, no gallop, no rub, and no JVD.   Abdomen:  soft, non-tender, normal bowel sounds, no distention, no masses, no guarding, no rigidity, no rebound tenderness, no abdominal hernia, no inguinal hernia, no hepatomegaly, and no splenomegaly.   Msk:  normal ROM, no joint tenderness, no joint swelling, no joint warmth, no redness over joints, no joint deformities, no joint instability, no crepitation, and no muscle atrophy.   Pulses:  R and L carotid,radial,femoral,dorsalis pedis and posterior tibial pulses are full and equal bilaterally Extremities:  No clubbing, cyanosis, edema, or deformity noted with normal full range of motion of all joints.   Neurologic:  No cranial nerve deficits noted. Station and gait are normal. Plantar reflexes are  down-going bilaterally. DTRs are symmetrical throughout. Sensory, motor and coordinative functions appear intact. Skin:  turgor normal, color normal, no rashes, no suspicious lesions, no ecchymoses, no petechiae, no purpura, no ulcerations, no edema, and solar damage.  Cervical Nodes:  no anterior cervical adenopathy and no posterior cervical adenopathy.   Axillary Nodes:  no R axillary adenopathy and no L axillary adenopathy.   Inguinal Nodes:  no R inguinal adenopathy and no L inguinal adenopathy.   Psych:  Oriented X3, memory intact for recent and remote, normally interactive, good eye contact, not anxious appearing, not depressed appearing, not agitated, and not suicidal.     Impression & Recommendations:  Problem # 1:  ROUTINE GENERAL MEDICAL EXAM@HEALTH  CARE FACL (ICD-V70.0) Assessment Unchanged  Mammogram: ASSESSMENT: Negative - BI-RADS 1^MM DIGITAL SCREENING (01/19/2010) Td Booster: given (01/26/2002)   Chol: 186 (06/02/2009)   HDL: 30.90 (06/02/2009)   LDL: 122 (06/02/2009)   TG: 167.0 (06/02/2009) TSH: 4.01 (06/02/2009)    Discussed using sunscreen, use of alcohol, drug use, self breast exam, routine dental care, routine eye care, schedule for GYN exam, routine physical exam, seat belts, multiple vitamins, osteoporosis prevention, adequate calcium intake in diet, recommendations for immunizations, mammograms and Pap smears.  Discussed exercise and checking cholesterol.   Orders: [facility] -Subsequent Annual Wellness Visit [phone])  Problem # 2:  OSTEOARTHRITIS (ICD-715.90) Assessment: Unchanged  The following medications were removed from the medication list:    Oxycodone Hcl 5 Mg Tabs (Oxycodone hcl) .Marland Kitchen... 1-2 by mouth q 6 hrs pain Her updated medication list for this problem includes:    Hydrocodone-acetaminophen 5-500 Mg Tabs (Hydrocodone-acetaminophen) ..... One by mouth three times a day as needed hip pain  Problem # 3:  CONSTIPATION, CHRONIC (ICD-564.09) Assessment:  Unchanged  Her updated medication list for this problem includes:    Polyethylene Glycol 3350 Powd (Polyethylene glycol 3350) ..... Use as directed  Discussed dietary fiber measures and increased water intake.   Problem # 4:  MAJOR DPRSV DISORDER RECURRENT EPISODE MODERATE (ICD-296.32) Assessment: Improved continue mirtazapine  Complete Medication List: 1)  Lorazepam 2 Mg Tabs (Lorazepam) .... Take 1 tab by mouth two times a day as needed for anxiety 2)  Ambien 10 Mg Tabs (Zolpidem tartrate) .... At bedtime 3)  Macrodantin 100 Mg Caps (Nitrofurantoin macrocrystal) .... As needed 4)  Oxybutynin Chloride 10 Mg Xr24h-tab (Oxybutynin chloride) 5)  Hydrocodone-acetaminophen 5-500 Mg Tabs (Hydrocodone-acetaminophen) .... One by mouth three times a day as needed hip pain 6)  Benefiber  7)  Promethazine Hcl 12.5 Mg Tabs (Promethazine hcl) .Marland Kitchen.. 1 every 6 hours as needed 8)  Trazodone Hcl 50 Mg Tabs (Trazodone hcl) .Marland Kitchen.. 1-2 by mouth at bedtime for insomnia 9)  Mirtazapine 15 Mg Tabs (Mirtazapine) .... One by mouth at bedtime for depression 10)  Phendimetrazine Tartrate 105 Mg Xr24h-cap (Phendimetrazine tartrate) .... One by mouth once daily as needed 11)  Polyethylene Glycol 3350 Powd (Polyethylene glycol 3350) .... Use as directed  Other Orders: TLB-Lipid Panel (80061-LIPID)  Colorectal Screening:  Current Recommendations:    Colonoscopy recommended: scheduled with G.I.  Colonoscopy Results:    Date of Exam: 11/09/2005    Results: Normal  PAP Screening:    Hx Cervical Dysplasia in last 5 yrs? No    3 normal PAP smears in last 5 yrs? No    Reviewed PAP smear recommendations:  patient refuses understanding risks of delayed diagnosis  Mammogram Screening:    Last Mammogram:  01/19/2010    Reviewed Mammogram recommendations:  mammogram ordered  Osteoporosis Risk Assessment:  Risk Factors for Fracture or Low Bone Density:   Race (White or Asian):     yes   Hx of Fractures:        no  FH of Osteoporosis:     no   Hx of falls:       no   Physically inactive:     no   Smoking status:       never   High alcohol use:     no   High caffeine use:     no   Low calcium/Vit. D intake:   yes   Corticosteroid use:     no   Thyroid replacement:     no   Dilantin use:       no   Heparin use:       no   Thyroid disease:     no   Rheumatoid Arthritis:     no   Parathyroid disease:     no  Immunization & Chemoprophylaxis:    Tetanus vaccine: given  (01/26/2002)  Patient Instructions: 1)  Please schedule a follow-up appointment in 3 months. 2)  It is important that you exercise regularly at least 20 minutes 5 times a week. If you develop chest pain, have severe difficulty breathing, or feel very tired , stop exercising immediately and seek medical attention. 3)  You need to lose weight. Consider a lower calorie diet and regular exercise.  4)  Check your Blood Pressure regularly. If it is above 140/90: you should make an appointment. Prescriptions: PROMETHAZINE HCL 12.5 MG TABS (PROMETHAZINE HCL) 1 every 6 hours as needed  #25 x 3   Entered and Authorized by:   [name] MD   Signed by:   [name] MD on 08/24/2010   Method used:   Print then Give to Patient   RxID:   [id number] HYDROCODONE-ACETAMINOPHEN 5-500 MG TABS (HYDROCODONE-ACETAMINOPHEN) One by mouth three times a day as needed hip pain  #75 x 3   Entered and Authorized by:   [name] MD   Signed by:   [name] MD on 08/24/2010   Method used:   Print then Give to Patient   RxID:   [id number] POLYETHYLENE GLYCOL 3350  POWD (POLYETHYLENE GLYCOL 3350) use as directed  #1 container x 11   Entered and Authorized by:   [name] MD   Signed by:   [name] MD on 08/24/2010   Method used:   Electronically to        [facility] [phone]* (retail)       [address] [place], [place]  [address]       Ph: [phone]       Fax: [phone]   RxID:    [id number]      Appended Document: f/u appt/#/cd     Clinical Lists Changes  Orders: Added new Service order of Zoster (Shingles) Vaccine Live [phone]) - Signed Added new Service order of Admin 1st Vaccine ([id number]) - Signed Observations: Added new observation of ZOSTAVAX VIS: 10/07/05 given August 24, 2010. (08/24/2010 10:32) Added new observation of ZOSTAVAX LOT: [id number] (08/24/2010 10:32) Added new observation of ZOSTAVAX EXP: 06/04/2011 (08/24/2010 10:32) Added new observation of ZOSTAVAX BY: Alvy Beal Archie CMA (08/24/2010 10:32) Added new observation of ZOSTAVAX RTE: [place] (08/24/2010 10:32) Added new observation of ZOSTAVAXDOSE: 0.65  (08/24/2010 10:32) Added new observation of ZOSTAVAX MFR: Merck  (08/24/2010 10:32) Added new observation of ZOSTAVAXSITE: left deltoid  (08/24/2010 10:32) Added new observation of ZOSTAVAX: Zostavax  (08/24/2010 10:32)       Immunizations Administered:  Zostavax # 1:  Vaccine Type: Zostavax    Site: left deltoid    Mfr: Merck    Dose: 0.65    Route: [place]    Given by: Rock Nephew CMA    Exp. Date: 06/04/2011    Lot #: [id number]    VIS given: 10/07/05 given August 24, 2010.

## 2011-01-25 NOTE — Assessment & Plan Note (Signed)
Summary: 1 mos f/u $50 / cd   Vital Signs:  Patient Profile:   77 Years Old Female Height:     66 inches Weight:      140.0 pounds O2 Sat:      97 % O2 treatment:    Room Air Temp:     97.1 degrees F oral Pulse rate:   87 / minute BP sitting:   112 / 84  (left arm) Cuff size:   regular  Pt. in pain?   no  Vitals Entered By: Orlan Leavens (January 28, 2009 10:45 AM)                  PCP:  [name] MD  Chief Complaint:  1 MONTH FOLLOW-UP/ PT STATES SHE LOST RX FOR LORAZEPAM REQ NEW PRESCRIPTION.  History of Present Illness: pt says she lost her lost her rx for ativan.  she was rx'ed # 30 x4, on 01/01/09.  she also received a refill from dr Juanda Chance on 01/01/09.  she also wants to double the dosage.  main symptom is insomnia.  she was turned down at [facility] for an early refill of ambien. pt stopped her zoloft--says she doesn't need it.  says it did not help her ("i couldn't cry when i was on it").  pt says she is using her vicodin as a sleeping agent.     Prior Medications Reviewed Using: Patient Recall  Prior Medication List:  LORAZEPAM 2 MG TABS (LORAZEPAM) Take 1 tab by mouth at bedtime AMBIEN 10 MG TABS (ZOLPIDEM TARTRATE) at bedtime MACRODANTIN 100 MG CAPS (NITROFURANTOIN MACROCRYSTAL) as needed COLESTID 1 GM TABS (COLESTIPOL HCL) Take 2 tablets by mouth two times a day GABAPENTIN 100 MG CAPS (GABAPENTIN) Take 1 tablet by mouth three times a day OXYBUTYNIN CHLORIDE 10 MG XR24H-TAB (OXYBUTYNIN CHLORIDE)  HYDROCODONE-ACETAMINOPHEN 5-500 MG TABS (HYDROCODONE-ACETAMINOPHEN) as needed   Updated Prior Medication List: LORAZEPAM 2 MG TABS (LORAZEPAM) Take 1 tab by mouth at bedtime AMBIEN 10 MG TABS (ZOLPIDEM TARTRATE) at bedtime MACRODANTIN 100 MG CAPS (NITROFURANTOIN MACROCRYSTAL) as needed COLESTID 1 GM TABS (COLESTIPOL HCL) Take 2 tablets by mouth two times a day GABAPENTIN 100 MG CAPS (GABAPENTIN) Take 1 tablet by mouth three times a day OXYBUTYNIN CHLORIDE 10 MG  XR24H-TAB (OXYBUTYNIN CHLORIDE)  HYDROCODONE-ACETAMINOPHEN 5-500 MG TABS (HYDROCODONE-ACETAMINOPHEN) as needed  Current Allergies: No known allergies   Past Medical History:    Reviewed history from 04/22/2008 and no changes required:       DEPRESSIVE DISORDER (ICD-311)       DIARRHEA (ICD-787.91)       FATIGUE (ICD-780.79)       DIARRHEA (ICD-787.91)       ABDOMINAL DISTENSION (ICD-787.3)       HEMORRHOIDS, INTERNAL (ICD-455.0)       CONSTIPATION, CHRONIC (ICD-564.09)       CARCINOMA, COLON, FAMILY HX (ICD-V16.0)       PROLAPSE, RECTAL (ICD-569.1)       ULCER, RECTUM (ICD-569.41)       IBS (ICD-564.1)     Review of Systems       The patient complains of depression.     Physical Exam  General:     well developed, well nourished, in no acute distress Neck:     no thyromegaly Psych:     depressed affect.   Additional Exam:     FastTSH  3.96 uIU/mL      Impression & Recommendations:  Problem # 1:  INSOMNIA (ICD-780.52) Assessment: Unchanged  Other Orders: TLB-TSH (Thyroid Stimulating Hormone) (84443-TSH)   Patient Instructions: 1)  i told pt i cannot replace the rx for ativan she lost 2)  same other meds 3)  ret prn

## 2011-01-25 NOTE — Letter (Signed)
Summary: Results Letter  [facility]  [address] [place], [place] [address]   Phone: [phone]  Fax: [phone]        August 22, 2008 Sherri Weber    EMORIE MCFATE [address] [place], [place]  [address]    Dear [name],   I am sorry I did not call You but the telephone number we have on You is wrong ( [phone]). Your x -ray shows lot of gas but no obstruction. Please continue the medication I gave You. Your blood test for wheat allergy was normal.Please call me if You are not better within 3-4 weeks, [phone]  ( Debra, my nurse)         Sincerely,  Hart Carwin MD  This letter has been electronically signed by your physician.  Appended Document: Results Letter Letter mailed to patient.

## 2011-01-25 NOTE — Assessment & Plan Note (Signed)
Summary: shoulder pain/SD   Vital Signs:  Patient profile:   77 year old female Menstrual status:  regular Height:      65.5 inches Weight:      131.25 pounds BMI:     21.59 O2 Sat:      95 % on Room air Temp:     98.2 degrees F oral Pulse rate:   104 / minute BP sitting:   102 / 72  (left arm) Cuff size:   regular  Vitals Entered By: [name] Ewing CMA Duncan Dull) (July 07, 2010 2:31 PM)  O2 Flow:  Room air CC: Right shoulder pain/RE   Primary Care Provider:  Sanda Linger, MD  CC:  Right shoulder pain/RE.  History of Present Illness: here with intermittent right shoulder pain since april 2011;  ? aggrevated by trying to admin an enema for her recent GI procedure (?sigmoidoscopy);  lots of stress recently;  pain overall worse to her in the past wk;  heat pad helped some, but much worse to lie on the right side, cant sleep due to the pain which is also worse to lie the left side and the right arm "pulls" to the bed when lying on the left side; overall mod, but severe flare yesterday, seemed some better this AM but then worse again;  worse also to drive and turn the head to the right blind side;  on further questioning also has significant at the right neck , goes past the shoulder, some radaiton to the right shoulder blade and all past the elbow to the hand; no numbness, and no weakness.  Had similar episode years ago when working for [employer], saw family MD - tx with cortisone shot and seemed better.  No bowel or bladder symptoms, no gait change , or falls or injury.  No fever, night sweats, wt loss (actually wt today is highest in 6 mo but less active due to heat lately). Tried left over robaxin generic without help yesterday. Hydrocodone not working for pain  Problems Prior to Update: 1)  Neck Pain, Right  (ICD-723.1) 2)  Shoulder Pain, Right  (ICD-719.41) 3)  Uti  (ICD-599.0) 4)  Obesity  (ICD-278.00) 5)  Routine General Medical Exam@[facility]  (ICD-V70.0) 6)  Hyperlipidemia   (ICD-272.4) 7)  Low Back Pain  (ICD-724.2) 8)  Osteoarthritis  (ICD-715.90) 9)  Insomnia  (ICD-780.52) 10)  Major Dprsv Disorder Recurrent Episode Moderate  (ICD-296.32) 11)  Fatigue  (ICD-780.79) 12)  Constipation, Chronic  (ICD-564.09) 13)  Carcinoma, Colon, Family Hx  (ICD-V16.0) 14)  Prolapse, Rectal  (ICD-569.1) 15)  Ulcer, Rectum  (ICD-569.41) 16)  Ibs  (ICD-564.1)  Medications Prior to Update: 1)  Lorazepam 2 Mg Tabs (Lorazepam) .... Take 1 Tab By Mouth Two Times A Day As Needed For Anxiety 2)  Ambien 10 Mg Tabs (Zolpidem Tartrate) .... At Bedtime 3)  Macrodantin 100 Mg Caps (Nitrofurantoin Macrocrystal) .... As Needed 4)  Oxybutynin Chloride 10 Mg Xr24h-Tab (Oxybutynin Chloride) 5)  Hydrocodone-Acetaminophen 5-500 Mg Tabs (Hydrocodone-Acetaminophen) .... One By Mouth Three Times A Day As Needed Hip Pain 6)  Benefiber 7)  Promethazine Hcl 12.5 Mg Tabs (Promethazine Hcl) .Marland Kitchen.. 1 Every 6 Hours As Needed 8)  Trazodone Hcl 50 Mg Tabs (Trazodone Hcl) .Marland Kitchen.. 1-2 By Mouth At Bedtime For Insomnia 9)  Mirtazapine 15 Mg Tabs (Mirtazapine) .... One By Mouth At Bedtime For Depression 10)  Phendimetrazine Tartrate 105 Mg Xr24h-Cap (Phendimetrazine Tartrate) .... One By Mouth Once Daily As Needed  Current Medications (  verified): 1)  Lorazepam 2 Mg Tabs (Lorazepam) .... Take 1 Tab By Mouth Two Times A Day As Needed For Anxiety 2)  Ambien 10 Mg Tabs (Zolpidem Tartrate) .... At Bedtime 3)  Macrodantin 100 Mg Caps (Nitrofurantoin Macrocrystal) .... As Needed 4)  Oxybutynin Chloride 10 Mg Xr24h-Tab (Oxybutynin Chloride) 5)  Hydrocodone-Acetaminophen 5-500 Mg Tabs (Hydrocodone-Acetaminophen) .... One By Mouth Three Times A Day As Needed Hip Pain 6)  Benefiber 7)  Promethazine Hcl 12.5 Mg Tabs (Promethazine Hcl) .Marland Kitchen.. 1 Every 6 Hours As Needed 8)  Trazodone Hcl 50 Mg Tabs (Trazodone Hcl) .Marland Kitchen.. 1-2 By Mouth At Bedtime For Insomnia 9)  Mirtazapine 15 Mg Tabs (Mirtazapine) .... One By Mouth At Bedtime For  Depression 10)  Phendimetrazine Tartrate 105 Mg Xr24h-Cap (Phendimetrazine Tartrate) .... One By Mouth Once Daily As Needed 11)  Oxycodone Hcl 5 Mg Tabs (Oxycodone Hcl) .Marland Kitchen.. 1-2 By Mouth Q 6 Hrs Pain 12)  Prednisone 10 Mg Tabs (Prednisone) .... 4po Qd For 3days, Then 3po Qd For 3days, Then 2po Qd For 3days, Then 1po Qd For 3 Days, Then Stop  Allergies (verified): No Known Drug Allergies  Past History:  Past Medical History: Last updated: 06/02/2009 DEPRESSIVE DISORDER (ICD-311) DIARRHEA (ICD-787.91) FATIGUE (ICD-780.79) DIARRHEA (ICD-787.91) ABDOMINAL DISTENSION (ICD-787.3) HEMORRHOIDS, INTERNAL (ICD-455.0) CONSTIPATION, CHRONIC (ICD-564.09) CARCINOMA, COLON, FAMILY HX (ICD-V16.0) PROLAPSE, RECTAL (ICD-569.1) ULCER, RECTUM (ICD-569.41) IBS (ICD-564.1) Osteoarthritis Low back pain  Past Surgical History: Last updated: 09/09/2008 Cholecystectomy Hysterectomy Appendectomy -1998 S/P RIGHT HEMICOLECTOMY,CECECTOMY  Social History: Last updated: 11/06/2008 Occupation: Retired Illicit Drug Use - no Never Smoked Regular exercise-no Alcohol use-no  Risk Factors: Alcohol Use: 0 (06/22/2010) Exercise: no (11/06/2008)  Risk Factors: Smoking Status: never (06/22/2010) Passive Smoke Exposure: no (06/22/2010)  Review of Systems       all otherwise negative per pt -    Physical Exam  General:  alert.   Head:  normocephalic and atraumatic.   Eyes:  vision grossly intact, pupils equal, and pupils round.   Ears:  R ear normal and L ear normal.   Nose:  no external deformity and no nasal discharge.   Mouth:  no gingival abnormalities and pharynx pink and moist.   Neck:  supple and no masses.   Lungs:  normal respiratory effort and normal breath sounds.   Heart:  normal rate and regular rhythm.   Msk:  right shoulder diffuse tender to palpation but now swollen, no erythema and has FROM with mild pain only it seems;  neck with FROM, NT, no rash or swelling Extremities:  no  edema, no erythema  Neurologic:  cranial nerves II-XII intact, strength normal in all extremities, sensation intact to light touch, gait normal, and DTRs symmetrical and normal.     Impression & Recommendations:  Problem # 1:  SHOULDER PAIN, RIGHT (ICD-719.41)  Her updated medication list for this problem includes:    Hydrocodone-acetaminophen 5-500 Mg Tabs (Hydrocodone-acetaminophen) ..... One by mouth three times a day as needed hip pain    Oxycodone Hcl 5 Mg Tabs (Oxycodone hcl) .Marland Kitchen... 1-2 by mouth q 6 hrs pain c/w rotater cuff tendonitis, for pain control adn pred pack, consider ortho eval  Problem # 2:  NECK PAIN, RIGHT (ICD-723.1)  Her updated medication list for this problem includes:    Hydrocodone-acetaminophen 5-500 Mg Tabs (Hydrocodone-acetaminophen) ..... One by mouth three times a day as needed hip pain    Oxycodone Hcl 5 Mg Tabs (Oxycodone hcl) .Marland Kitchen... 1-2 by mouth q 6 hrs pain with probable radicular  pain to the RUE, inaddition to above - treat as above, f/u any worsening signs or symptoms , consider ortho eval  Problem # 3:  LOW BACK PAIN (ICD-724.2)  Her updated medication list for this problem includes:    Hydrocodone-acetaminophen 5-500 Mg Tabs (Hydrocodone-acetaminophen) ..... One by mouth three times a day as needed hip pain    Oxycodone Hcl 5 Mg Tabs (Oxycodone hcl) .Marland Kitchen... 1-2 by mouth q 6 hrs pain mild now, chronic, no recent worsening, Continue all previous medications as before this visit   Complete Medication List: 1)  Lorazepam 2 Mg Tabs (Lorazepam) .... Take 1 tab by mouth two times a day as needed for anxiety 2)  Ambien 10 Mg Tabs (Zolpidem tartrate) .... At bedtime 3)  Macrodantin 100 Mg Caps (Nitrofurantoin macrocrystal) .... As needed 4)  Oxybutynin Chloride 10 Mg Xr24h-tab (Oxybutynin chloride) 5)  Hydrocodone-acetaminophen 5-500 Mg Tabs (Hydrocodone-acetaminophen) .... One by mouth three times a day as needed hip pain 6)  Benefiber  7)  Promethazine Hcl  12.5 Mg Tabs (Promethazine hcl) .Marland Kitchen.. 1 every 6 hours as needed 8)  Trazodone Hcl 50 Mg Tabs (Trazodone hcl) .Marland Kitchen.. 1-2 by mouth at bedtime for insomnia 9)  Mirtazapine 15 Mg Tabs (Mirtazapine) .... One by mouth at bedtime for depression 10)  Phendimetrazine Tartrate 105 Mg Xr24h-cap (Phendimetrazine tartrate) .... One by mouth once daily as needed 11)  Oxycodone Hcl 5 Mg Tabs (Oxycodone hcl) .Marland Kitchen.. 1-2 by mouth q 6 hrs pain 12)  Prednisone 10 Mg Tabs (Prednisone) .... 4po qd for 3days, then 3po qd for 3days, then 2po qd for 3days, then 1po qd for 3 days, then stop  Patient Instructions: 1)  Please take all new medications as prescribed 2)  Continue all previous medications as before this visit  3)  Please schedule an appointment with your primary doctor as needed 4)  Consider calling Mon july 18 for orthopedic referral if not improved Prescriptions: PREDNISONE 10 MG TABS (PREDNISONE) 4po qd for 3days, then 3po qd for 3days, then 2po qd for 3days, then 1po qd for 3 days, then stop  #30 x 0   Entered and Authorized by:   Corwin Levins MD   Signed by:   Corwin Levins MD on 07/07/2010   Method used:   Print then Give to Patient   RxID:   [id number] OXYCODONE HCL 5 MG TABS (OXYCODONE HCL) 1-2 by mouth q 6 hrs pain  #60 x 0   Entered and Authorized by:   Corwin Levins MD   Signed by:   Corwin Levins MD on 07/07/2010   Method used:   Print then Give to Patient   RxID:   [id number]

## 2011-01-25 NOTE — Assessment & Plan Note (Signed)
Summary: f/u ..em    History of Present Illness Visit Type: 77 year old white female with history of irritable bowel syndrome and incontinent diarrhea. She had a repair of rectal prolapse in April 2009. There is a family history of colon cancer in her sister last colonoscopy was in September 2006 with findings of a rectal ulcer Primary GI MD: Lina Sar MD Primary Provider: [facility] Chief Complaint: follow up appt for bowels History of Present Illness:   77 year old white female with irritable bowel syndrome fecal  incontinence and intermittent  diarrhea. History of rectal ulcer  and  rectal prolapse. Status post  posterior colporrhaphy in May 2007 and repair of an  rectocele. She has been satisfied with the results. Last colonoscopy in November 2005 confirmed presence of rectal ulcer. Flexible sigmoidoscopy in September 2006 again showed rather large solitary rectal ulcer. She has been using Anusol-HC suppositories drip patient's diarrhea has markedly improved on lorazepam 2 mg a day Colestid 2 g a day. She also takes a  probiotic.   GI Review of Systems    Reports bloating.      Denies abdominal pain, acid reflux, belching, chest pain, dysphagia with liquids, dysphagia with solids, heartburn, loss of appetite, nausea, vomiting, vomiting blood, weight loss, and  weight gain.      Reports diarrhea, irritable bowel syndrome, and  rectal bleeding.     Denies anal fissure, black tarry stools, change in bowel habit, constipation, diverticulosis, fecal incontinence, heme positive stool, hemorrhoids, jaundice, light color stool, liver problems, and  rectal pain.     Prior Medication List:  LORAZEPAM 1 MG TABS (LORAZEPAM) once daily AMBIEN 10 MG TABS (ZOLPIDEM TARTRATE) at bedtime MACRODANTIN 100 MG CAPS (NITROFURANTOIN MACROCRYSTAL) as needed COLESTID 1 GM TABS (COLESTIPOL HCL) Take 2 tablets by mouth two times a day PHENDIMETRAZINE TARTRATE 105 MG XR24H-CAP (PHENDIMETRAZINE TARTRATE) Take 1  tablet by mouth every morning. ANUCORT-HC 25 MG SUPP (HYDROCORTISONE ACETATE) Put one in rectum every night at bedtime for 7-10 nights.   Current Allergies (reviewed today): No known allergies   Past Medical History:    Reviewed history from 04/22/2008 and no changes required:       Current Problems:        HEMORRHOIDS, INTERNAL (ICD-455.0)       CONSTIPATION, CHRONIC (ICD-564.09)       CARCINOMA, COLON, FAMILY HX (ICD-V16.0)       PROLAPSE, RECTAL (ICD-569.1)       ULCER, RECTUM (ICD-569.41)       IBS (ICD-564.1)         Past Surgical History:    Reviewed history from 09/09/2008 and no changes required:       Cholecystectomy       Hysterectomy       Appendectomy -1998       S/P RIGHT HEMICOLECTOMY,CECECTOMY   Family History:    Reviewed history from 08/20/2008 and no changes required:       Family History of Colon Cancer: Sister       Family History of Diabetes: Sister  Social History:    Reviewed history from 08/20/2008 and no changes required:       Occupation: Retired       Alcohol Use - yes-socially       Illicit Drug Use - no    Review of Systems       The patient complains of depression-new and sleeping problems.     Vital Signs:  Patient Profile:   77 Years Old Female  Height:     67 inches Weight:      99.63 pounds BMI:     15.66 BSA:     1.51 Pulse rate:   80 / minute Pulse rhythm:   regular BP sitting:   96 / 58  (right arm)  Vitals Entered By: Paulene Floor, RN (October 07, 2008 3:29 PM)                  Physical Exam  General:     Well developed, well nourished, no acute distress. Neck:     Supple; no masses or thyromegaly. Lungs:     Clear throughout to auscultation. Heart:     Regular rate and rhythm; no murmurs, rubs,  or bruits. Abdomen:     soft abdomen there is a hyperactive bowel sounds, no tenderness, no distention, Rectal:     decreased rectal tone stool is Hemoccult positive    Impression &  Recommendations:  Problem # 1:  DIARRHEA (ICD-787.91) irritable bowel syndrome with predominant diarrhea,  currently under good control with Colestid and dietary modifications. Her Hemoccult-positive stool is likely related to presence of rectal ulcer. She is to continue on Anusol-HC suppositories  Problem # 2:  ABDOMINAL DISTENSION (ICD-787.3) abdominal distention and gas. Recommend probiotics and over-the-counter gas medications such as Phazyme or Gas-X  Problem # 3:  CARCINOMA, COLON, FAMILY HX (ICD-V16.0) recall colonoscopy in November 2010   Patient Instructions: 1)  Anusol-HC suppositories q.h.s. 2)  Align 1 by mouth once daily 3)  Recall colonoscopy November 2010 4)  Colestid 1 g 2 tablets twice a day 5)  Lorazepam 2 mg dispensed 3o, 1 p.o. q.h.s. with 3 refills    Prescriptions: LORAZEPAM 2 MG TABS (LORAZEPAM) Take 1 tab by mouth at bedtime  #30 x 3   Entered by:   Hortense Ramal CMA   Authorized by:   Hart Carwin MD   Signed by:   Hortense Ramal CMA on 10/07/2008   Method used:   Printed then faxed to ...       [facility]  [address]  [phone]* (retail)       [address]       [place], [place]  [address]       Ph: [phone] or [phone]       Fax: [phone]   RxID:   [id number]  ]

## 2011-01-25 NOTE — Progress Notes (Signed)
  Phone Note Call from Patient Call back at Home Phone [phone]   Caller: Patient Call For: [name] MD Summary of Call: Pt seen yesterday, she went to pick up cholesterol med at pharmacy and it is over $80. She is requesting we change to one she can afford.Please advise. Initial call taken by: Verdell Face,  October 06, 2010 9:28 AM  Follow-up for Phone Call        she was given this for diarrhea and there is no alternative Follow-up by: [name] MD,  October 06, 2010 9:34 AM  Additional Follow-up for Phone Call Additional follow up Details #1::        Pharmacy at [facility] told pt that medicine sent in was for cholesterol. Can triage call pharmacy and clarify wpharmacist that it is diarrhea medicine. Additional Follow-up by: Verdell Face,  October 06, 2010 12:36 PM    Additional Follow-up for Phone Call Additional follow up Details #2::    Left detailed vm for patient that there is no alt and this is treatment for diarrhea. ALso gave info to pharmacy.  Follow-up by: Lamar Sprinkles, CMA,  October 06, 2010 6:05 PM

## 2011-01-25 NOTE — Letter (Signed)
Summary: Arloa Koh [facility]   Imported By: Lester  02/03/2010 10:45:39  _____________________________________________________________________  External Attachment:    Type:   Image     Comment:   External Document

## 2011-01-25 NOTE — Letter (Signed)
Summary: Results Follow-up Letter  [facility]  [address] [place], [place] [address]   Phone: [phone]  Fax: [phone]    06/03/2009  [address] [name], [place]  [address]  Dear Ms. Sherri Weber,   The following are the results of your recent test(s):  Test     Result     Kidney     normal CBC       normal Liver       normal Thyroid     normal Urine       slight trace of infection  _________________________________________________________  Please call for an appointment as directed _________________________________________________________ _________________________________________________________ _________________________________________________________  Sincerely,  Sanda Linger MD [facility]

## 2011-01-25 NOTE — Assessment & Plan Note (Signed)
Summary: 1 mos f/u #/cd   Vital Signs:  Sherri Weber profile:   77 year old female Menstrual status:  postmenopausal Height:      65.5 inches Weight:      134 pounds BMI:     22.04 O2 Sat:      97 % on Room air Temp:     98.0 degrees F oral Pulse rate:   96 / minute Pulse rhythm:   regular Resp:     16 per minute BP sitting:   108 / 64  (left arm) Cuff size:   large  Vitals Entered By: Rock Nephew CMA (November 29, 2010 9:24 AM)  O2 Flow:  Room air CC: Sherri Weber c/o  L big toe swelling Is Sherri Weber Diabetic? No Pain Assessment Sherri Weber in pain? no       Does Sherri Weber need assistance? Functional Status Self care Ambulation Normal   Primary Care Provider:  Sanda Linger, MD  CC:  Sherri Weber c/o  L big toe swelling.  History of Present Illness: She returns complaining of a problem with her left great toe for about one month with pain, redness, and swelling.  Current Medications (verified): 1)  Lorazepam 2 Mg Tabs (Lorazepam) .... Take 1 Tab By Mouth Two Times A Day As Needed For Anxiety 2)  Ambien 10 Mg Tabs (Zolpidem Tartrate) .... At Bedtime 3)  Macrodantin 100 Mg Caps (Nitrofurantoin Macrocrystal) .... As Needed 4)  Oxybutynin Chloride 10 Mg Xr24h-Tab (Oxybutynin Chloride) 5)  Hydrocodone-Acetaminophen 5-500 Mg Tabs (Hydrocodone-Acetaminophen) .... One By Mouth Three Times A Day As Needed Hip Pain 6)  Benefiber 7)  Promethazine Hcl 12.5 Mg Tabs (Promethazine Hcl) .Marland Kitchen.. 1 Every 6 Hours As Needed 8)  Trazodone Hcl 50 Mg Tabs (Trazodone Hcl) .Marland Kitchen.. 1-2 By Mouth At Bedtime For Insomnia 9)  Mirtazapine 15 Mg Tabs (Mirtazapine) .... One By Mouth At Bedtime For Depression 10)  Phendimetrazine Tartrate 105 Mg Xr24h-Cap (Phendimetrazine Tartrate) .... One By Mouth Once Daily As Needed 11)  Colestipol Hcl 1 Gm Tabs (Colestipol Hcl) .... 2 By Mouth Two Times A Day 12)  Dexilant 60 Mg Cpdr (Dexlansoprazole) .... One By Mouth Once Daily As Needed For Heartburn  Allergies (verified): No  Known Drug Allergies  Past History:  Past Medical History: Last updated: 06/02/2009 DEPRESSIVE DISORDER (ICD-311) DIARRHEA (ICD-787.91) FATIGUE (ICD-780.79) DIARRHEA (ICD-787.91) ABDOMINAL DISTENSION (ICD-787.3) HEMORRHOIDS, INTERNAL (ICD-455.0) CONSTIPATION, CHRONIC (ICD-564.09) CARCINOMA, COLON, FAMILY HX (ICD-V16.0) PROLAPSE, RECTAL (ICD-569.1) ULCER, RECTUM (ICD-569.41) IBS (ICD-564.1) Osteoarthritis Low back pain  Past Surgical History: Last updated: 09/09/2008 Cholecystectomy Hysterectomy Appendectomy -1998 S/P RIGHT HEMICOLECTOMY,CECECTOMY  Family History: Last updated: 08/20/2008 Family History of Colon Cancer: Sister Family History of Diabetes: Sister  Social History: Last updated: 11/06/2008 Occupation: Retired Illicit Drug Use - no Never Smoked Regular exercise-no Alcohol use-no  Risk Factors: Alcohol Use: 0 (11/11/2010) Exercise: yes (08/24/2010)  Risk Factors: Smoking Status: never (11/11/2010) Passive Smoke Exposure: no (11/11/2010)  Family History: Reviewed history from 08/20/2008 and no changes required. Family History of Colon Cancer: Sister Family History of Diabetes: Sister  Social History: Reviewed history from 11/06/2008 and no changes required. 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    Physical Exam  General:  alert, well-developed, well-nourished, well-hydrated, appropriate dress, normal appearance, healthy-appearing, and cooperative to examination.  Mouth:  Oral mucosa and oropharynx without lesions or exudates.  Teeth in good repair. Neck:  supple, full ROM, no masses, no thyromegaly, no JVD, normal carotid upstroke, and  no carotid bruits.   Lungs:  normal respiratory effort, no intercostal retractions, no accessory muscle use, normal breath sounds, no dullness, no fremitus, no crackles, and no wheezes.   Heart:  normal rate, regular rhythm, no murmur, no gallop, and no rub.   Abdomen:  soft, non-tender, normal bowel sounds, no distention, no masses, no guarding, no rigidity, no rebound tenderness, no abdominal hernia, no inguinal hernia, no hepatomegaly, and no splenomegaly.   Msk:  left great toe shows that the medial side of the toenail is ingrown with surrounding erythema, ttp, and some warmth but no exudate or streaking. There is good capillary refill distally. Pulses:  R and L carotid,radial,femoral,dorsalis pedis and posterior tibial pulses are full and equal bilaterally Extremities:  No clubbing, cyanosis, edema, or deformity noted with normal full range of motion of all joints.   Neurologic:  alert & oriented X3, cranial nerves II-XII intact, strength normal in all extremities, sensation intact to light touch, sensation intact to pinprick, gait normal, DTRs symmetrical and normal, finger-to-nose normal, heel-to-shin normal, toes down bilaterally on Babinski, and Romberg negative.   Skin:  turgor normal, color normal, no rashes, no suspicious lesions, no ecchymoses, no petechiae, no purpura, no ulcerations, and no edema.   Cervical Nodes:  no anterior cervical adenopathy and no posterior cervical adenopathy.   Psych:  Cognition and judgment appear intact. Alert and cooperative with normal attention span and concentration. No apparent delusions, illusions, hallucinations Additional Exam:  the left great toe and the surrounding area was cleaned with betadine and draped in sterile fashion. anesthesia was obtained with a digital block - 0.5 cc of plain 2% lidocaine in each quadrant at the MTP joint. after 15 minute wait time the distal toe is numb and still has good capillary refill. using small scissors and  blunt-nosed forceps the ingrown aspect of the toenail was removed without complication or blood loss. No exudate was seen during the removal. Neosporin and a dressing were applied and she still has good capillary refill distally. She tolerated the procedure well.   Impression & Recommendations:  Problem # 1:  CELLULITIS, LEFT GREAT TOE (ICD-681.10) Assessment New  Her updated medication list for this problem includes:    Cefuroxime Axetil 500 Mg Tabs (Cefuroxime axetil) ..... One by mouth two times a day for 10 days  Problem # 2:  INGROWN TOENAIL, INFECTED (ICD-703.0) Assessment: New  Her updated medication list for this problem includes:    Cefuroxime Axetil 500 Mg Tabs (Cefuroxime axetil) ..... One by mouth two times a day for 10 days  Orders: Nail avulsion, partial or complete (11730)  Complete Medication List: 1)  Lorazepam 2 Mg Tabs (Lorazepam) .... Take 1 tab by mouth two times a day as needed for anxiety 2)  Ambien 10 Mg Tabs (Zolpidem tartrate) .... At bedtime 3)  Macrodantin 100 Mg Caps (Nitrofurantoin macrocrystal) .... As needed 4)  Oxybutynin Chloride 10 Mg Xr24h-tab (Oxybutynin chloride) 5)  Hydrocodone-acetaminophen 5-500 Mg Tabs (Hydrocodone-acetaminophen) .... One by mouth three times a day as needed hip pain 6)  Benefiber  7)  Promethazine Hcl 12.5 Mg Tabs (Promethazine hcl) .Marland Kitchen.. 1 every 6 hours as needed 8)  Trazodone Hcl 50 Mg Tabs (Trazodone hcl) .Marland Kitchen.. 1-2 by mouth at bedtime for insomnia 9)  Mirtazapine 15 Mg Tabs (Mirtazapine) .... One  by mouth at bedtime for depression 10)  Phendimetrazine Tartrate 105 Mg Xr24h-cap (Phendimetrazine tartrate) .... One by mouth once daily as needed 11)  Colestipol Hcl 1 Gm Tabs (Colestipol hcl) .... 2 by mouth two times a day 12)  Dexilant 60 Mg Cpdr (Dexlansoprazole) .... One by mouth once daily as needed for heartburn 13)  Cefuroxime Axetil 500 Mg Tabs (Cefuroxime axetil) .... One by mouth two times a day for 10 days  Sherri Weber  Instructions: 1)  Please schedule a follow-up appointment in 2 -3 days. 2)  Take your antibiotic as prescribed until ALL of it is gone, but stop if you develop a rash or swelling and contact our office as soon as possible. Prescriptions: CEFUROXIME AXETIL 500 MG TABS (CEFUROXIME AXETIL) One by mouth two times a day for 10 days  #20 x 0   Entered and Authorized by:   [name] MD   Signed by:   [name] MD on 11/29/2010   Method used:   Electronically to        [facility] [phone]* (retail)       [address] [place], [place]  [address]       Ph: [phone]       Fax: [phone]   RxID:   [id number]    Orders Added: 1)  Nail avulsion, partial or complete [11730] 2)  Est. Sherri Weber Level IV [24401]

## 2011-01-25 NOTE — Progress Notes (Signed)
Summary: med refill  Phone Note Refill Request Message from:  Pharmacy on June 21, 2010 10:49 AM  Refills Requested: Medication #1:  LORAZEPAM 2 MG TABS Take 1 tab by mouth two times a day as needed for anxiety   Dosage confirmed as above?Dosage Confirmed   Supply Requested: 1 month   Last Refilled: 03/03/2010  Medication #2:  AMBIEN 10 MG TABS at bedtime   Dosage confirmed as above?Dosage Confirmed   Supply Requested: 1 month   Last Refilled: 03/03/2010 Patient last seen 05/27/2010. Is this ok to refill for pt?  Initial call taken by: Rock Nephew CMA,  June 21, 2010 10:50 AM  Follow-up for Phone Call        ok Follow-up by: [name] MD,  June 21, 2010 10:54 AM    Prescriptions: AMBIEN 10 MG TABS (ZOLPIDEM TARTRATE) at bedtime  #30 x 5   Entered by:   Rock Nephew CMA   Authorized by:   [name] MD   Signed by:   Rock Nephew CMA on 06/21/2010   Method used:   Telephoned to ...       [address] [phone]* (retail)       [address] [place], [place]  [address]       Ph: [phone]       Fax: [phone]   RxID:   [id number] LORAZEPAM 2 MG TABS (LORAZEPAM) Take 1 tab by mouth two times a day as needed for anxiety  #60 x 5   Entered by:   Rock Nephew CMA   Authorized by:   [name] MD   Signed by:   Rock Nephew CMA on 06/21/2010   Method used:   Telephoned to ...       [address] [phone]* (retail)       [address] [place], [place]  [address]       Ph: [phone]       Fax: [phone]   RxID:   [id number]

## 2011-01-25 NOTE — Letter (Signed)
Summary: [facility]   Imported By: Lester Lacona 03/24/2010 11:41:41  _____________________________________________________________________  External Attachment:    Type:   Image     Comment:   External Document

## 2011-01-25 NOTE — Assessment & Plan Note (Signed)
Summary: PER PT MAR APPT---STC   Vital Signs:  Patient profile:   77 year old female Height:      66 inches Weight:      141 pounds BMI:     22.84 Temp:     97.7 degrees F oral Pulse rate:   60 / minute Pulse rhythm:   regular BP sitting:   130 / 88  (left arm) Cuff size:   regular  Vitals Entered By: Rock Nephew CMA (March 04, 2009 1:12 PM) CC: Depression Is Patient Diabetic? No Pain Assessment Patient in pain? no        Primary Care Provider:  [name] MD   History of Present Illness: Pt. returns for refills on Ambien and Ativan. Her degree of insomnia is unchanged. She has had more anxiety as a result of her being forced to move out of her apt. due to dispute about an Technical brewer. She does not feel depressed and does not want to restart Zoloft.  Depression History:      Positive alarm features for depression include significant weight gain and insomnia.  However, she denies significant weight loss, hypersomnia, psychomotor agitation, psychomotor retardation, fatigue (loss of energy), feelings of worthlessness (guilt), impaired concentration (indecisiveness), and recurrent thoughts of death or suicide.  The patient denies symptoms of a manic disorder including persistently & abnormally elevated mood, abnormally & persistently irritable mood, less need for sleep, talkative or feels need to keep talking, distractibility, flight of ideas, increase in goal-directed activity, psychomotor agitation, inflated self-esteem or grandiosity, excessive buying sprees, excessive sexual indiscretions, and excessive foolish business investments.        Psychosocial stress factors include major life changes.  The patient denies that she feels like life is not worth living, denies that she wishes that she were dead, and denies that she has thought about ending her life.         Current Medications (verified): 1)  Lorazepam 2 Mg Tabs (Lorazepam) .... Take 1 Tab By Mouth At  Bedtime 2)  Ambien 10 Mg Tabs (Zolpidem Tartrate) .... At Bedtime 3)  Macrodantin 100 Mg Caps (Nitrofurantoin Macrocrystal) .... As Needed 4)  Colestid 1 Gm Tabs (Colestipol Hcl) .... Take 2 Tablets By Mouth Two Times A Day 5)  Gabapentin 100 Mg Caps (Gabapentin) .... Take 1 Tablet By Mouth Three Times A Day 6)  Oxybutynin Chloride 10 Mg Xr24h-Tab (Oxybutynin Chloride) 7)  Hydrocodone-Acetaminophen 5-500 Mg Tabs (Hydrocodone-Acetaminophen) .... As Needed 8)  Benefiber  Allergies (verified): No Known Drug Allergies  Past Medical History:    Reviewed history from 01/28/2009 and no changes required:       DEPRESSIVE DISORDER (ICD-311)       DIARRHEA (ICD-787.91)       FATIGUE (ICD-780.79)       DIARRHEA (ICD-787.91)       ABDOMINAL DISTENSION (ICD-787.3)       HEMORRHOIDS, INTERNAL (ICD-455.0)       CONSTIPATION, CHRONIC (ICD-564.09)       CARCINOMA, COLON, FAMILY HX (ICD-V16.0)       PROLAPSE, RECTAL (ICD-569.1)       ULCER, RECTUM (ICD-569.41)       IBS (ICD-564.1)  Past Surgical History:    Reviewed history from 09/09/2008 and no changes required:       Cholecystectomy       Hysterectomy       Appendectomy -1998       S/P RIGHT HEMICOLECTOMY,CECECTOMY  Family History:    Reviewed  history from 08/20/2008 and no changes required:       Family History of Colon Cancer: Sister       Family History of Diabetes: Sister  Social History:    Reviewed history from 11/06/2008 and no changes required:       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   GI:  Denies abdominal pain, bloody stools, change in bowel habits, constipation, dark tarry stools, diarrhea,  excessive appetite, gas, hemorrhoids, indigestion, loss of appetite, nausea, vomiting, vomiting blood, and yellowish skin color.  Physical Exam  General:  alert, well-developed, well-nourished, and well-hydrated.   Eyes:  No corneal or conjunctival inflammation noted. EOMI. Perrla. Funduscopic exam benign, without hemorrhages, exudates or papilledema. Vision grossly normal. Neck:  supple, full ROM, no masses, and no thyromegaly.   Lungs:  Normal respiratory effort, chest expands symmetrically. Lungs are clear to auscultation, no crackles or wheezes. Heart:  Normal rate and regular rhythm. S1 and S2 normal without gallop, murmur, click, rub or other extra sounds. Abdomen:  soft, non-tender, normal bowel sounds, no distention, no masses, no guarding, no rigidity, no rebound tenderness, no abdominal hernia, no hepatomegaly, and no splenomegaly.   Msk:  normal ROM, no joint tenderness, and no joint swelling.   Pulses:  R and L carotid,radial,femoral,dorsalis pedis and posterior tibial pulses are full and equal bilaterally Extremities:  No clubbing, cyanosis, edema, or deformity noted with normal full range of motion of all joints.   Neurologic:  No cranial nerve deficits noted. Station and gait are normal. Plantar reflexes are down-going bilaterally. DTRs are symmetrical throughout. Sensory, motor and coordinative functions appear intact. Skin:  turgor normal, color normal, no rashes, and no suspicious lesions.   Psych:  Oriented X3, memory intact for recent and remote, normally interactive, good eye contact, not depressed appearing, not agitated, and slightly anxious.     Impression & Recommendations:  Problem # 1:  INSOMNIA (ICD-780.52) Assessment Unchanged  Her updated medication list for this problem includes:    Ambien 10 Mg Tabs (Zolpidem tartrate) .Marland Kitchen... At bedtime  Problem # 2:  DEPRESSIVE DISORDER (ICD-311) Assessment: Improved  Her updated medication list for this problem  includes:    Lorazepam 2 Mg Tabs (Lorazepam) .Marland Kitchen... Take 1 tab by mouth two times a day as needed for anxiety  Problem # 3:  IBS (ICD-564.1) Assessment: Improved  Complete Medication List: 1)  Lorazepam 2 Mg Tabs (Lorazepam) .... Take 1 tab by mouth two times a day as needed for anxiety 2)  Ambien 10 Mg Tabs (Zolpidem tartrate) .... At bedtime 3)  Macrodantin 100 Mg Caps (Nitrofurantoin macrocrystal) .... As needed 4)  Colestid 1 Gm Tabs (Colestipol hcl) .... Take 2 tablets by mouth two times a day 5)  Gabapentin 100 Mg Caps (Gabapentin) .... Take 1 tablet by mouth three times a day 6)  Oxybutynin Chloride 10 Mg Xr24h-tab (Oxybutynin chloride) 7)  Hydrocodone-acetaminophen 5-500 Mg Tabs (Hydrocodone-acetaminophen) .... As needed 8)  Benefiber   Patient Instructions: 1)  Please schedule a follow-up appointment in 2 months. 2)  It is important that you exercise regularly at least 20 minutes 5 times  a week. If you develop chest pain, have severe difficulty breathing, or feel very tired , stop exercising immediately and seek medical attention. 3)  You need to lose weight. Consider a lower calorie diet and regular exercise.  4)  It is not healthy  for men to drink more than 2-3 drinks per day or for women to drink more than 1-2 drinks per day. 5)  The medication list was reviewed and reconciled.  All changed / newly prescribed medications were explained.  A complete medication list was provided to the patient / caregiver.  Prescriptions: AMBIEN 10 MG TABS (ZOLPIDEM TARTRATE) at bedtime  #30 x 3   Entered and Authorized by:   [name] MD   Signed by:   [name] MD on 03/04/2009   Method used:   Print then Give to Patient   RxID:   [id number] LORAZEPAM 2 MG TABS (LORAZEPAM) Take 1 tab by mouth two times a day as needed for anxiety  #60 x 3   Entered and Authorized by:   [name] MD   Signed by:   [name] MD on 03/04/2009   Method used:   Print then Give to  Patient   RxID:   [id number]        Preventive Care Screening  Last Tetanus Booster:    Date:  01/26/2002    Results:  given

## 2011-01-25 NOTE — Procedures (Signed)
Summary: Gastroenterology FLEX SIG (done9/22/06)  Gastroenterology FLEX SIG   Imported By: Lowry Ram CMA 04/22/2008 15:31:10  _____________________________________________________________________  External Attachment:    Type:   Image     Comment:   External Document

## 2011-01-25 NOTE — Letter (Signed)
Summary: [facility]   Imported By: Lester [place] 10/26/2009 09:01:09  _____________________________________________________________________  External Attachment:    Type:   Image     Comment:   External Document

## 2011-01-25 NOTE — Assessment & Plan Note (Signed)
Summary: LOOSE BOWEL---D/T---STC   Vital Signs:  Patient profile:   77 year old female Menstrual status:  regular Height:      65.5 inches Weight:      129.75 pounds O2 Sat:      95 % on Room air Temp:     96.4 degrees F oral Pulse rate:   79 / minute Pulse rhythm:   regular Resp:     16 per minute BP sitting:   118 / 70  (left arm) Cuff size:   regular  Vitals Entered By: Jarome Lamas (October 05, 2010 10:00 AM)  O2 Flow:  Room air CC: loose stool/pb, Diarrhea   Primary Care Provider:  Sanda Linger, MD  CC:  loose stool/pb and Diarrhea.  History of Present Illness:  Diarrhea      This is a 77 year old woman who presents with Diarrhea.  The symptoms began 4 weeks ago.  The severity is described as mild.  The patient reports 3 stools or less per day, semiformed/loose stools, fecal urgency, alternating diarrhea/constipation, and gradual onset of symptoms, but denies watery/unformed stools, voluminous stools, blood in stool, mucus in stool, greasy stools, malodorous stools, fecal soiling, nocturnal diarrhea, fasting diarrhea, bloating, gassiness, and abrupt onset of symptoms.  The patient denies fever, abdominal pain, abdominal cramps, nausea, vomiting, lightheadedness, increased thirst, weight loss, joint pains, mouth ulcers, and eye redness.  The symptoms are better with hypomotility agents.  Patient's risk factors for diarrhea include laxative use and recent antibiotic use.  Patient has a  history of irritable bowel syndrome and bowel resection.    Current Medications (verified): 1)  Lorazepam 2 Mg Tabs (Lorazepam) .... Take 1 Tab By Mouth Two Times A Day As Needed For Anxiety 2)  Ambien 10 Mg Tabs (Zolpidem Tartrate) .... At Bedtime 3)  Macrodantin 100 Mg Caps (Nitrofurantoin Macrocrystal) .... As Needed 4)  Oxybutynin Chloride 10 Mg Xr24h-Tab (Oxybutynin Chloride) 5)  Hydrocodone-Acetaminophen 5-500 Mg Tabs (Hydrocodone-Acetaminophen) .... One By Mouth Three Times A Day As  Needed Hip Pain 6)  Benefiber 7)  Promethazine Hcl 12.5 Mg Tabs (Promethazine Hcl) .Marland Kitchen.. 1 Every 6 Hours As Needed 8)  Trazodone Hcl 50 Mg Tabs (Trazodone Hcl) .Marland Kitchen.. 1-2 By Mouth At Bedtime For Insomnia 9)  Mirtazapine 15 Mg Tabs (Mirtazapine) .... One By Mouth At Bedtime For Depression 10)  Phendimetrazine Tartrate 105 Mg Xr24h-Cap (Phendimetrazine Tartrate) .... One By Mouth Once Daily As Needed 11)  Polyethylene Glycol 3350  Powd (Polyethylene Glycol 3350) .... Use As Directed  Allergies (verified): No Known Drug Allergies  Past History:  Past Medical History: Last updated: 06/02/2009 DEPRESSIVE DISORDER (ICD-311) DIARRHEA (ICD-787.91) FATIGUE (ICD-780.79) DIARRHEA (ICD-787.91) ABDOMINAL DISTENSION (ICD-787.3) HEMORRHOIDS, INTERNAL (ICD-455.0) CONSTIPATION, CHRONIC (ICD-564.09) CARCINOMA, COLON, FAMILY HX (ICD-V16.0) PROLAPSE, RECTAL (ICD-569.1) ULCER, RECTUM (ICD-569.41) IBS (ICD-564.1) Osteoarthritis Low back pain  Past Surgical History: Last updated: 09/09/2008 Cholecystectomy Hysterectomy Appendectomy -1998 S/P RIGHT HEMICOLECTOMY,CECECTOMY  Family History: Last updated: 08/20/2008 Family History of Colon Cancer: Sister Family History of Diabetes: Sister  Social History: Last updated: 11/06/2008 Occupation: Retired Illicit Drug Use - no Never Smoked Regular exercise-no Alcohol use-no  Risk Factors: Alcohol Use: 0 (08/24/2010) Exercise: yes (08/24/2010)  Risk Factors: Smoking Status: never (08/24/2010) Passive Smoke Exposure: no (08/24/2010)  Family History: Reviewed history from 08/20/2008 and no changes required. Family History of Colon Cancer: Sister Family History of Diabetes: Sister  Social History: Reviewed history from 11/06/2008 and no changes required. 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   Psych:  Complains of anxiety; denies alternate hallucination ( auditory/visual), depression, easily angered, easily tearful, irritability, mental problems, panic attacks, sense of great danger, suicidal thoughts/plans, thoughts of violence, unusual visions or sounds, and thoughts /plans of harming others.  Physical Exam  General:  alert, well-developed, well-nourished, well-hydrated, appropriate dress, normal appearance, healthy-appearing, and cooperative to examination.   Head:  Normocephalic and atraumatic without obvious abnormalities. No apparent alopecia or balding. Eyes:  vision grossly intact, pupils equal, pupils round, and pupils reactive to light.   Mouth:  Oral mucosa and oropharynx without lesions or exudates.  Teeth in good repair. Neck:  supple and no masses.   Lungs:  normal respiratory effort and normal breath sounds.   Heart:  normal rate and regular rhythm.   Abdomen:  soft, non-tender, normal bowel sounds, no distention, no masses, no guarding, no rigidity, no rebound tenderness, no abdominal hernia, no inguinal hernia, no hepatomegaly, and no splenomegaly.   Msk:  No deformity or scoliosis noted of thoracic or lumbar spine.   Pulses:  R and L carotid,radial,femoral,dorsalis pedis and posterior tibial pulses are full and equal bilaterally Extremities:  No clubbing, cyanosis, edema, or deformity noted with normal full range of motion of all joints.   Neurologic:  No cranial nerve deficits noted. Station and gait are normal. Plantar reflexes are down-going bilaterally. DTRs are symmetrical throughout. Sensory, motor and coordinative functions appear intact. Skin:  Intact without suspicious lesions or rashes Cervical Nodes:  no anterior cervical adenopathy  and no posterior cervical adenopathy.   Axillary Nodes:  no R axillary adenopathy and no L axillary adenopathy.   Inguinal Nodes:  no R inguinal adenopathy and no L inguinal adenopathy.   Psych:  Cognition and judgment appear intact. Alert and cooperative with normal attention span and concentration. No apparent delusions, illusions, hallucinations   Impression & Recommendations:  Problem # 1:  DIARRHEA OF PRESUMED INFECTIOUS ORIGIN (ICD-009.3) Assessment Deteriorated start colestipol, will screen for infection, she is having a colonoscopy soon with her GI at [facility] Orders: T-Culture, Stool ([id number]) T-Stool for O&P ([id number]) T-Culture, C-Diff Toxin A/B ([id number])  Problem # 2:  MAJOR DPRSV DISORDER RECURRENT EPISODE MODERATE (ICD-296.32) Assessment: Improved  Complete Medication List: 1)  Lorazepam 2 Mg Tabs (Lorazepam) .... Take 1 tab by mouth two times a day as needed for anxiety 2)  Ambien 10 Mg Tabs (Zolpidem tartrate) .... At bedtime 3)  Macrodantin 100 Mg Caps (Nitrofurantoin macrocrystal) .... As needed 4)  Oxybutynin Chloride 10 Mg Xr24h-tab (Oxybutynin chloride) 5)  Hydrocodone-acetaminophen 5-500 Mg Tabs (Hydrocodone-acetaminophen) .... One by mouth three times a day as needed hip pain 6)  Benefiber  7)  Promethazine Hcl 12.5 Mg Tabs (Promethazine hcl) .Marland Kitchen.. 1 every 6 hours as needed 8)  Trazodone Hcl 50 Mg Tabs (Trazodone hcl) .Marland Kitchen.. 1-2 by mouth at bedtime for insomnia 9)  Mirtazapine 15 Mg Tabs (Mirtazapine) .... One by mouth at bedtime for depression 10)  Phendimetrazine Tartrate 105 Mg Xr24h-cap (Phendimetrazine tartrate) .... One by mouth once daily as needed 11)  Colestipol Hcl 1 Gm Tabs (Colestipol hcl) .... 2 by mouth two times a day  Patient Instructions: 1)  Please schedule a follow-up appointment in 2 weeks. 2)  teh main problem  with gastroenteritis is dehydration. Drink plenty of fluids and take solids as you feel better. If you are unable to keep  anything down and/or you show signs of dehydration(dry/cracked lips, lack of tears, not urinating, very sleepy), call our office. Prescriptions: PHENDIMETRAZINE TARTRATE 105 MG XR24H-CAP (PHENDIMETRAZINE TARTRATE) One by mouth once daily as needed  #30 x 2   Entered and Authorized by:   [name] MD   Signed by:   [name] MD on 10/05/2010   Method used:   Print then Give to Patient   RxID:   [id number] COLESTIPOL HCL 1 GM TABS (COLESTIPOL HCL) 2 by mouth two times a day  #180 x 3   Entered and Authorized by:   [name] MD   Signed by:   [name] MD on 10/05/2010   Method used:   Electronically to        [facility] [phone]* (retail)       [address] [place], [place]  [address]       Ph: [phone]       Fax: [phone]   RxID:   [id number]

## 2011-01-25 NOTE — Assessment & Plan Note (Signed)
Summary: 4 mos f/u #/cd   Vital Signs:  Patient profile:   77 year old female Menstrual status:  regular Height:      66 inches Weight:      131 pounds BMI:     21.22 O2 Sat:      95 % on Room air Temp:     97.2 degrees F oral Pulse rate:   76 / minute Pulse rhythm:   regular Resp:     16 per minute BP sitting:   120 / 80  (left arm) Cuff size:   large  Vitals Entered By: Rock Nephew CMA (December 28, 2009 9:37 AM)  O2 Flow:  Room air CC: 78mo follow up Is Patient Diabetic? No Pain Assessment Patient in pain? no        Primary Care Provider:  Sanda Linger, MD  CC:  78mo follow up.  History of Present Illness: She returns for f/up with no complaints.  Preventive Screening-Counseling & Management  Alcohol-Tobacco     Alcohol drinks/day: 0     Smoking Status: never     Passive Smoke Exposure: no  Current Medications (verified): 1)  Lorazepam 2 Mg Tabs (Lorazepam) .... Take 1 Tab By Mouth Two Times A Day As Needed For Anxiety 2)  Ambien 10 Mg Tabs (Zolpidem Tartrate) .... At Bedtime 3)  Macrodantin 100 Mg Caps (Nitrofurantoin Macrocrystal) .... As Needed 4)  Colestid 1 Gm Tabs (Colestipol Hcl) .... Take 2 Tablets By Mouth Two Times A Day 5)  Gabapentin 100 Mg Caps (Gabapentin) .... Take 1 Tablet By Mouth Three Times A Day 6)  Oxybutynin Chloride 10 Mg Xr24h-Tab (Oxybutynin Chloride) 7)  Hydrocodone-Acetaminophen 5-500 Mg Tabs (Hydrocodone-Acetaminophen) .... One By Mouth Three Times A Day As Needed Hip Pain 8)  Benefiber 9)  Promethazine Hcl 12.5 Mg Tabs (Promethazine Hcl) .Marland Kitchen.. 1 Every 6 Hours As Needed 10)  Trazodone Hcl 50 Mg Tabs (Trazodone Hcl) .Marland Kitchen.. 1-2 By Mouth At Bedtime For Insomnia 11)  Anucort-Hc 25 Mg Supp (Hydrocortisone Acetate) .... Insert 1 Suppository Into Rectum At Bedtime 12)  Phendimetrazine 105 Mg .... Take 1 Tablet By Mouth Every Morning As Needed 13)  Restoril 30 Mg Caps (Temazepam) .... Take 1 Tablet By Mouth At Bedtime As Needed For  Sleep. 14)  Pristiq 50 Mg Xr24h-Tab (Desvenlafaxine Succinate) .... Once Daily  Allergies (verified): No Known Drug Allergies  Past History:  Past Medical History: Reviewed history from 06/02/2009 and no changes required. DEPRESSIVE DISORDER (ICD-311) DIARRHEA (ICD-787.91) FATIGUE (ICD-780.79) DIARRHEA (ICD-787.91) ABDOMINAL DISTENSION (ICD-787.3) HEMORRHOIDS, INTERNAL (ICD-455.0) CONSTIPATION, CHRONIC (ICD-564.09) CARCINOMA, COLON, FAMILY HX (ICD-V16.0) PROLAPSE, RECTAL (ICD-569.1) ULCER, RECTUM (ICD-569.41) IBS (ICD-564.1) Osteoarthritis Low back pain  Past Surgical History: Reviewed history from 09/09/2008 and no changes required. Cholecystectomy Hysterectomy Appendectomy -1998 S/P RIGHT HEMICOLECTOMY,CECECTOMY  Family History: Reviewed history from 08/20/2008 and no changes required. Family History of Colon Cancer: Sister Family History of Diabetes: Sister  Social History: Reviewed history from 11/06/2008 and no changes required. 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   GU:  Denies abnormal vaginal bleeding, dysuria, genital sores, hematuria, incontinence, nocturia, urinary frequency, and urinary hesitancy.  Physical Exam  General:  alert and oriented. Head:  normocephalic and atraumatic.   Mouth:  no gingival abnormalities, pharynx pink and moist, no erythema, and  no exudates.   Neck:  supple, full ROM, no masses, no thyromegaly, no carotid bruits, no cervical lymphadenopathy, and no neck tenderness.   Lungs:  Clear throughout to auscultation. Heart:  Regular rate and rhythm; no murmurs, rubs or bruits. Abdomen:  soft abdomen with normal active bowel sounds, decreased muscle tone. Liver edge at  costal margin. No palpable mass or tenderness. Msk:  normal ROM, no joint tenderness, no joint swelling, no joint warmth, and no redness over joints.   Extremities:  No clubbing, cyanosis, edema or deformities noted. Skin:  turgor normal, color normal, no rashes, no suspicious lesions, no ecchymoses, no petechiae, no purpura, no ulcerations, and no edema.   Cervical Nodes:  no anterior cervical adenopathy and no posterior cervical adenopathy.   Psych:  Oriented X3, memory intact for recent and remote, normally interactive, good eye contact, not anxious appearing, not depressed appearing, not agitated, not suicidal, and not homicidal.     Impression & Recommendations:  Problem # 1:  UTI (ICD-599.0) Assessment Improved  Her updated medication list for this problem includes:    Macrodantin 100 Mg Caps (Nitrofurantoin macrocrystal) .Marland Kitchen... As needed    Oxybutynin Chloride 10 Mg Xr24h-tab (Oxybutynin chloride)  Problem # 2:  LOW BACK PAIN (ICD-724.2) Assessment: Unchanged  Her updated medication list for this problem includes:    Hydrocodone-acetaminophen 5-500 Mg Tabs (Hydrocodone-acetaminophen) ..... One by mouth three times a day as needed hip pain  Problem # 3:  MAJOR DPRSV DISORDER RECURRENT EPISODE MODERATE (ICD-296.32) Assessment: Improved  Complete Medication List: 1)  Lorazepam 2 Mg Tabs (Lorazepam) .... Take 1 tab by mouth two times a day as needed for anxiety 2)  Ambien 10 Mg Tabs (Zolpidem tartrate) .... At bedtime 3)  Macrodantin 100 Mg Caps (Nitrofurantoin macrocrystal) .... As needed 4)  Colestid 1 Gm Tabs (Colestipol hcl) .... Take 2 tablets by mouth two times a day 5)  Gabapentin 100 Mg Caps (Gabapentin) .... Take 1 tablet by mouth three times a day 6)  Oxybutynin Chloride 10 Mg Xr24h-tab (Oxybutynin chloride) 7)  Hydrocodone-acetaminophen 5-500 Mg Tabs (Hydrocodone-acetaminophen) .... One by mouth three times a day as needed hip pain 8)  Benefiber  9)  Promethazine Hcl  12.5 Mg Tabs (Promethazine hcl) .Marland Kitchen.. 1 every 6 hours as needed 10)  Trazodone Hcl 50 Mg Tabs (Trazodone hcl) .Marland Kitchen.. 1-2 by mouth at bedtime for insomnia 11)  Anucort-hc 25 Mg Supp (Hydrocortisone acetate) .... Insert 1 suppository into rectum at bedtime  Other Orders: Radiology Referral (Radiology)  Patient Instructions: 1)  Please schedule a follow-up appointment in 4 months. 2)  It is important that you exercise regularly at least 20 minutes 5 times a week. If you develop chest pain, have severe difficulty breathing, or feel very tired , stop exercising immediately and seek medical attention. 3)  You need to lose weight. Consider a lower calorie diet and regular exercise.  4)  Schedule your mammogram.   Not Administered:    Influenza Vaccine not given due to: declined

## 2011-01-27 NOTE — Assessment & Plan Note (Signed)
Summary: left big toe red,hurts/cd   Vital Signs:  Patient profile:   77 year old female Menstrual status:  postmenopausal Height:      65.5 inches Weight:      132 pounds BMI:     21.71 O2 Sat:      94 % on Room air Temp:     97.6 degrees F oral Pulse rate:   77 / minute Pulse rhythm:   regular Resp:     16 per minute BP sitting:   130 / 80  (left arm) Cuff size:   large  Vitals Entered By: Rock Nephew CMA (December 08, 2010 9:31 AM)  O2 Flow:  Room air CC: Patient c/o recheck L toe and dizziness, Hypertension Management Is Patient Diabetic? No Pain Assessment Patient in pain? no       Does patient need assistance? Functional Status Self care Ambulation Normal   Primary Care Provider:  Sanda Linger, MD  CC:  Patient c/o recheck L toe and dizziness and Hypertension Management.  History of Present Illness: She returns for f/up and she is pleased to report that her left great toe is healing well with no more pain, redness, or swelling. She has been trimming the toenail.  She continues to have dizziness and staggering and she wants to get the MRI of her brain done.  She has been having episodes of tingling for several years now and she tells me that Dr. Lynnae Sandhoff gave her neurontin one year ago and she took it but she says that it did not help much.  She called last week to report a brief episode of nausea and diarrhea that has since resolved.  She has decided to see a Psychiatrist and she has the names of 2 people she will be calling.  Hypertension History:      She denies headache, chest pain, palpitations, dyspnea with exertion, orthopnea, PND, peripheral edema, visual symptoms, neurologic problems, syncope, and side effects from treatment.  She notes no problems with any antihypertensive medication side effects.        Positive major cardiovascular risk factors include female age 53 years old or older, hyperlipidemia, and hypertension.  Negative major  cardiovascular risk factors include negative family history for ischemic heart disease and non-tobacco-user status.        Further assessment for target organ damage reveals no history of ASHD, cardiac end-organ damage (CHF/LVH), stroke/TIA, peripheral vascular disease, renal insufficiency, or hypertensive retinopathy.     Preventive Screening-Counseling & Management  Alcohol-Tobacco     Alcohol drinks/day: 0     Alcohol Counseling: not indicated; patient does not drink     Smoking Status: never     Passive Smoke Exposure: no     Tobacco Counseling: not indicated; no tobacco use  Hep-HIV-STD-Contraception     Hepatitis Risk: no risk noted     HIV Risk: no     STD Risk: no risk noted     Dental Visit-last 6 months yes     Dental Care Counseling: to seek dental care; no dental care within six months     SBE monthly: yes     SBE Education/Counseling: to perform regular SBE     Sun Exposure-Excessive: no      Sexual History:  not active.        Drug Use:  never.        Blood Transfusions:  no.    Clinical Review Panels:  Prevention   Last Mammogram:  ASSESSMENT: Negative -  BI-RADS 1^MM DIGITAL SCREENING (01/19/2010)   Last Colonoscopy:  Normal (11/09/2005)  Immunizations   Last Tetanus Booster:  given (01/26/2002)   Last Zoster Vaccine:  Zostavax (08/24/2010)  Lipid Management   Cholesterol:  186 (06/02/2009)   LDL (bad choesterol):  122 (06/02/2009)   HDL (good cholesterol):  30.90 (06/02/2009)  Diabetes Management   Creatinine:  0.9 (11/01/2010)  CBC   WBC:  5.1 (11/01/2010)   RBC:  3.74 (11/01/2010)   Hgb:  12.1 (11/01/2010)   Hct:  34.6 (11/01/2010)   Platelets:  258.0 (11/01/2010)   MCV  92.5 (11/01/2010)   MCHC  34.8 (11/01/2010)   RDW  13.2 (11/01/2010)   PMN:  78.3 (11/01/2010)   Lymphs:  13.0 (11/01/2010)   Monos:  6.1 (11/01/2010)   Eosinophils:  1.7 (11/01/2010)   Basophil:  0.9 (11/01/2010)  Complete Metabolic Panel   Glucose:  104 (11/01/2010)    Sodium:  140 (11/01/2010)   Potassium:  4.8 (11/01/2010)   Chloride:  106 (11/01/2010)   CO2:  29 (11/01/2010)   BUN:  19 (11/01/2010)   Creatinine:  0.9 (11/01/2010)   Albumin:  3.6 (11/01/2010)   Total Protein:  6.2 (11/01/2010)   Calcium:  9.4 (11/01/2010)   Total Bili:  0.4 (11/01/2010)   Alk Phos:  62 (11/01/2010)   SGPT (ALT):  12 (11/01/2010)   SGOT (AST):  16 (11/01/2010)   Medications Prior to Update: 1)  Lorazepam 2 Mg Tabs (Lorazepam) .... Take 1 Tab By Mouth Two Times A Day As Needed For Anxiety 2)  Ambien 10 Mg Tabs (Zolpidem Tartrate) .... At Bedtime 3)  Macrodantin 100 Mg Caps (Nitrofurantoin Macrocrystal) .... As Needed 4)  Oxybutynin Chloride 10 Mg Xr24h-Tab (Oxybutynin Chloride) 5)  Hydrocodone-Acetaminophen 5-500 Mg Tabs (Hydrocodone-Acetaminophen) .... One By Mouth Three Times A Day As Needed Hip Pain 6)  Benefiber 7)  Promethazine Hcl 12.5 Mg Tabs (Promethazine Hcl) .Marland Kitchen.. 1 Every 6 Hours As Needed 8)  Trazodone Hcl 50 Mg Tabs (Trazodone Hcl) .Marland Kitchen.. 1-2 By Mouth At Bedtime For Insomnia 9)  Mirtazapine 15 Mg Tabs (Mirtazapine) .... One By Mouth At Bedtime For Depression 10)  Phendimetrazine Tartrate 105 Mg Xr24h-Cap (Phendimetrazine Tartrate) .... One By Mouth Once Daily As Needed 11)  Colestipol Hcl 1 Gm Tabs (Colestipol Hcl) .... 2 By Mouth Two Times A Day 12)  Dexilant 60 Mg Cpdr (Dexlansoprazole) .... One By Mouth Once Daily As Needed For Heartburn 13)  Cefuroxime Axetil 500 Mg Tabs (Cefuroxime Axetil) .... One By Mouth Two Times A Day For 10 Days  Current Medications (verified): 1)  Lorazepam 2 Mg Tabs (Lorazepam) .... Take 1 Tab By Mouth Two Times A Day As Needed For Anxiety 2)  Ambien 10 Mg Tabs (Zolpidem Tartrate) .... At Bedtime 3)  Macrodantin 100 Mg Caps (Nitrofurantoin Macrocrystal) .... As Needed 4)  Oxybutynin Chloride 10 Mg Xr24h-Tab (Oxybutynin Chloride) 5)  Hydrocodone-Acetaminophen 5-500 Mg Tabs (Hydrocodone-Acetaminophen) .... One By Mouth Three  Times A Day As Needed Hip Pain 6)  Benefiber 7)  Promethazine Hcl 12.5 Mg Tabs (Promethazine Hcl) .Marland Kitchen.. 1 Every 6 Hours As Needed 8)  Trazodone Hcl 50 Mg Tabs (Trazodone Hcl) .Marland Kitchen.. 1-2 By Mouth At Bedtime For Insomnia 9)  Mirtazapine 15 Mg Tabs (Mirtazapine) .... One By Mouth At Bedtime For Depression 10)  Phendimetrazine Tartrate 105 Mg Xr24h-Cap (Phendimetrazine Tartrate) .... One By Mouth Once Daily As Needed 11)  Colestipol Hcl 1 Gm Tabs (Colestipol Hcl) .... 2 By Mouth Two Times A Day 12)  Dexilant 60 Mg Cpdr (Dexlansoprazole) .... One By Mouth Once Daily As Needed For Heartburn  Allergies (verified): No Known Drug Allergies  Past History:  Past Medical History: Last updated: 06/02/2009 DEPRESSIVE DISORDER (ICD-311) DIARRHEA (ICD-787.91) FATIGUE (ICD-780.79) DIARRHEA (ICD-787.91) ABDOMINAL DISTENSION (ICD-787.3) HEMORRHOIDS, INTERNAL (ICD-455.0) CONSTIPATION, CHRONIC (ICD-564.09) CARCINOMA, COLON, FAMILY HX (ICD-V16.0) PROLAPSE, RECTAL (ICD-569.1) ULCER, RECTUM (ICD-569.41) IBS (ICD-564.1) Osteoarthritis Low back pain  Past Surgical History: Last updated: 09/09/2008 Cholecystectomy Hysterectomy Appendectomy -1998 S/P RIGHT HEMICOLECTOMY,CECECTOMY  Family History: Last updated: 08/20/2008 Family History of Colon Cancer: Sister Family History of Diabetes: Sister  Social History: Last updated: 11/06/2008 Occupation: Retired Illicit Drug Use - no Never Smoked Regular exercise-no Alcohol use-no  Risk Factors: Alcohol Use: 0 (12/08/2010) Exercise: yes (08/24/2010)  Risk Factors: Smoking Status: never (12/08/2010) Passive Smoke Exposure: no (12/08/2010)  Family History: Reviewed history from 08/20/2008 and no changes required. Family History of Colon Cancer: Sister Family History of Diabetes: Sister  Social History: Reviewed history from 11/06/2008 and no changes required. 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  The patient denies anorexia, fever, weight loss, weight gain, chest pain, syncope, dyspnea on exertion, peripheral edema, prolonged cough, headaches, hemoptysis, abdominal pain, melena, hematochezia, severe indigestion/heartburn, hematuria, muscle weakness, suspicious skin lesions, transient blindness, unusual weight change, abnormal bleeding, enlarged lymph nodes, and angioedema.   Neuro:  Complains of disturbances in coordination, numbness, poor balance, and tingling; denies brief paralysis, difficulty with concentration, falling down, headaches, inability to speak, memory loss, seizures, sensation of room spinning, tremors, visual disturbances, and weakness. Psych:  Complains of depression; denies alternate hallucination ( auditory/visual), anxiety, easily angered, easily tearful, irritability, mental problems, panic attacks, sense of great danger, suicidal thoughts/plans, thoughts of violence, unusual visions or sounds, and thoughts /plans of harming others. Heme:  Denies abnormal bruising, bleeding, enlarge lymph nodes, fevers, pallor, and skin discoloration.  Physical Exam  General:  alert, well-developed, well-nourished, well-hydrated, appropriate dress, normal appearance, healthy-appearing, and cooperative to examination.   Head:  normocephalic, atraumatic, no abnormalities observed, and no abnormalities palpated.   Eyes:  vision grossly intact, pupils equal, pupils round, and pupils reactive to light.   Ears:  R ear normal and L ear normal.   Mouth:  Oral mucosa and oropharynx without lesions or exudates.  Teeth in good repair. Neck:  supple, full ROM, no masses, no thyromegaly, no thyroid nodules or tenderness, no JVD, normal carotid upstroke, no carotid bruits, no cervical lymphadenopathy, and no neck tenderness.   Lungs:  normal respiratory effort, no intercostal retractions,  no accessory muscle use, normal breath sounds, no dullness, no fremitus, no crackles, and no wheezes.   Heart:  normal rate, regular rhythm, no murmur, no gallop, and no rub.   Abdomen:  soft, non-tender, normal bowel sounds, no distention, no masses, no guarding, no rigidity, no rebound tenderness, no abdominal hernia, no inguinal hernia, no hepatomegaly, and no splenomegaly.   Msk:  left great toe looks good with no erythema, warmth, ttp, exudate, foreign body, and the nail is intact. there is good sensation and capillary refill distally. Pulses:  R and L carotid,radial,femoral,dorsalis pedis and posterior tibial pulses are full and equal bilaterally Extremities:  No clubbing, cyanosis, edema, or deformity noted with normal full range of motion of all joints.   Neurologic:  alert & oriented X3, cranial nerves II-XII intact, strength normal in all extremities,  sensation intact to light touch, sensation intact to pinprick, gait normal, DTRs symmetrical and normal, finger-to-nose normal, heel-to-shin normal, toes down bilaterally on Babinski, and Romberg negative.   Skin:  turgor normal, color normal, no rashes, no suspicious lesions, no ecchymoses, no petechiae, no purpura, no ulcerations, and no edema.   Cervical Nodes:  no anterior cervical adenopathy and no posterior cervical adenopathy.   Axillary Nodes:  no R axillary adenopathy and no L axillary adenopathy.   Inguinal Nodes:  no R inguinal adenopathy and no L inguinal adenopathy.   Psych:  Cognition and judgment appear intact. Alert and cooperative with normal attention span and concentration. No apparent delusions, illusions, hallucinations   Impression & Recommendations:  Problem # 1:  ANEMIA ASSOCIATED W/OTHER SPEC NUTRITIONAL DEFIC (ICD-281.8) Assessment New  Orders: Venipuncture (04540) TLB-CBC Platelet - w/Differential (85025-CBCD) TLB-B12 + Folate Pnl ([id number]-N56/OZH) TLB-IBC Pnl (Iron/FE;Transferrin) (83550-IBC)  Hgb: 12.1  (11/01/2010)   Hct: 34.6 (11/01/2010)   Platelets: 258.0 (11/01/2010) RBC: 3.74 (11/01/2010)   RDW: 13.2 (11/01/2010)   WBC: 5.1 (11/01/2010) MCV: 92.5 (11/01/2010)   MCHC: 34.8 (11/01/2010) Iron: 70 (08/21/2008)   % Sat: 21.6 (08/21/2008) TSH: 2.15 (11/01/2010)  Problem # 2:  PARESTHESIA (ICD-782.0) Assessment: New  Orders: Venipuncture (08657) TLB-CBC Platelet - w/Differential (85025-CBCD) TLB-B12 + Folate Pnl ([id number]-U13/KGM) TLB-IBC Pnl (Iron/FE;Transferrin) (83550-IBC) Radiology Referral (Radiology)  Problem # 3:  CELLULITIS, LEFT GREAT TOE (ICD-681.10) Assessment: Improved  The following medications were removed from the medication list:    Cefuroxime Axetil 500 Mg Tabs (Cefuroxime axetil) ..... One by mouth two times a day for 10 days  Problem # 4:  ATAXIA (ICD-781.3) Assessment: Unchanged will order an MRI of her brain Orders: Radiology Referral (Radiology)  Problem # 5:  ESSENTIAL HYPERTENSION (ICD-401.9) Assessment: Unchanged  BP today: 130/80 Prior BP: 108/64 (11/29/2010)  10 Yr Risk Heart Disease: 20 %  Labs Reviewed: K+: 4.8 (11/01/2010) Creat: : 0.9 (11/01/2010)   Chol: 186 (06/02/2009)   HDL: 30.90 (06/02/2009)   LDL: 122 (06/02/2009)   TG: 167.0 (06/02/2009)  Problem # 6:  EDEMA- LOCALIZED (ICD-782.3) Assessment: Improved  Discussed elevation of the legs, use of compression stockings, sodium restiction, and medication use.   Complete Medication List: 1)  Lorazepam 2 Mg Tabs (Lorazepam) .... Take 1 tab by mouth two times a day as needed for anxiety 2)  Ambien 10 Mg Tabs (Zolpidem tartrate) .... At bedtime 3)  Macrodantin 100 Mg Caps (Nitrofurantoin macrocrystal) .... As needed 4)  Oxybutynin Chloride 10 Mg Xr24h-tab (Oxybutynin chloride) 5)  Hydrocodone-acetaminophen 5-500 Mg Tabs (Hydrocodone-acetaminophen) .... One by mouth three times a day as needed hip pain 6)  Benefiber  7)  Promethazine Hcl 12.5 Mg Tabs (Promethazine hcl) .Marland Kitchen.. 1 every 6  hours as needed 8)  Trazodone Hcl 50 Mg Tabs (Trazodone hcl) .Marland Kitchen.. 1-2 by mouth at bedtime for insomnia 9)  Mirtazapine 15 Mg Tabs (Mirtazapine) .... One by mouth at bedtime for depression 10)  Phendimetrazine Tartrate 105 Mg Xr24h-cap (Phendimetrazine tartrate) .... One by mouth once daily as needed 11)  Colestipol Hcl 1 Gm Tabs (Colestipol hcl) .... 2 by mouth two times a day 12)  Dexilant 60 Mg Cpdr (Dexlansoprazole) .... One by mouth once daily as needed for heartburn  Hypertension Assessment/Plan:      The patient's hypertensive risk group is category B: At least one risk factor (excluding diabetes) with no target organ damage.  Her calculated 10 year risk of coronary heart disease is 20 %.  Today's blood pressure  is 130/80.  Her blood pressure goal is < 140/90.  Patient Instructions: 1)  Please schedule a follow-up appointment in 2 months. 2)  It is important that you exercise regularly at least 20 minutes 5 times a week. If you develop chest pain, have severe difficulty breathing, or feel very tired , stop exercising immediately and seek medical attention. 3)  You need to lose weight. Consider a lower calorie diet and regular exercise.  4)  Check your Blood Pressure regularly. If it is above 140/90: you should make an appointment.   Orders Added: 1)  Venipuncture [36415] 2)  TLB-CBC Platelet - w/Differential [85025-CBCD] 3)  TLB-B12 + Folate Pnl [[id number]-B12/FOL] 4)  TLB-IBC Pnl (Iron/FE;Transferrin) [83550-IBC] 5)  Radiology Referral [Radiology] 6)  Est. Patient Level IV [52841]

## 2011-02-03 ENCOUNTER — Ambulatory Visit: Payer: Self-pay | Admitting: Internal Medicine

## 2011-02-03 ENCOUNTER — Encounter: Payer: Self-pay | Admitting: Internal Medicine

## 2011-02-03 ENCOUNTER — Ambulatory Visit (INDEPENDENT_AMBULATORY_CARE_PROVIDER_SITE_OTHER): Payer: MEDICARE | Admitting: Internal Medicine

## 2011-02-03 DIAGNOSIS — D538 Other specified nutritional anemias: Secondary | ICD-10-CM

## 2011-02-03 DIAGNOSIS — I10 Essential (primary) hypertension: Secondary | ICD-10-CM

## 2011-02-03 DIAGNOSIS — F331 Major depressive disorder, recurrent, moderate: Secondary | ICD-10-CM

## 2011-02-03 DIAGNOSIS — K219 Gastro-esophageal reflux disease without esophagitis: Secondary | ICD-10-CM

## 2011-02-03 DIAGNOSIS — E669 Obesity, unspecified: Secondary | ICD-10-CM

## 2011-02-10 NOTE — Assessment & Plan Note (Signed)
Summary: f/u appt   Vital Signs:  Patient profile:   77 year old female Menstrual status:  postmenopausal Height:      65.5 inches Weight:      133 pounds BMI:     21.87 O2 Sat:      95 % on Room air Temp:     98.2 degrees F oral Pulse rate:   86 / minute Pulse rhythm:   regular Resp:     16 per minute BP sitting:   120 / 80  (left arm) Cuff size:   regular  Vitals Entered By: Rock Nephew CMA (February 03, 2011 9:10 AM)  O2 Flow:  Room air CC: follow-up visit, Abdominal Pain Is Patient Diabetic? No Pain Assessment Patient in pain? no       Does patient need assistance? Functional Status Self care Ambulation Normal   Primary Care Provider:  Sanda Linger, MD  CC:  follow-up visit and Abdominal Pain.  History of Present Illness:  Follow-Up Visit      This is a 77 year old woman who presents for Follow-up visit.  The patient denies chest pain, palpitations, dizziness, syncope, edema, SOB, DOE, PND, and orthopnea.  Since the last visit the patient notes no new problems or concerns.  The patient reports taking meds as prescribed, monitoring BP, and dietary compliance.  When questioned about possible medication side effects, the patient notes none.    Dyspepsia History:      She has no alarm features of dyspepsia including no history of melena, hematochezia, dysphagia, persistent vomiting, or involuntary weight loss > 5%.  There is a prior history of GERD.  The patient does not have a prior history of documented ulcer disease.  The dominant symptom is heartburn or acid reflux.  An H-2 blocker medication is currently being taken.  She notes that the symptoms have improved with the H-2 blocker therapy.  Symptoms have not persisted after 4 weeks of H-2 blocker treatment.     Preventive Screening-Counseling & Management  Alcohol-Tobacco     Alcohol drinks/day: 0     Alcohol Counseling: not indicated; patient does not drink     Smoking Status: never     Passive Smoke  Exposure: no     Tobacco Counseling: not indicated; no tobacco use  Hep-HIV-STD-Contraception     Hepatitis Risk: no risk noted     HIV Risk: no     STD Risk: no risk noted     Dental Visit-last 6 months yes     Dental Care Counseling: to seek dental care; no dental care within six months     SBE monthly: yes     SBE Education/Counseling: to perform regular SBE     Sun Exposure-Excessive: no      Sexual History:  not active.        Drug Use:  never.        Blood Transfusions:  no.    Clinical Review Panels:  Prevention   Last Mammogram:  ASSESSMENT: Negative - BI-RADS 1^MM DIGITAL SCREENING (01/19/2010)   Last Colonoscopy:  Normal (11/09/2005)  Immunizations   Last Tetanus Booster:  given (01/26/2002)   Last Zoster Vaccine:  Zostavax (08/24/2010)  Lipid Management   Cholesterol:  186 (06/02/2009)   LDL (bad choesterol):  122 (06/02/2009)   HDL (good cholesterol):  30.90 (06/02/2009)  Diabetes Management   Creatinine:  0.9 (11/01/2010)  CBC   WBC:  4.4 (12/08/2010)   RBC:  3.77 (12/08/2010)  Hgb:  12.2 (12/08/2010)   Hct:  34.7 (12/08/2010)   Platelets:  305.0 (12/08/2010)   MCV  92.1 (12/08/2010)   MCHC  35.2 (12/08/2010)   RDW  13.5 (12/08/2010)   PMN:  69.6 (12/08/2010)   Lymphs:  19.3 (12/08/2010)   Monos:  8.2 (12/08/2010)   Eosinophils:  2.5 (12/08/2010)   Basophil:  0.4 (12/08/2010)  Complete Metabolic Panel   Glucose:  104 (11/01/2010)   Sodium:  140 (11/01/2010)   Potassium:  4.8 (11/01/2010)   Chloride:  106 (11/01/2010)   CO2:  29 (11/01/2010)   BUN:  19 (11/01/2010)   Creatinine:  0.9 (11/01/2010)   Albumin:  3.6 (11/01/2010)   Total Protein:  6.2 (11/01/2010)   Calcium:  9.4 (11/01/2010)   Total Bili:  0.4 (11/01/2010)   Alk Phos:  62 (11/01/2010)   SGPT (ALT):  12 (11/01/2010)   SGOT (AST):  16 (11/01/2010)   Medications Prior to Update: 1)  Lorazepam 2 Mg Tabs (Lorazepam) .... Take 1 Tab By Mouth Two Times A Day As Needed For  Anxiety 2)  Ambien 10 Mg Tabs (Zolpidem Tartrate) .... At Bedtime 3)  Macrodantin 100 Mg Caps (Nitrofurantoin Macrocrystal) .... As Needed 4)  Oxybutynin Chloride 10 Mg Xr24h-Tab (Oxybutynin Chloride) 5)  Hydrocodone-Acetaminophen 5-500 Mg Tabs (Hydrocodone-Acetaminophen) .... One By Mouth Three Times A Day As Needed Hip Pain 6)  Benefiber 7)  Promethazine Hcl 12.5 Mg Tabs (Promethazine Hcl) .Marland Kitchen.. 1 Every 6 Hours As Needed 8)  Trazodone Hcl 50 Mg Tabs (Trazodone Hcl) .Marland Kitchen.. 1-2 By Mouth At Bedtime For Insomnia 9)  Mirtazapine 15 Mg Tabs (Mirtazapine) .... One By Mouth At Bedtime For Depression 10)  Phendimetrazine Tartrate 105 Mg Xr24h-Cap (Phendimetrazine Tartrate) .... One By Mouth Once Daily As Needed 11)  Colestipol Hcl 1 Gm Tabs (Colestipol Hcl) .... 2 By Mouth Two Times A Day 12)  Dexilant 60 Mg Cpdr (Dexlansoprazole) .... One By Mouth Once Daily As Needed For Heartburn  Current Medications (verified): 1)  Lorazepam 2 Mg Tabs (Lorazepam) .... Take 1 Tab By Mouth Two Times A Day As Needed For Anxiety 2)  Ambien 10 Mg Tabs (Zolpidem Tartrate) .... At Bedtime 3)  Macrodantin 100 Mg Caps (Nitrofurantoin Macrocrystal) .... As Needed 4)  Oxybutynin Chloride 10 Mg Xr24h-Tab (Oxybutynin Chloride) 5)  Hydrocodone-Acetaminophen 5-500 Mg Tabs (Hydrocodone-Acetaminophen) .... One By Mouth Three Times A Day As Needed Hip Pain 6)  Benefiber 7)  Promethazine Hcl 12.5 Mg Tabs (Promethazine Hcl) .Marland Kitchen.. 1 Every 6 Hours As Needed 8)  Trazodone Hcl 50 Mg Tabs (Trazodone Hcl) .Marland Kitchen.. 1-2 By Mouth At Bedtime For Insomnia 9)  Mirtazapine 15 Mg Tabs (Mirtazapine) .... One By Mouth At Bedtime For Depression 10)  Phendimetrazine Tartrate 105 Mg Xr24h-Cap (Phendimetrazine Tartrate) .... One By Mouth Once Daily As Needed 11)  Colestipol Hcl 1 Gm Tabs (Colestipol Hcl) .... 2 By Mouth Two Times A Day 12)  Dexilant 60 Mg Cpdr (Dexlansoprazole) .... One By Mouth Once Daily As Needed For Heartburn  Allergies (verified): No  Known Drug Allergies  Past History:  Past Medical History: Last updated: 06/02/2009 DEPRESSIVE DISORDER (ICD-311) DIARRHEA (ICD-787.91) FATIGUE (ICD-780.79) DIARRHEA (ICD-787.91) ABDOMINAL DISTENSION (ICD-787.3) HEMORRHOIDS, INTERNAL (ICD-455.0) CONSTIPATION, CHRONIC (ICD-564.09) CARCINOMA, COLON, FAMILY HX (ICD-V16.0) PROLAPSE, RECTAL (ICD-569.1) ULCER, RECTUM (ICD-569.41) IBS (ICD-564.1) Osteoarthritis Low back pain  Past Surgical History: Last updated: 09/09/2008 Cholecystectomy Hysterectomy Appendectomy -1998 S/P RIGHT HEMICOLECTOMY,CECECTOMY  Family History: Last updated: 08/20/2008 Family History of Colon Cancer: Sister Family History of Diabetes: Sister  Social  History: Last updated: 11/06/2008 Occupation: Retired Illicit Drug Use - no Never Smoked Regular exercise-no Alcohol use-no  Risk Factors: Alcohol Use: 0 (02/03/2011) Exercise: yes (08/24/2010)  Risk Factors: Smoking Status: never (02/03/2011) Passive Smoke Exposure: no (02/03/2011)  Family History: Reviewed history from 08/20/2008 and no changes required. Family History of Colon Cancer: Sister Family History of Diabetes: Sister  Social History: Reviewed history from 11/06/2008 and no changes required. 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  The patient denies anorexia, fever, weight loss, chest pain, syncope, dyspnea on exertion, peripheral edema, prolonged cough, headaches, hemoptysis, abdominal pain, melena, hematochezia, severe indigestion/heartburn, hematuria, suspicious skin lesions, difficulty walking, depression, and angioedema.   GI:  Denies abdominal pain, bloody stools, constipation, dark tarry stools, diarrhea, indigestion, loss of appetite, nausea, vomiting, vomiting blood, and yellowish skin color. Psych:  Denies anxiety, depression, easily angered, easily  tearful, irritability, mental problems, panic attacks, sense of great danger, suicidal thoughts/plans, thoughts of violence, unusual visions or sounds, and thoughts /plans of harming others.  Physical Exam  General:  alert, well-developed, well-nourished, well-hydrated, appropriate dress, normal appearance, healthy-appearing, and cooperative to examination.   Head:  normocephalic, atraumatic, no abnormalities observed, and no abnormalities palpated.   Eyes:  vision grossly intact, pupils equal, pupils round, and pupils reactive to light.   Mouth:  Oral mucosa and oropharynx without lesions or exudates.  Teeth in good repair. Neck:  supple, full ROM, no masses, no thyromegaly, no thyroid nodules or tenderness, no JVD, normal carotid upstroke, no carotid bruits, no cervical lymphadenopathy, and no neck tenderness.   Lungs:  normal respiratory effort, no intercostal retractions, no accessory muscle use, normal breath sounds, no dullness, no fremitus, no crackles, and no wheezes.   Heart:  normal rate, regular rhythm, no murmur, no gallop, and no rub.   Abdomen:  soft, non-tender, normal bowel sounds, no distention, no masses, no guarding, no rigidity, no rebound tenderness, no abdominal hernia, no inguinal hernia, no hepatomegaly, and no splenomegaly.   Msk:  left great toe looks good with no erythema, warmth, ttp, exudate, foreign body, and the nail is intact. there is good sensation and capillary refill distally. Pulses:  R and L carotid,radial,femoral,dorsalis pedis and posterior tibial pulses are full and equal bilaterally Extremities:  No clubbing, cyanosis, edema, or deformity noted with normal full range of motion of all joints.   Neurologic:  alert & oriented X3, cranial nerves II-XII intact, strength normal in all extremities, sensation intact to light touch, sensation intact to pinprick, gait normal, DTRs symmetrical and normal, finger-to-nose normal, heel-to-shin normal, toes down bilaterally  on Babinski, and Romberg negative.   Skin:  turgor normal, color normal, no rashes, no suspicious lesions, no ecchymoses, no petechiae, no purpura, no ulcerations, and no edema.   Cervical Nodes:  no anterior cervical adenopathy and no posterior cervical adenopathy.   Psych:  Cognition and judgment appear intact. Alert and cooperative with normal attention span and concentration. No apparent delusions, illusions, hallucinations   Impression & Recommendations:  Problem # 1:  ANEMIA ASSOCIATED W/OTHER SPEC NUTRITIONAL DEFIC (ICD-281.8) Assessment Unchanged  Problem # 2:  ESSENTIAL HYPERTENSION (ICD-401.9) Assessment: Improved  BP today: 120/80 Prior BP: 130/80 (12/08/2010)  Prior 10 Yr Risk Heart Disease: 20 % (12/08/2010)  Labs Reviewed: K+: 4.8 (11/01/2010) Creat: : 0.9 (11/01/2010)   Chol: 186 (06/02/2009)   HDL: 30.90 (06/02/2009)  LDL: 122 (06/02/2009)   TG: 167.0 (06/02/2009)  Problem # 3:  GERD (ICD-530.81) Assessment: Improved  Her updated medication list for this problem includes:    Dexilant 60 Mg Cpdr (Dexlansoprazole) ..... One by mouth once daily as needed for heartburn  Labs Reviewed: Hgb: 12.2 (12/08/2010)   Hct: 34.7 (12/08/2010)  Problem # 4:  OBESITY (ICD-278.00) Assessment: Unchanged  Ht: 65.5 (02/03/2011)   Wt: 133 (02/03/2011)   BMI: 21.87 (02/03/2011)  Problem # 5:  MAJOR DPRSV DISORDER RECURRENT EPISODE MODERATE (ICD-296.32) Assessment: Improved  Complete Medication List: 1)  Lorazepam 2 Mg Tabs (Lorazepam) .... Take 1 tab by mouth two times a day as needed for anxiety 2)  Ambien 10 Mg Tabs (Zolpidem tartrate) .... At bedtime 3)  Macrodantin 100 Mg Caps (Nitrofurantoin macrocrystal) .... As needed 4)  Oxybutynin Chloride 10 Mg Xr24h-tab (Oxybutynin chloride) 5)  Hydrocodone-acetaminophen 5-500 Mg Tabs (Hydrocodone-acetaminophen) .... One by mouth three times a day as needed hip pain 6)  Benefiber  7)  Promethazine Hcl 12.5 Mg Tabs (Promethazine  hcl) .Marland Kitchen.. 1 every 6 hours as needed 8)  Trazodone Hcl 50 Mg Tabs (Trazodone hcl) .Marland Kitchen.. 1-2 by mouth at bedtime for insomnia 9)  Mirtazapine 15 Mg Tabs (Mirtazapine) .... One by mouth at bedtime for depression 10)  Phendimetrazine Tartrate 105 Mg Xr24h-cap (Phendimetrazine tartrate) .... One by mouth once daily as needed 11)  Colestipol Hcl 1 Gm Tabs (Colestipol hcl) .... 2 by mouth two times a day 12)  Dexilant 60 Mg Cpdr (Dexlansoprazole) .... One by mouth once daily as needed for heartburn  Patient Instructions: 1)  Please schedule a follow-up appointment in 2 months. 2)  Avoid foods high in acid (tomatoes, citrus juices, spicy foods). Avoid eating within two hours of lying down or before exercising. Do not over eat; try smaller more frequent meals. Elevate head of bed twelve inches when sleeping. 3)  It is important that you exercise regularly at least 20 minutes 5 times a week. If you develop chest pain, have severe difficulty breathing, or feel very tired , stop exercising immediately and seek medical attention. 4)  You need to lose weight. Consider a lower calorie diet and regular exercise.  5)  Check your Blood Pressure regularly. If it is above 130/80: you should make an appointment. Prescriptions: DEXILANT 60 MG CPDR (DEXLANSOPRAZOLE) One by mouth once daily as needed for heartburn  #30 x 11   Entered and Authorized by:   [name] MD   Signed by:   [name] MD on 02/03/2011   Method used:   Electronically to        [facility] [phone]* (retail)       [address] [place], [place]  [address]       Ph: [phone]       Fax: [phone]   RxID:   [id number] PHENDIMETRAZINE TARTRATE 105 MG XR24H-CAP (PHENDIMETRAZINE TARTRATE) One by mouth once daily as needed  #30 x 2   Entered and Authorized by:   [name] MD   Signed by:   [name] MD on 02/03/2011   Method used:   Print then Give to Patient   RxID:   [id number]    Orders  Added: 1)  Est. Patient Level IV [95638]

## 2011-02-25 ENCOUNTER — Encounter: Payer: Self-pay | Admitting: Internal Medicine

## 2011-03-08 NOTE — Letter (Signed)
Summary: [facility]  [facility]   Imported By: Sherian Rein 03/04/2011 14:39:20  _____________________________________________________________________  External Attachment:    Type:   Image     Comment:   External Document

## 2011-03-14 ENCOUNTER — Ambulatory Visit (INDEPENDENT_AMBULATORY_CARE_PROVIDER_SITE_OTHER): Payer: MEDICARE | Admitting: Internal Medicine

## 2011-03-14 ENCOUNTER — Encounter: Payer: Self-pay | Admitting: Internal Medicine

## 2011-03-14 DIAGNOSIS — F418 Other specified anxiety disorders: Secondary | ICD-10-CM | POA: Insufficient documentation

## 2011-03-14 DIAGNOSIS — F3289 Other specified depressive episodes: Secondary | ICD-10-CM

## 2011-03-14 DIAGNOSIS — F329 Major depressive disorder, single episode, unspecified: Secondary | ICD-10-CM

## 2011-03-14 DIAGNOSIS — D538 Other specified nutritional anemias: Secondary | ICD-10-CM

## 2011-03-14 DIAGNOSIS — I10 Essential (primary) hypertension: Secondary | ICD-10-CM

## 2011-03-24 NOTE — Assessment & Plan Note (Signed)
Summary: FOLLOW UP BEFORE EYE SURGERY/ HAS A COLD /NWS #   Vital Signs:  Patient profile:   77 year old female Menstrual status:  postmenopausal Height:      65.5 inches (166.37 cm) Weight:      134.25 pounds (61.02 kg) BMI:     22.08 O2 Sat:      95 % on Room air Temp:     98.2 degrees F (36.78 degrees C) oral Pulse rate:   91 / minute Pulse rhythm:   regular Resp:     12 per minute BP sitting:   120 / 80  (left arm) Cuff size:   regular  Vitals Entered By: Burnard Leigh CMA(AAMA) (March 14, 2011 1:43 PM)  O2 Flow:  Room air CC: F/U before cataract Sx on Thursday.Pt c/o depression/sls, cma, Depressive symptoms Is Patient Diabetic? No Pain Assessment Patient in pain? no        Primary Care Provider:  Sanda Linger, MD  CC:  F/U before cataract Sx on Thursday.Pt c/o depression/sls, cma, and Depressive symptoms.  History of Present Illness:  Depressive Symptoms      This is a 77 year old woman who presents with Depressive symptoms.  The symptoms began 4-8 weeks ago.  The severity is described as mild.  The patient reports depressed mood, loss of interest/pleasure, and insomnia, but denies significant weight loss, significant weight gain, hypersomnia, psychomotor agitation, and psychomotor retardation.  The patient also reports fatigue or loss of energy, feelings of worthlessness, and diminished concentration.  The patient denies indecisiveness, thoughts of death, thoughts of suicide, suicidal intent, and suicidal plans.  The patient reports the following psychosocial stressors: recent traumatic event.  Patient's past history includes depression.  The patient denies abnormally elevated mood, abnormally irritable mood, decreased need for sleep, increased talkativeness, distractibility, flight of ideas, increased goal-directed activity, and inflated self-esteem/ grandiosity.    Preventive Screening-Counseling & Management  Alcohol-Tobacco     Alcohol drinks/day: 0     Alcohol  Counseling: not indicated; patient does not drink     Smoking Status: never     Passive Smoke Exposure: no     Tobacco Counseling: not indicated; no tobacco use  Hep-HIV-STD-Contraception     Hepatitis Risk: no risk noted     HIV Risk: no     STD Risk: no risk noted     Dental Visit-last 6 months yes     Dental Care Counseling: to seek dental care; no dental care within six months     SBE monthly: yes     SBE Education/Counseling: to perform regular SBE     Sun Exposure-Excessive: no      Sexual History:  not active.        Drug Use:  never.        Blood Transfusions:  no.    Clinical Review Panels:  Prevention   Last Mammogram:  ASSESSMENT: Negative - BI-RADS 1^MM DIGITAL SCREENING (01/19/2010)   Last Colonoscopy:  Normal (11/09/2005)  Immunizations   Last Tetanus Booster:  given (01/26/2002)   Last Zoster Vaccine:  Zostavax (08/24/2010)  Lipid Management   Cholesterol:  186 (06/02/2009)   LDL (bad choesterol):  122 (06/02/2009)   HDL (good cholesterol):  30.90 (06/02/2009)  Diabetes Management   Creatinine:  0.9 (11/01/2010)  CBC   WBC:  4.4 (12/08/2010)   RBC:  3.77 (12/08/2010)   Hgb:  12.2 (12/08/2010)   Hct:  34.7 (12/08/2010)   Platelets:  305.0 (12/08/2010)  MCV  92.1 (12/08/2010)   MCHC  35.2 (12/08/2010)   RDW  13.5 (12/08/2010)   PMN:  69.6 (12/08/2010)   Lymphs:  19.3 (12/08/2010)   Monos:  8.2 (12/08/2010)   Eosinophils:  2.5 (12/08/2010)   Basophil:  0.4 (12/08/2010)  Complete Metabolic Panel   Glucose:  104 (11/01/2010)   Sodium:  140 (11/01/2010)   Potassium:  4.8 (11/01/2010)   Chloride:  106 (11/01/2010)   CO2:  29 (11/01/2010)   BUN:  19 (11/01/2010)   Creatinine:  0.9 (11/01/2010)   Albumin:  3.6 (11/01/2010)   Total Protein:  6.2 (11/01/2010)   Calcium:  9.4 (11/01/2010)   Total Bili:  0.4 (11/01/2010)   Alk Phos:  62 (11/01/2010)   SGPT (ALT):  12 (11/01/2010)   SGOT (AST):  16 (11/01/2010)   Medications Prior to  Update: 1)  Lorazepam 2 Mg Tabs (Lorazepam) .... Take 1 Tab By Mouth Two Times A Day As Needed For Anxiety 2)  Ambien 10 Mg Tabs (Zolpidem Tartrate) .... At Bedtime 3)  Macrodantin 100 Mg Caps (Nitrofurantoin Macrocrystal) .... As Needed 4)  Oxybutynin Chloride 10 Mg Xr24h-Tab (Oxybutynin Chloride) 5)  Hydrocodone-Acetaminophen 5-500 Mg Tabs (Hydrocodone-Acetaminophen) .... One By Mouth Three Times A Day As Needed Hip Pain 6)  Benefiber 7)  Promethazine Hcl 12.5 Mg Tabs (Promethazine Hcl) .Marland Kitchen.. 1 Every 6 Hours As Needed 8)  Trazodone Hcl 50 Mg Tabs (Trazodone Hcl) .Marland Kitchen.. 1-2 By Mouth At Bedtime For Insomnia 9)  Mirtazapine 15 Mg Tabs (Mirtazapine) .... One By Mouth At Bedtime For Depression 10)  Phendimetrazine Tartrate 105 Mg Xr24h-Cap (Phendimetrazine Tartrate) .... One By Mouth Once Daily As Needed 11)  Colestipol Hcl 1 Gm Tabs (Colestipol Hcl) .... 2 By Mouth Two Times A Day 12)  Dexilant 60 Mg Cpdr (Dexlansoprazole) .... One By Mouth Once Daily As Needed For Heartburn  Current Medications (verified): 1)  Ambien 10 Mg Tabs (Zolpidem Tartrate) .... At Bedtime 2)  Macrodantin 100 Mg Caps (Nitrofurantoin Macrocrystal) .... As Needed 3)  Oxybutynin Chloride 10 Mg Xr24h-Tab (Oxybutynin Chloride) 4)  Hydrocodone-Acetaminophen 5-500 Mg Tabs (Hydrocodone-Acetaminophen) .... One By Mouth Three Times A Day As Needed Hip Pain 5)  Benefiber 6)  Promethazine Hcl 12.5 Mg Tabs (Promethazine Hcl) .Marland Kitchen.. 1 Every 6 Hours As Needed 7)  Trazodone Hcl 50 Mg Tabs (Trazodone Hcl) .Marland Kitchen.. 1-2 By Mouth At Bedtime For Insomnia 8)  Mirtazapine 15 Mg Tabs (Mirtazapine) .... One By Mouth At Bedtime For Depression 9)  Phendimetrazine Tartrate 105 Mg Xr24h-Cap (Phendimetrazine Tartrate) .... One By Mouth Once Daily As Needed 10)  Colestipol Hcl 1 Gm Tabs (Colestipol Hcl) .... 2 By Mouth Two Times A Day 11)  Dexilant 60 Mg Cpdr (Dexlansoprazole) .... One By Mouth Once Daily As Needed For Heartburn 12)  Cymbalta 30 Mg Cpep  (Duloxetine Hcl) .... One By Mouth Once Daily For Depression 13)  Xanax 2 Mg Tabs (Alprazolam) .... One By Mouth Three Times A Day As Needed For Anxiety  Allergies (verified): No Known Drug Allergies  Past History:  Past Medical History: Last updated: 06/02/2009 DEPRESSIVE DISORDER (ICD-311) DIARRHEA (ICD-787.91) FATIGUE (ICD-780.79) DIARRHEA (ICD-787.91) ABDOMINAL DISTENSION (ICD-787.3) HEMORRHOIDS, INTERNAL (ICD-455.0) CONSTIPATION, CHRONIC (ICD-564.09) CARCINOMA, COLON, FAMILY HX (ICD-V16.0) PROLAPSE, RECTAL (ICD-569.1) ULCER, RECTUM (ICD-569.41) IBS (ICD-564.1) Osteoarthritis Low back pain  Past Surgical History: Last updated: 09/09/2008 Cholecystectomy Hysterectomy Appendectomy -1998 S/P RIGHT HEMICOLECTOMY,CECECTOMY  Family History: Last updated: 08/20/2008 Family History of Colon Cancer: Sister Family History of Diabetes: Sister  Social History: Last updated: 11/06/2008  Occupation: Retired Producer, 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 Family History of Colon Cancer: Sister Family History of Diabetes: Sister  Social History: Reviewed history from 11/06/2008 and no changes required. 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  The patient denies anorexia, fever, weight loss, weight gain, chest pain, syncope, dyspnea on exertion, peripheral edema, prolonged cough, headaches, hemoptysis, abdominal pain, melena, hematochezia, severe indigestion/heartburn, hematuria, muscle weakness, suspicious skin lesions, transient blindness, difficulty walking, unusual weight change, abnormal bleeding,  enlarged lymph nodes, and angioedema.   Psych:  Complains of anxiety, depression, easily angered, easily tearful, and irritability; denies alternate hallucination ( auditory/visual), mental problems, panic attacks, sense of great danger, suicidal thoughts/plans, thoughts of violence, unusual visions or sounds, and thoughts /plans of harming others.  Physical Exam  General:  alert, well-developed, well-nourished, well-hydrated, appropriate dress, normal appearance, healthy-appearing, and cooperative to examination.   Mouth:  Oral mucosa and oropharynx without lesions or exudates.  Teeth in good repair. Neck:  supple, full ROM, no masses, no thyromegaly, no thyroid nodules or tenderness, no JVD, normal carotid upstroke, no carotid bruits, no cervical lymphadenopathy, and no neck tenderness.   Lungs:  normal respiratory effort, no intercostal retractions, no accessory muscle use, normal breath sounds, no dullness, no fremitus, no crackles, and no wheezes.   Heart:  normal rate, regular rhythm, no murmur, no gallop, and no rub.   Abdomen:  soft, non-tender, normal bowel sounds, no distention, no masses, no guarding, no rigidity, no rebound tenderness, no abdominal hernia, no inguinal hernia, no hepatomegaly, and no splenomegaly.   Msk:  left great toe looks good with no erythema, warmth, ttp, exudate, foreign body, and the nail is intact. there is good sensation and capillary refill distally. Pulses:  R and L carotid,radial,femoral,dorsalis pedis and posterior tibial pulses are full and equal bilaterally Extremities:  No clubbing, cyanosis, edema, or deformity noted with normal full range of motion of all joints.   Skin:  turgor normal, color normal, no rashes, no suspicious lesions, no ecchymoses, no petechiae, no purpura, no ulcerations, and no edema.   Cervical Nodes:  no anterior cervical adenopathy and no posterior cervical adenopathy.   Psych:  Cognition and judgment appear intact. Alert and  cooperative with normal attention span and concentration. No apparent delusions, illusions, hallucinations   Impression & Recommendations:  Problem # 1:  DEPRESSIVE DISORDER (ICD-311) Assessment New  The following medications were removed from the medication list:    Lorazepam 2 Mg Tabs (Lorazepam) .Marland Kitchen... Take 1 tab by mouth two times a day as needed for anxiety    Mirtazapine 15 Mg Tabs (Mirtazapine) ..... One by mouth at bedtime for depression Her updated medication list for this problem includes:    Trazodone Hcl 50 Mg Tabs (Trazodone hcl) .Marland Kitchen... 1-2 by mouth at bedtime for insomnia    Cymbalta 30 Mg Cpep (Duloxetine hcl) ..... One by mouth once daily for depression    Xanax 2 Mg Tabs (Alprazolam) ..... One by mouth three times a day as needed for anxiety  Discussed treatment options, including trial of antidpressant medication. Will refer to behavioral health. Follow-up call in in 24-48 hours and recheck in 2  weeks, sooner as needed. Patient agrees to call if any worsening of symptoms or thoughts of doing harm arise. Verified that the patient has no suicidal ideation at this time.   Problem # 2:  ANEMIA ASSOCIATED W/OTHER SPEC NUTRITIONAL DEFIC (ICD-281.8) Assessment: Improved  Hgb: 12.2 (12/08/2010)   Hct: 34.7 (12/08/2010)   Platelets: 305.0 (12/08/2010) RBC: 3.77 (12/08/2010)   RDW: 13.5 (12/08/2010)   WBC: 4.4 (12/08/2010) MCV: 92.1 (12/08/2010)   MCHC: 35.2 (12/08/2010) Iron: 65 (12/08/2010)   % Sat: 19.6 (12/08/2010) B12: 244 (12/08/2010)   Folate: 11.7 (12/08/2010)   TSH: 2.15 (11/01/2010)  Problem # 3:  ESSENTIAL HYPERTENSION (ICD-401.9) Assessment: Improved  BP today: 120/80 Prior BP: 120/80 (02/03/2011)  Prior 10 Yr Risk Heart Disease: 20 % (12/08/2010)  Labs Reviewed: K+: 4.8 (11/01/2010) Creat: : 0.9 (11/01/2010)   Chol: 186 (06/02/2009)   HDL: 30.90 (06/02/2009)   LDL: 122 (06/02/2009)   TG: 167.0 (06/02/2009)  Complete Medication List: 1)  Ambien 10 Mg Tabs  (Zolpidem tartrate) .... At bedtime 2)  Macrodantin 100 Mg Caps (Nitrofurantoin macrocrystal) .... As needed 3)  Oxybutynin Chloride 10 Mg Xr24h-tab (Oxybutynin chloride) 4)  Hydrocodone-acetaminophen 5-500 Mg Tabs (Hydrocodone-acetaminophen) .... One by mouth three times a day as needed hip pain 5)  Benefiber  6)  Promethazine Hcl 12.5 Mg Tabs (Promethazine hcl) .Marland Kitchen.. 1 every 6 hours as needed 7)  Trazodone Hcl 50 Mg Tabs (Trazodone hcl) .Marland Kitchen.. 1-2 by mouth at bedtime for insomnia 8)  Phendimetrazine Tartrate 105 Mg Xr24h-cap (Phendimetrazine tartrate) .... One by mouth once daily as needed 9)  Colestipol Hcl 1 Gm Tabs (Colestipol hcl) .... 2 by mouth two times a day 10)  Dexilant 60 Mg Cpdr (Dexlansoprazole) .... One by mouth once daily as needed for heartburn 11)  Cymbalta 30 Mg Cpep (Duloxetine hcl) .... One by mouth once daily for depression 12)  Xanax 2 Mg Tabs (Alprazolam) .... One by mouth three times a day as needed for anxiety  Patient Instructions: 1)  Please schedule a follow-up appointment in 2 months. 2)  It is important that you exercise regularly at least 20 minutes 5 times a week. If you develop chest pain, have severe difficulty breathing, or feel very tired , stop exercising immediately and seek medical attention. 3)  You need to lose weight. Consider a lower calorie diet and regular exercise.  Prescriptions: XANAX 2 MG TABS (ALPRAZOLAM) One by mouth three times a day as needed for anxiety  #75 x 5   Entered and Authorized by:   [name] MD   Signed by:   [name] MD on 03/14/2011   Method used:   Print then Give to Patient   RxID:   [id number] CYMBALTA 30 MG CPEP (DULOXETINE HCL) One by mouth once daily for depression  #30 x 11   Entered and Authorized by:   [name] MD   Signed by:   [name] MD on 03/14/2011   Method used:   Print then Give to Patient   RxID:   [id number]    Orders Added: 1)  Est. Patient Level IV [84696]

## 2011-03-31 ENCOUNTER — Other Ambulatory Visit: Payer: Self-pay | Admitting: Internal Medicine

## 2011-03-31 NOTE — Telephone Encounter (Signed)
Please advise, Ok to call in rf?

## 2011-05-10 NOTE — Assessment & Plan Note (Signed)
[facility]                         GASTROENTEROLOGY OFFICE NOTE   NAME:Sherri Weber, Sherri Weber                    MRN:          [id number]  DATE:06/25/2007                            DOB:          January 03, 1933    Sherri Weber is a 77 year old white female with history of irritable  bowel syndrome.  We saw her in the past for rectal ulcer due to rectal  prolapse.  She has a family history of colon cancer in her sister, and  underwent last colonoscopy in September of 2006 with finding of a rectal  ulcer.  She subsequently moved to [place] to be with her daughter where she  underwent posterior colporrhaphy in May 2007 to repair her rectocele.  She has been quite satisfied with the results.  Currently, her bowel  habits are regular.  She does take MiraLax occasionally as well as  Benefiber on a daily basis.  She just wanted to check with [place] today.   MEDICATIONS:  1. Multiple vitamins.  2. Lorazepam 1 mg half tablet nightly.  3. Ambien 10 mg h.s.  4. Cymbalta 60 mg p.o. daily.  5. Benefiber daily.   PHYSICAL EXAMINATION:  Blood pressure 100/80, pulse 80 and weight 137  pounds.  She was alert and oriented in no distress.  LUNGS:  Clear to auscultation.  COR:  Normal S1 and normal S2.  ABDOMEN:  Soft, somewhat distended and tympanitic, but nontender with  normoactive bowel sounds.  RECTAL EXAM:  With normal rectal tone.  Stool was hemoccult negative.   IMPRESSION:  A 77 year old white female with irritable bowel syndrome,  status post rectal fissure repair, currently doing well.   PLAN:  1. Booklet on gas with list of foods that may cause flatulence.  2. We prescribed MiraLax 17 gm to take on p.r.n. basis.  3. Continue Benefiber.  4. Samples of probiotic Align given to improve her bacterial flora.  I      will see her on a p.r.n. basis.     Hedwig Morton. Juanda Chance, MD  Electronically Signed    DMB/MedQ  DD: 06/25/2007  DT: 06/25/2007  Job #: [id number]   cc:    Larina Earthly, M.D.

## 2011-05-13 NOTE — Op Note (Signed)
Sherri Weber. [facility]  Patient:    Sherri Weber, Sherri Weber [facility]                  MRN: [id number] Proc. Date: 05/15/00 Adm. Date:  [id number] Disc. Date: [id number] Attending:  Liborio Nixon CC:         [facility] E. Arleta Creek, M.D.                           Operative Report  PREOPERATIVE DIAGNOSIS:  Stress urinary incontinence, neurogenic bladder.  POSTOPERATIVE DIAGNOSIS:  Stress urinary incontinence, neurogenic bladder.  OPERATION:  Pubovaginal sling with cadaveric fascia lata.  SURGEON:  Bertram Millard. Dahlstedt, M.D.  ANESTHESIA:  General endotracheal anesthesia.  COMPLICATIONS:  None.  ESTIMATED BLOOD LOSS:  350 cc.  INDICATION:  A 77 year old female with recurrent stress urinary incontinence. I first saw her in 1995.  At that time, she had a history of retention.  She also had a history of NMK in 1975 and a birch procedure in 1993.  I performed a pubovaginal sling on her in 1997.  Thus, she has had recurrent incontinence despite that.  She has a patent but patulous bladder neck. Because of recurrent stress incontinence and her desire to be dry, it was recommended that she undergo repeat pubovaginal sling to place her in total retention.  She does use intermittent catheterization to empty her bladder appropriately at least twice a day.  She is aware that she may have to be totally relied on an intermittent catheterization to drain her bladder.  She is also aware of the risks and complications and desires to proceed.  DESCRIPTION OF PROCEDURE:  The patient was administered a general endotracheal anesthesia, placed in the dorsal lithotomy position.  The genitalia, perineum, as well as the lower abdomen were prepped and draped.  A weighted posterior vaginal speculum was placed.  A Foley catheter was placed in the urethra and the bladder drained.  The vaginal submucosa was infiltrated with 1% lidocaine with  epinephrine.  An incision was made from the bladder neck to just proximal to the urethral meatus and this incision was dissected laterally bilaterally.  There were free flaps on each side.  The urethra was identified.  I could not specifically identify the pubovaginal sling which had previously been placed.  The pubocervical fascia was punctured bilaterally with some difficulty.  This allowed access to the space of Retzius which was somewhat scarred.  It was dissected bilaterally, bluntly by placing a finger through the pubocervical fascial incisions.  The bladder was dissected posteriorly allowing my finger to advance to the posterior rectus muscle.  This was done bilaterally.  At this point, a 9 cm by 2 cm strip of cadaveric fascia lata was soaked in antibiotic solution.  Then 0 Prolene sutures were then placed in the helical fashion at both ends of this strip.  The Razz needle was then brought through the space of Retzius by passing it through a previously constructed suprapubic incision.  The needle was placed through the space of Retzius kind of guiding it through the space with finger.  By doing this bilaterally, the free ends of the suture were grasped thus pulling the sling up so that is cradled the proximal urethra at the bladder neck.  The midaspect of the strip was then sutured to the bladder neck and the urethra  with 4-0 Vicryl.  A significant amount of tension was placed on this pubovaginal sling, creating in effect a state of retention.  Inspection of the bladder and urethra revealed an excellent closure of the bladder neck.  The bladder was found to be without trauma.  The sutures were then tied to its free end bilaterally and then over the midline to the contralateral free ends. Again, I made sure I placed a significant amount of tension on the sutures. Inspection of the bladder neck revealed it to be fishmouth position.  At this point, the vaginal incision was left  open for an anterior repair by Dr. Guy Sandifer. Tomblin II.  The skin edges of the suprapubic incision were closed using staples.  A Foley catheter was left to dependent drainage.  The patient tolerated the procedure well.  Estimated blood loss was 350 cc. Sponge, needle, and instrument counts were correct x 2.  The procedure with Dr. Guy Sandifer. Tomblin II was then commenced.DD:  05/15/00 TD:  05/20/00 Job: [id number]

## 2011-05-13 NOTE — Discharge Summary (Signed)
[facility]  Patient:    Sherri Weber, Sherri Weber [facility]                  MRN: [id number] Adm. Date:  [id number] Disc. Date: [id number] Attending:  Liborio Nixon CC:         Guy Sandifer. Arleta Creek, M.D.                           Discharge Summary  DIAGNOSES: 1. Vaginal prolapse. 2. Stress urinary incontinence. 3. Neurogenic bladder. 4. Enterocele. 5. History of depression.  PRINCIPAL PROCEDURE:  May 15, 2000, anterior and posterior repair, pubovaginal sling.  BRIEF HISTORY:  This middle-aged female has recurrent stress urinary incontinence.  She has a neurogenic bladder.  She also has vaginal prolapse. Dr. Henderson Cloud plans an anterior and posterior repair on her and I will be performing a pubovaginal sling.  She is aware of risks and complications of this procedure and has decided to proceed.  HOSPITAL COURSE:  The patient was admitted directly to the operating room where she underwent the previously mentioned procedures.  She had an uncomplicated postoperative stay with the exception of a low-grade temperature of 100.5.  Suprapubic incision looked to be healing well on the second postoperative day.  She was tolerating a regular diet.  Vaginal pack had been removed.  She was discharged from the hospital on postoperative day #2.  At that time she was discharged on a regular diet.  Activity restrictions include no heavy lifting greater than 20 pounds for six weeks.  She was told not to drive for two weeks.  Dr. Retta Diones will call the patient to setup an appointment.  DISCHARGE MEDICATIONS:  She was discharged on: 1. Hydrocodone one or two q.4h. as needed for pain. 2. Keflex 500 mg one q.12h. for five days.  She knows to call for any questions or problems. DD:  07/12/00 TD:  07/13/00 Job: [id number]

## 2011-05-13 NOTE — H&P (Signed)
[facility]  Patient:    Sherri Weber, Sherri Weber                      MRN: [id number] Adm. Date:  05/15/00 Attending:  Guy Sandifer. Arleta Creek, M.D.                         History and Physical  CHIEF COMPLAINT:  Rectocele and stress incontinence.  HISTORY OF PRESENT ILLNESS:  This patient is a 77 year old divorced white female, G1, P1, status post vaginal hysterectomy at age 58.  She subsequently underwent exploratory laparotomy with BSO, posterior vaginal repair and Burch colposuspension in 1993.  Since that time she has had recurrence of her rectocele and now a possible enterocele as well.  This is becoming quite uncomfortable for her.  She has also had recurrent stress urinary incontinence and has been evaluated by Dr. Retta Diones.  She is being admitted for surgical correction.  PAST MEDICAL HISTORY: 1. Depression. 2. Detrusor instability. 3. History of back pain.  PAST SURGICAL HISTORY: 1. Vaginal hysterectomy at 77 years old. 2. Probable urethral stent in 1975. 3. Spinal surgery in 1973. 4. Exploratory laparotomy with BSO, Burch colposuspension and posterior    vaginal repair in 1993. 5. Cystoscopy with urethral dilation in 1993. 6. Appendectomy, 1998.  MEDICATIONS: 1. Zoloft 100 mg daily. 2. Ditropan XL daily 3. Estratest H.S. daily.  ALLERGIES:  No known drug allergies.  SOCIAL HISTORY:  The patient denies tobacco, alcohol or drug abuse.  FAMILY HISTORY:  Positive for chronic hypertension in sister, chronic hypertension and CVA in father.  OBSTETRICAL HISTORY:  Vaginal delivery x 1, 8 pounds 3-1/2 ounces.  REVIEW OF SYSTEMS:  Negative except as above.  PHYSICAL EXAMINATION:  GENERAL:  Height 5 feet 6-1/2 inches.  Weight 140 pounds.  VITAL SIGNS:  Blood pressure 100/60.  HEENT:  Without thyromegaly.  LUNGS:  Clear to auscultation.  HEART:  Regular rate and rhythm.  BACK:  Without CVA tenderness.  BREASTS:  Without mass,  retraction, discharge.  ABDOMEN:  Soft, nontender without masses.  PELVIC EXAM:  Vulva, vaginal and cervix without lesion.  A rectocele and possible enterocele is down essentially to the vaginal introitus.  No vaginal lesions are noted.  Adnexa nontender without masses.  RECTAL EXAM:  Without masses.  EXTREMITIES AND NEUROLOGICAL EXAM:  Grossly within normal limits.  ASSESSMENT: 1. Recurrent rectocele and enterocele. 2. Stress urinary incontinence.  PLAN:  Rectocele and enterocele repair, possible sacrospinous ligament suspension and pubovaginal sling per Dr. Retta Diones. DD:  05/09/00 TD:  05/09/00 Job: [id number]

## 2011-05-13 NOTE — Op Note (Signed)
[facility]  Patient:    Sherri Weber, Sherri Weber [facility]                  MRN: [id number] Proc. Date: 05/15/00 Adm. Date:  [id number] Disc. Date: [id number] Attending:  Liborio Nixon CC:         Bertram Millard. Dahlstedt, M.D.             Raynald Kemp, M.D.                           Operative Report  PREOPERATIVE DIAGNOSIS:  Enterocele and rectocele.  POSTOPERATIVE DIAGNOSIS:  Cystocele and rectocele.  OPERATION PERFORMED:  Anterior-posterior vaginal repair.  SURGEON:  Guy Sandifer. Arleta Creek, M.D.  ASSISTANT:  Raynald Kemp, M.D.  ANESTHESIA:  General with endotracheal intubation.  ESTIMATED BLOOD LOSS:  350 cc (total).  INDICATIONS FOR PROCEDURE:  The patient is a 77 year old divorced white female G1, P1 with recurrent rectocele and possible enterocele on preoperative exam. Details are dictated in the history and physical.  Posterior vaginal repair with possible sacral spinous ligament suspension was discussed with the patient.  Possible risks and complications have been discussed including but not limited to infection, bowel, bladder or ureteral damage, bleeding requireing transfusion of blood products with possible transfusion reaction, HIV and hepatitis acquisition, fistula formation, deep vein thrombosis, pulmonary embolus, pneumonia and recurrence of pelvic prolapse.  All questions were answered and consent was signed on the chart.  DESCRIPTION OF PROCEDURE:  The patient was taken to the operating room and placed in dorsal supine position where general anesthesia was induced via endotracheal intubation.  She was then placed in the dorsal lithotomy position where she was prepped.  Dr. Retta Diones then did his pubovaginal sling procedure.  At that point, careful inspection revealed a first and second degree cystocele as well as a high rectocele.  The vagina was grasped at the fornices bilaterally with Allis clamps and an inverted T-shaped incision  was made in the mucosa with a scalpel.  The anterior vaginal mucosa was then released in the midline from the underlying bladder with sharp and blunt dissection, carried up to meet the transverse incision which was left open following the pubovaginal sling.  The mucosa was then widely dissected bilaterally sharply and bluntly.  A pursestring suture of 0 Monocryl was used to reduce the cystocele.  The vesicovaginal fascia was then reapproximated in the midline with interrupted 0 Monocryl.  Excess mucosa was trimmed and anterior mucosa was closed in a running locking fashion with 2-0 Vicryl.  The midline mucosal incision and the transverse incisions were closed separately. Good hemostasis was obtained.  A diamond-shaped wedge of tissue was then removed with scalpel over the posterior peritoneal body.  The posterior vaginal mucosa was then dissected from the underlying rectum with sharp and blunt dissection in the midline.  This was then carried widely bilaterally sharply and bluntly.  An Allis clamp in the vaginal fornix was then used to identify the uterosacral ligament bilaterally.  The uterosacral ligaments were then grasped with Allis clamps bilaterally and were then ligated in the midline with two sutures of 0 Monocryl.  The rectovaginal fascia was then reapproximated in the midline with interrupted 0 Monocryl.  Excess mucosa was trimmed and the posterior mucosa was closed with running locking 2-0 Vicryl suture down to the point of the vaginal introitus.  The posterior perineal body was then dissected bilaterally and a posterior perineorrhaphy was  carried out with 2 Monocryl suture.  The 2-0 Vicryl suture was then extended on down to close the perineum in an episiotomy type fashion.  Good hemostasis was again noted.  The patient already had an indwelling Foley catheter status post pubovaginal sling.  A 1-inch iodoform gauze was then placed in the vagina. All counts were correct.  The  patient was awakened and taken to the recovery room in stable condition. DD:  05/15/00 TD:  05/18/00 Job: [id number]

## 2011-05-13 NOTE — H&P (Signed)
[facility]  Patient:    Sherri Weber                     MRN: [id number] Proc. Date: 05/09/00 Attending:  Rande Brunt. Clarke-Pearson, M.D. CC:         Telford Nab, R.N.             Leatha Gilding. Mezer, M.D.                         History and Physical  HISTORY:  This 77 year old white female presents today on referral of Dr. Teodora Medici for evaluation of ovarian cysts.  The patient initially presented several months ago with right lower quadrant discomfort and pain and on evaluation was found to have bilateral ovarian cysts.  In February 2001 the right cyst, which was simple, measured 3.2 x 2.0 cm, and there were three small cysts on the left ovary.  CA-125 at that time was 5.9 units/ml.  She had a follow-up ultrasound obtained in March and again on April 12 which show essentially stable cysts.  The patient continues to have pain.  She has seen Dr. Leonette Most Pippitt (GYN oncologist) in [facility] for second opinion. Otherwise the patient has no GI or GU symptoms.  She denies any other pelvic pain, pressure, vaginal bleeding, or discharge.  Her last menstrual period was 14 months ago.  She is not having any hot flashes.  She is taking Revival soy product and has not taken any hormone replacement therapy.  PAST GYNECOLOGIC HISTORY:  The patient reports she has always had normal Pap smears.  At one point took Estrace and Cycrin but had significant symptoms from the use of the progestin.  PAST MEDICAL HISTORY:  MEDICAL ILLNESSES:  None.  DRUG ALLERGIES:  None.  PRIOR HOSPITALIZATIONS:  None except for shoulder surgery.  CURRENT MEDICATIONS:  Revival soy product.  FAMILY HISTORY:  Negative for gynecologic or breast cancers.  PHYSICAL EXAMINATION:  GENERAL:  Well-developed white female in no acute distress.  Weight 139 pounds.  Height 5 feet 5 inches.  VITAL SIGNS:  Blood pressure 110/70.  ABDOMEN:  Soft and nontender.  No mass,  organomegaly, ascites, or hernias are noted.  PELVIC:  Endometriosis normal.  Vagina clean.  Cervix is parous, clean, and no lesions noted.  Uterus is anterior, normal shape, size, and consistency. There is some fullness in the right adnexa but I do not feel a discrete mass. Left adnexa feels normal.  Rectovaginal exam confirms.  IMPRESSION:  Bilateral simple ovarian cyst which I believe represent benign neoplasm.  Nontheless, because of the patients symptoms, I believe she would be best served by surgical resection.  We had a lengthy consultation regarding the pros and cons of having a hysterectomy.  Given the fact that the patient does not tolerate progestins, I think it would be best to have a hysterectomy so that she would be able to avoid the use of progestin should she ultimately take hormone replacement therapy.  We discussed the pros and cons of approaching this laparoscopically, vaginally, or through an abdominal incision.  If it were to be done abdominally, I would feel comfortable in having it performed through a Pfannenstiel incision given the low probability that this is a malignancy.  We also discussed the possibility of leaving the cervix behind.  Given the fact that she has had no cervical pathology, a supracervical hysterectomy would be reasonable if that were the  patients wishes.  All of her questions are answered.  She returned to the care of Dr. Chevis Pretty.  I will plan on being on standby for her case should malignancy be discovered. DD:  05/09/00 TD:  05/09/00 Job: [id number]

## 2011-05-31 ENCOUNTER — Encounter: Payer: Self-pay | Admitting: Internal Medicine

## 2011-06-01 ENCOUNTER — Encounter: Payer: Self-pay | Admitting: Internal Medicine

## 2011-06-01 ENCOUNTER — Other Ambulatory Visit: Payer: Self-pay | Admitting: Internal Medicine

## 2011-06-01 ENCOUNTER — Ambulatory Visit (INDEPENDENT_AMBULATORY_CARE_PROVIDER_SITE_OTHER): Payer: Medicare Other | Admitting: Internal Medicine

## 2011-06-01 ENCOUNTER — Other Ambulatory Visit (INDEPENDENT_AMBULATORY_CARE_PROVIDER_SITE_OTHER): Payer: Medicare Other

## 2011-06-01 VITALS — BP 138/78 | HR 66 | Temp 98.7°F | Resp 16 | Wt 131.0 lb

## 2011-06-01 DIAGNOSIS — I10 Essential (primary) hypertension: Secondary | ICD-10-CM

## 2011-06-01 DIAGNOSIS — E785 Hyperlipidemia, unspecified: Secondary | ICD-10-CM

## 2011-06-01 DIAGNOSIS — D538 Other specified nutritional anemias: Secondary | ICD-10-CM

## 2011-06-01 DIAGNOSIS — N390 Urinary tract infection, site not specified: Secondary | ICD-10-CM

## 2011-06-01 DIAGNOSIS — K644 Residual hemorrhoidal skin tags: Secondary | ICD-10-CM | POA: Insufficient documentation

## 2011-06-01 DIAGNOSIS — M545 Low back pain, unspecified: Secondary | ICD-10-CM

## 2011-06-01 DIAGNOSIS — K5909 Other constipation: Secondary | ICD-10-CM

## 2011-06-01 DIAGNOSIS — F329 Major depressive disorder, single episode, unspecified: Secondary | ICD-10-CM

## 2011-06-01 DIAGNOSIS — G4700 Insomnia, unspecified: Secondary | ICD-10-CM

## 2011-06-01 LAB — CBC WITH DIFFERENTIAL/PLATELET
Basophils Absolute: 0 10*3/uL (ref 0.0–0.1)
Basophils Relative: 0.6 % (ref 0.0–3.0)
Eosinophils Absolute: 0.2 10*3/uL (ref 0.0–0.7)
Eosinophils Relative: 2.4 % (ref 0.0–5.0)
HCT: 39.4 % (ref 36.0–46.0)
Hemoglobin: 13.3 g/dL (ref 12.0–15.0)
Lymphocytes Relative: 14.8 % (ref 12.0–46.0)
Lymphs Abs: 1 10*3/uL (ref 0.7–4.0)
MCHC: 33.7 g/dL (ref 30.0–36.0)
MCV: 93.9 fl (ref 78.0–100.0)
Monocytes Absolute: 0.4 10*3/uL (ref 0.1–1.0)
Monocytes Relative: 5.6 % (ref 3.0–12.0)
Neutro Abs: 5.3 10*3/uL (ref 1.4–7.7)
Neutrophils Relative %: 76.6 % (ref 43.0–77.0)
Platelets: 318 10*3/uL (ref 150.0–400.0)
RBC: 4.2 Mil/uL (ref 3.87–5.11)
RDW: 13.3 % (ref 11.5–14.6)
WBC: 6.9 10*3/uL (ref 4.5–10.5)

## 2011-06-01 LAB — COMPREHENSIVE METABOLIC PANEL
ALT: 14 U/L (ref 0–35)
AST: 18 U/L (ref 0–37)
Albumin: 4.3 g/dL (ref 3.5–5.2)
Alkaline Phosphatase: 64 U/L (ref 39–117)
BUN: 13 mg/dL (ref 6–23)
CO2: 30 mEq/L (ref 19–32)
Calcium: 9.2 mg/dL (ref 8.4–10.5)
Chloride: 102 mEq/L (ref 96–112)
Creatinine, Ser: 1 mg/dL (ref 0.4–1.2)
GFR: 60.78 mL/min (ref >60.00)
Glucose, Bld: 89 mg/dL (ref 70–99)
Potassium: 4.6 mEq/L (ref 3.5–5.1)
Sodium: 139 mEq/L (ref 135–145)
Total Bilirubin: 0.3 mg/dL (ref 0.3–1.2)
Total Protein: 7.3 g/dL (ref 6.0–8.3)

## 2011-06-01 LAB — LIPID PANEL
Cholesterol: 191 mg/dL (ref 0–200)
HDL: 32 mg/dL — ABNORMAL LOW (ref >39.00)
Total CHOL/HDL Ratio: 6
Triglycerides: 281 mg/dL — ABNORMAL HIGH (ref 0.0–149.0)
VLDL: 56.2 mg/dL — ABNORMAL HIGH (ref 0.0–40.0)

## 2011-06-01 LAB — LDL CHOLESTEROL, DIRECT: Direct LDL: 117.5 mg/dL

## 2011-06-01 MED ORDER — ZOLPIDEM TARTRATE 10 MG PO TABS: 10.0000 mg | ORAL_TABLET | Freq: Every evening | ORAL | Status: DC | PRN

## 2011-06-01 MED ORDER — NITROFURANTOIN MACROCRYSTAL 100 MG PO CAPS: 100.0000 mg | ORAL_CAPSULE | Freq: Every day | ORAL | Status: DC

## 2011-06-01 MED ORDER — HYDROCORTISONE ACETATE 25 MG RE SUPP
25.0000 mg | Freq: Two times a day (BID) | RECTAL | Status: AC
Start: 2011-06-01 — End: 2011-06-11

## 2011-06-01 NOTE — Assessment & Plan Note (Signed)
Recheck her CBC today 

## 2011-06-01 NOTE — Assessment & Plan Note (Signed)
Check her UA and culture today and continue macrodantin

## 2011-06-01 NOTE — Assessment & Plan Note (Signed)
Her BP is well controlled 

## 2011-06-01 NOTE — Assessment & Plan Note (Signed)
She is doing well will continue current meds

## 2011-06-01 NOTE — Assessment & Plan Note (Signed)
She is doing very well on her current meds so will continue those for now

## 2011-06-01 NOTE — Assessment & Plan Note (Signed)
Referral to Dr. Kinnie Scales to see if he can do a procedure to treat the hemorrhoids

## 2011-06-01 NOTE — Assessment & Plan Note (Signed)
Continue ambien prn

## 2011-06-01 NOTE — Patient Instructions (Signed)
Hemorrhoids Hemorrhoids are dilated (enlarged) veins around the rectum. Sometimes clots will form in the veins. This makes them swollen and painful. These are called thrombosed hemorrhoids. Causes of hemorrhoids include:  Pregnancy: this increases the pressure in the hemorrhoidal veins.   Constipation.   Straining to have a bowel movement.  HOME CARE INSTRUCTIONS  Eat a well balanced diet and drink 6 to 8 glasses of water every day to avoid constipation. You may also use a bulk laxative.   Avoid straining to have bowel movements.   Keep anal area dry and clean.   Only take over-the-counter or prescription medicines for pain, discomfort, or fever as directed by your caregiver.  If thrombosed:  Take hot sitz baths for 20 to 30 minutes, 3 to 4 times per day.   If the hemorrhoids are very tender and swollen, place ice packs on area as tolerated. Using ice packs between sitz baths may be helpful. Fill a plastic bag with ice and use a towel between the bag of ice and your skin.   Special creams and suppositories (Anusol, Nupercainal, Wyanoids) may be used or applied as directed.   Do not use a donut shaped pillow or sit on the toilet for long periods. This increases blood pooling and pain.   Move your bowels when your body has the urge; this will require less straining and will decrease pain and pressure.   Only take over-the-counter or prescription medicines for pain, discomfort, or fever as directed by your caregiver.  SEEK MEDICAL CARE IF:  You have increasing pain and swelling that is not controlled with your prescription.   You have uncontrolled bleeding.   You have an inability or difficulty having a bowel movement.   You have pain or inflammation outside the area of the hemorrhoids.   You have chills and/or an oral temperature above 100.5 that lasts for 2 days or longer, or as your caregiver suggests.  MAKE SURE YOU:   Understand these instructions.   Will watch your  condition.   Will get help right away if you are not doing well or get worse.  Document Released: 12/09/2000 Document Re-Released: 11/24/2008 [facility] Patient Information 2011 [place], [place].

## 2011-06-01 NOTE — Assessment & Plan Note (Signed)
I will check her FLP today 

## 2011-06-01 NOTE — Progress Notes (Signed)
Subjective:    Patient ID: Sherri Weber, female    DOB: 19-May-1933, 77 y.o.   MRN: [id number]  HPI She returns c/o hemorrhoidal problems for the last month. It started with some constipation. She went to the ER 2 weeks ago and has had some relief with suppositories. She also complains of dry mouth.    Review of Systems  Constitutional: Negative for fever, chills, diaphoresis, activity change, appetite change, fatigue and unexpected weight change.  HENT: Negative for sore throat, facial swelling, trouble swallowing, neck pain, neck stiffness and voice change.   Eyes: Negative for photophobia and visual disturbance.  Respiratory: Negative for apnea, cough, chest tightness, shortness of breath, wheezing and stridor.   Cardiovascular: Negative for chest pain, palpitations and leg swelling.  Gastrointestinal: Positive for constipation, blood in stool and rectal pain. Negative for nausea, vomiting, abdominal pain, diarrhea and abdominal distention.  Genitourinary: Negative for dysuria, urgency, frequency, hematuria, flank pain, decreased urine volume, vaginal bleeding, vaginal discharge, enuresis, difficulty urinating, genital sores, vaginal pain, menstrual problem, pelvic pain and dyspareunia.  Musculoskeletal: Negative for myalgias, back pain, joint swelling, arthralgias and gait problem.  Skin: Negative for color change, pallor and rash.  Psychiatric/Behavioral: Positive for sleep disturbance (insomnia, DFA/FA/EMA). Negative for suicidal ideas, hallucinations, behavioral problems, confusion, self-injury, dysphoric mood, decreased concentration and agitation. The patient is not nervous/anxious and is not hyperactive.        Objective:   Physical Exam  Constitutional: She is oriented to person, place, and time. She appears well-developed and well-nourished. No distress.  HENT:  Head: Normocephalic and atraumatic.  Right Ear: External ear normal.  Left Ear: External ear normal.  Nose:  Nose normal.  Mouth/Throat: No oropharyngeal exudate.  Eyes: Conjunctivae and EOM are normal. Pupils are equal, round, and reactive to light. Right eye exhibits no discharge. Left eye exhibits no discharge. No scleral icterus.  Neck: Normal range of motion. Neck supple. No JVD present. No tracheal deviation present. No thyromegaly present.  Cardiovascular: Normal rate, regular rhythm, normal heart sounds and intact distal pulses.  Exam reveals no gallop and no friction rub.   No murmur heard. Pulmonary/Chest: Effort normal and breath sounds normal. No stridor. No respiratory distress. She has no wheezes. She has no rales. She exhibits no tenderness.  Abdominal: Soft. Bowel sounds are normal. She exhibits no distension and no mass. There is no tenderness. There is no rebound and no guarding.  Genitourinary: Rectal exam shows external hemorrhoid and anal tone abnormal. Rectal exam shows no internal hemorrhoid, no fissure, no mass and no tenderness. Guaiac positive stool. Right adnexum displays no mass, no tenderness and no fullness. Left adnexum displays no mass, no tenderness and no fullness.  Musculoskeletal: Normal range of motion. She exhibits no edema and no tenderness.  Lymphadenopathy:    She has no cervical adenopathy.  Neurological: She is alert and oriented to person, place, and time. She has normal reflexes. She displays normal reflexes. No cranial nerve deficit. She exhibits normal muscle tone. Coordination normal.  Skin: Skin is warm and dry. No rash noted. She is not diaphoretic. No erythema. No pallor.  Psychiatric: She has a normal mood and affect. Her behavior is normal. Judgment and thought content normal.        Lab Results  Component Value Date   WBC 4.4* 12/08/2010   HGB 12.2 12/08/2010   HCT 34.7* 12/08/2010   PLT 305.0 12/08/2010   CHOL 186 06/02/2009   TRIG 167.0* 06/02/2009   HDL 30.90*  06/02/2009   ALT 12 11/01/2010   AST 16 11/01/2010   NA 140 11/01/2010   K 4.8  11/01/2010   CL 106 11/01/2010   CREATININE 0.9 11/01/2010   BUN 19 11/01/2010   CO2 29 11/01/2010   TSH 2.15 11/01/2010    Assessment & Plan:

## 2011-06-02 ENCOUNTER — Telehealth: Payer: Self-pay | Admitting: *Deleted

## 2011-06-02 DIAGNOSIS — K644 Residual hemorrhoidal skin tags: Secondary | ICD-10-CM

## 2011-06-02 NOTE — Telephone Encounter (Signed)
Try general surgery

## 2011-06-02 NOTE — Telephone Encounter (Signed)
Pt could not get into see Dr Kinnie Scales until July 3 rd. She says she is too uncomfortable and needs tx sooner. Would you refer to another GI MD?

## 2011-06-02 NOTE — Telephone Encounter (Signed)
Patient notified that new referral put in and that someone will contact her

## 2011-06-13 ENCOUNTER — Telehealth: Payer: Self-pay | Admitting: *Deleted

## 2011-06-13 NOTE — Telephone Encounter (Signed)
Pt states her hemorrhoids have gotten worse. She is becoming more weak and would like to know if she can get a sooner appt.

## 2011-06-17 ENCOUNTER — Encounter (INDEPENDENT_AMBULATORY_CARE_PROVIDER_SITE_OTHER): Payer: Self-pay | Admitting: Surgery

## 2011-06-28 ENCOUNTER — Telehealth: Payer: Self-pay | Admitting: *Deleted

## 2011-06-28 NOTE — Telephone Encounter (Signed)
Spoke w/daughter - Pt was seen by surgeon who told her she had a "tear" and hemorrhoids. She is seeing Dr Kinnie Scales Thursday the 5th. I advised daughter to keep apt on the 5th and f/u w/Dr Yetta Barre after this issue was settled w/Dr Medoff.

## 2011-06-28 NOTE — Telephone Encounter (Signed)
Pt's daughter is req a call back.

## 2011-07-04 ENCOUNTER — Other Ambulatory Visit (INDEPENDENT_AMBULATORY_CARE_PROVIDER_SITE_OTHER): Payer: Medicare Other

## 2011-07-04 ENCOUNTER — Encounter: Payer: Self-pay | Admitting: Internal Medicine

## 2011-07-04 ENCOUNTER — Ambulatory Visit (INDEPENDENT_AMBULATORY_CARE_PROVIDER_SITE_OTHER): Payer: Medicare Other | Admitting: Internal Medicine

## 2011-07-04 VITALS — BP 144/72 | HR 84 | Temp 98.6°F | Resp 16 | Ht 65.5 in | Wt 129.5 lb

## 2011-07-04 DIAGNOSIS — K920 Hematemesis: Secondary | ICD-10-CM

## 2011-07-04 DIAGNOSIS — D538 Other specified nutritional anemias: Secondary | ICD-10-CM

## 2011-07-04 DIAGNOSIS — K219 Gastro-esophageal reflux disease without esophagitis: Secondary | ICD-10-CM

## 2011-07-04 LAB — CBC WITH DIFFERENTIAL/PLATELET
Basophils Absolute: 0 10*3/uL (ref 0.0–0.1)
Basophils Relative: 0.4 % (ref 0.0–3.0)
Eosinophils Absolute: 0.1 10*3/uL (ref 0.0–0.7)
Eosinophils Relative: 2 % (ref 0.0–5.0)
HCT: 36.3 % (ref 36.0–46.0)
Hemoglobin: 12.7 g/dL (ref 12.0–15.0)
Lymphocytes Relative: 11.1 % — ABNORMAL LOW (ref 12.0–46.0)
Lymphs Abs: 0.7 10*3/uL (ref 0.7–4.0)
MCHC: 35.1 g/dL (ref 30.0–36.0)
MCV: 92.1 fl (ref 78.0–100.0)
Monocytes Absolute: 0.3 10*3/uL (ref 0.1–1.0)
Monocytes Relative: 5.2 % (ref 3.0–12.0)
Neutro Abs: 5.1 10*3/uL (ref 1.4–7.7)
Neutrophils Relative %: 81.3 % — ABNORMAL HIGH (ref 43.0–77.0)
Platelets: 343 10*3/uL (ref 150.0–400.0)
RBC: 3.94 Mil/uL (ref 3.87–5.11)
RDW: 13.2 % (ref 11.5–14.6)
WBC: 6.2 10*3/uL (ref 4.5–10.5)

## 2011-07-04 MED ORDER — DEXLANSOPRAZOLE 60 MG PO CPDR: 60.0000 mg | DELAYED_RELEASE_CAPSULE | Freq: Every day | ORAL | Status: DC

## 2011-07-04 MED ORDER — PROMETHAZINE HCL 12.5 MG PO TABS: 12.5000 mg | ORAL_TABLET | Freq: Four times a day (QID) | ORAL | Status: DC | PRN

## 2011-07-04 NOTE — Assessment & Plan Note (Signed)
Check CBC today.  

## 2011-07-04 NOTE — Assessment & Plan Note (Signed)
Restart dexilant. 

## 2011-07-04 NOTE — Patient Instructions (Signed)
Esophagitis (Heartburn) Esophagitis (heartburn) is a painful, burning sensation in the chest. It may feel worse in certain positions, such as lying down or bending over. It is caused by stomach acid backing up into the tube that carries food from the mouth down to the stomach (lower esophagus). TREATMENT There are a number of non-prescription medicines used to treat heartburn, including:  Antacids.   Acid reducers (also called H-2 blockers).   Proton-pump inhibitors.  HOME CARE INSTRUCTIONS  Raise the head of your bead by putting blocks under the legs.   Eat 2-3 hours before going to bed.   Stop smoking.   Try to reach and maintain a healthy weight.   Do not eat just a few very large meals. Instead, eat many smaller meals throughout the day.   Try to identify foods and beverages that make your symptoms worse, and avoid these.   Avoid tight clothing.   Do not exercise right after eating.  SEEK IMMEDIATE MEDICAL CARE IF YOU:  Have severe chest pain that goes down your arm, or into your jaw or neck.   Feel sweaty, dizzy, or lightheaded.   Are short of breath.   Throw up (vomit) blood.   Have difficulty or pain with swallowing.   Have bloody or black, tarry stools.   Have bouts of heartburn more than three times a week for more than two weeks.  Document Released: 01/19/2005 Document Re-Released: 03/08/2010 [employer] Patient Information 2011 [employer]. 

## 2011-07-04 NOTE — Progress Notes (Signed)
Subjective:    Patient ID: Sherri Weber, female    DOB: 02-09-1933, 77 y.o.   MRN: [id number]  Heartburn She complains of early satiety (she feels like she has lost weight over the last month), heartburn (she has not been taking dexilant recently) and nausea (she thinks she may have vomited small pieces of coffee grounds recently). She reports no abdominal pain, no belching, no chest pain, no choking, no coughing, no dysphagia, no globus sensation, no hoarse voice, no sore throat, no stridor, no tooth decay, no water brash or no wheezing. This is a recurrent problem. The current episode started more than 1 year ago. The problem occurs occasionally. The problem has been gradually worsening. The heartburn duration is an hour. The heartburn is located in the substernum. The heartburn is of moderate intensity. The heartburn does not wake her from sleep. The heartburn does not limit her activity. The heartburn doesn't change with position. Pertinent negatives include no anemia, fatigue, melena, muscle weakness, orthopnea or weight loss.      Review of Systems  Constitutional: Positive for unexpected weight change. Negative for fever, chills, weight loss, diaphoresis, activity change, appetite change and fatigue.  HENT: Negative for sore throat, hoarse voice, facial swelling, trouble swallowing, neck pain, neck stiffness and voice change.   Eyes: Negative for photophobia and visual disturbance.  Respiratory: Negative for apnea, cough, choking, chest tightness, shortness of breath, wheezing and stridor.   Cardiovascular: Negative for chest pain, palpitations and leg swelling.  Gastrointestinal: Positive for heartburn (she has not been taking dexilant recently) and nausea (she thinks she may have vomited small pieces of coffee grounds recently). Negative for dysphagia, vomiting, abdominal pain, diarrhea, constipation, blood in stool, melena, abdominal distention, anal bleeding and rectal pain.    Genitourinary: Negative for dysuria, urgency, hematuria, flank pain, decreased urine volume, enuresis, difficulty urinating and dyspareunia.  Musculoskeletal: Negative for myalgias, back pain, joint swelling, arthralgias, gait problem and muscle weakness.  Skin: Negative for color change, pallor, rash and wound.  Neurological: Negative for dizziness, tremors, seizures, syncope, facial asymmetry, speech difficulty, weakness, light-headedness, numbness and headaches.  Hematological: Negative for adenopathy. Does not bruise/bleed easily.  Psychiatric/Behavioral: Negative for suicidal ideas, hallucinations, behavioral problems, confusion, sleep disturbance, self-injury, dysphoric mood, decreased concentration and agitation. The patient is not nervous/anxious and is not hyperactive.        Objective:   Physical Exam  Vitals reviewed. Constitutional: She is oriented to person, place, and time. She appears well-developed and well-nourished. No distress.  HENT:  Head: Normocephalic and atraumatic.  Right Ear: External ear normal.  Left Ear: External ear normal.  Nose: Nose normal.  Mouth/Throat: Oropharynx is clear and moist. No oropharyngeal exudate.  Eyes: Conjunctivae and EOM are normal. Pupils are equal, round, and reactive to light. Right eye exhibits no discharge. Left eye exhibits no discharge. No scleral icterus.  Neck: Normal range of motion. Neck supple. No JVD present. No tracheal deviation present. No thyromegaly present.  Cardiovascular: Normal rate, regular rhythm, normal heart sounds and intact distal pulses.  Exam reveals no gallop and no friction rub.   No murmur heard. Pulmonary/Chest: Effort normal and breath sounds normal. No stridor. No respiratory distress. She has no wheezes. She has no rales. She exhibits no tenderness.  Abdominal: Soft. Bowel sounds are normal. She exhibits no distension and no mass. There is no tenderness. There is no rebound and no guarding.   Musculoskeletal: Normal range of motion. She exhibits no edema and no tenderness.  Lymphadenopathy:    She has no cervical adenopathy.  Neurological: She is alert and oriented to person, place, and time. She displays normal reflexes. No cranial nerve deficit. She exhibits normal muscle tone. Coordination normal.  Skin: Skin is warm and dry. No rash noted. She is not diaphoretic. No erythema. No pallor.  Psychiatric: She has a normal mood and affect. Her behavior is normal. Judgment and thought content normal.          Assessment & Plan:

## 2011-07-04 NOTE — Assessment & Plan Note (Signed)
Restart dexilant, check a CBC, she will see her GI (Dr. Kinnie Scales) next week and she will ask him if she needs an upper endoscopy

## 2011-07-07 ENCOUNTER — Encounter (INDEPENDENT_AMBULATORY_CARE_PROVIDER_SITE_OTHER): Payer: Medicare Other | Admitting: Surgery

## 2011-07-18 ENCOUNTER — Ambulatory Visit (INDEPENDENT_AMBULATORY_CARE_PROVIDER_SITE_OTHER): Payer: Medicare Other | Admitting: Internal Medicine

## 2011-07-18 ENCOUNTER — Encounter (INDEPENDENT_AMBULATORY_CARE_PROVIDER_SITE_OTHER): Payer: Self-pay | Admitting: Surgery

## 2011-07-18 ENCOUNTER — Encounter: Payer: Self-pay | Admitting: Internal Medicine

## 2011-07-18 DIAGNOSIS — N318 Other neuromuscular dysfunction of bladder: Secondary | ICD-10-CM

## 2011-07-18 DIAGNOSIS — K920 Hematemesis: Secondary | ICD-10-CM

## 2011-07-18 DIAGNOSIS — M545 Low back pain, unspecified: Secondary | ICD-10-CM

## 2011-07-18 DIAGNOSIS — K219 Gastro-esophageal reflux disease without esophagitis: Secondary | ICD-10-CM

## 2011-07-18 DIAGNOSIS — E669 Obesity, unspecified: Secondary | ICD-10-CM

## 2011-07-18 DIAGNOSIS — N3281 Overactive bladder: Secondary | ICD-10-CM | POA: Insufficient documentation

## 2011-07-18 MED ORDER — DEXLANSOPRAZOLE 60 MG PO CPDR: 60.0000 mg | DELAYED_RELEASE_CAPSULE | Freq: Every day | ORAL | Status: DC

## 2011-07-18 MED ORDER — CYCLOBENZAPRINE HCL 10 MG PO TABS: 10.0000 mg | ORAL_TABLET | Freq: Three times a day (TID) | ORAL | Status: DC | PRN

## 2011-07-18 MED ORDER — PHENDIMETRAZINE TARTRATE ER 105 MG PO CP24: 1.0000 | ORAL_CAPSULE | Freq: Every day | ORAL | Status: DC

## 2011-07-18 MED ORDER — OXYBUTYNIN CHLORIDE ER 10 MG PO TB24: 10.0000 mg | ORAL_TABLET | Freq: Every day | ORAL | Status: DC

## 2011-07-18 NOTE — Assessment & Plan Note (Signed)
This has improved with PPI therapy so she will continue taking it

## 2011-07-18 NOTE — Patient Instructions (Signed)
Esophagitis (Heartburn) Esophagitis (heartburn) is a painful, burning sensation in the chest. It may feel worse in certain positions, such as lying down or bending over. It is caused by stomach acid backing up into the tube that carries food from the mouth down to the stomach (lower esophagus). TREATMENT There are a number of non-prescription medicines used to treat heartburn, including:  Antacids.   Acid reducers (also called H-2 blockers).   Proton-pump inhibitors.  HOME CARE INSTRUCTIONS  Raise the head of your bead by putting blocks under the legs.   Eat 2-3 hours before going to bed.   Stop smoking.   Try to reach and maintain a healthy weight.   Do not eat just a few very large meals. Instead, eat many smaller meals throughout the day.   Try to identify foods and beverages that make your symptoms worse, and avoid these.   Avoid tight clothing.   Do not exercise right after eating.  SEEK IMMEDIATE MEDICAL CARE IF YOU:  Have severe chest pain that goes down your arm, or into your jaw or neck.   Feel sweaty, dizzy, or lightheaded.   Are short of breath.   Throw up (vomit) blood.   Have difficulty or pain with swallowing.   Have bloody or black, tarry stools.   Have bouts of heartburn more than three times a week for more than two weeks.  Document Released: 01/19/2005 Document Re-Released: 03/08/2010 [employer] Patient Information 2011 [employer]. 

## 2011-07-18 NOTE — Assessment & Plan Note (Signed)
She was recently seen at an [facility] and was given flexeril and she has received a lot of relief with that and she wants to continue taking it

## 2011-07-18 NOTE — Progress Notes (Signed)
Subjective:    Patient ID: Sherri Weber, female    DOB: 1933/12/24, 77 y.o.   MRN: [id number]  Back Pain This is a chronic problem. The current episode started more than 1 year ago. The problem occurs intermittently. The problem has been gradually improving since onset. The pain is present in the lumbar spine. The quality of the pain is described as aching. The pain does not radiate. The pain is at a severity of 3/10. The pain is moderate. The pain is the same all the time. The symptoms are aggravated by bending. Stiffness is present all day. Pertinent negatives include no abdominal pain, bladder incontinence, bowel incontinence, chest pain, dysuria, fever, headaches, leg pain, numbness, paresis, paresthesias, pelvic pain, perianal numbness, tingling, weakness or weight loss. Risk factors include lack of exercise. She has tried analgesics and muscle relaxant for the symptoms. The treatment provided significant relief.  Gastrophageal Reflux She complains of nausea. She reports no abdominal pain, no chest pain, no choking, no coughing, no sore throat or no wheezing. This is a recurrent problem. The current episode started more than 1 year ago. The problem occurs occasionally. The problem has been gradually improving. The symptoms are aggravated by nothing. Pertinent negatives include no anemia, fatigue, melena, muscle weakness, orthopnea or weight loss. She has tried a PPI for the symptoms. The treatment provided significant relief.      Review of Systems  Constitutional: Negative for fever, chills, weight loss, diaphoresis, activity change, appetite change, fatigue and unexpected weight change.  HENT: Negative for sore throat, facial swelling, trouble swallowing, neck pain, neck stiffness and voice change.   Eyes: Negative for photophobia, pain, discharge, redness, itching and visual disturbance.  Respiratory: Negative for apnea, cough, choking, chest tightness, shortness of breath, wheezing and  stridor.   Cardiovascular: Negative for chest pain, palpitations and leg swelling.  Gastrointestinal: Positive for nausea. Negative for vomiting, abdominal pain, diarrhea, constipation, blood in stool, melena, abdominal distention, anal bleeding, rectal pain and bowel incontinence.  Genitourinary: Positive for urgency and frequency. Negative for bladder incontinence, dysuria, hematuria, flank pain, decreased urine volume, enuresis, difficulty urinating, genital sores, menstrual problem, pelvic pain and dyspareunia.  Musculoskeletal: Positive for back pain. Negative for myalgias, joint swelling, arthralgias, gait problem and muscle weakness.  Skin: Negative for color change, pallor, rash and wound.  Neurological: Negative for dizziness, tingling, tremors, seizures, syncope, facial asymmetry, speech difficulty, weakness, light-headedness, numbness, headaches and paresthesias.  Hematological: Negative for adenopathy. Does not bruise/bleed easily.  Psychiatric/Behavioral: Negative.        Objective:   Physical Exam  Vitals reviewed. Constitutional: She is oriented to person, place, and time. She appears well-developed and well-nourished. No distress.  HENT:  Head: Normocephalic and atraumatic.  Right Ear: External ear normal.  Left Ear: External ear normal.  Nose: Nose normal.  Mouth/Throat: Oropharynx is clear and moist. No oropharyngeal exudate.  Eyes: Conjunctivae and EOM are normal. Pupils are equal, round, and reactive to light. Right eye exhibits no discharge. Left eye exhibits no discharge. No scleral icterus.  Neck: Normal range of motion. Neck supple. No JVD present. No tracheal deviation present. No thyromegaly present.  Cardiovascular: Normal rate, regular rhythm, normal heart sounds and intact distal pulses.  Exam reveals no gallop and no friction rub.   No murmur heard. Pulmonary/Chest: Effort normal and breath sounds normal. No stridor. No respiratory distress. She has no wheezes.  She has no rales. She exhibits no tenderness.  Abdominal: Soft. Bowel sounds are normal. She exhibits  no distension and no mass. There is no tenderness. There is no rebound and no guarding.  Musculoskeletal: Normal range of motion. She exhibits no edema and no tenderness.  Lymphadenopathy:    She has no cervical adenopathy.  Neurological: She is alert and oriented to person, place, and time. She has normal reflexes. She displays normal reflexes. No cranial nerve deficit. She exhibits normal muscle tone. Coordination normal.  Skin: Skin is warm and dry. No rash noted. She is not diaphoretic. No erythema. No pallor.  Psychiatric: She has a normal mood and affect. Her behavior is normal. Judgment and thought content normal.        Lab Results  Component Value Date   WBC 6.2 07/04/2011   HGB 12.7 07/04/2011   HCT 36.3 07/04/2011   PLT 343.0 07/04/2011   CHOL 191 06/01/2011   TRIG 281.0* 06/01/2011   HDL 32.00* 06/01/2011   LDLDIRECT 117.5 06/01/2011   ALT 14 06/01/2011   AST 18 06/01/2011   NA 139 06/01/2011   K 4.6 06/01/2011   CL 102 06/01/2011   CREATININE 1.0 06/01/2011   BUN 13 06/01/2011   CO2 30 06/01/2011   TSH 2.15 11/01/2010    Assessment & Plan:

## 2011-07-18 NOTE — Assessment & Plan Note (Signed)
She has been out of oxybutinin b/c [facility] did not have it in stock

## 2011-07-18 NOTE — Assessment & Plan Note (Signed)
She was given a refill on phendimetrazine

## 2011-07-20 ENCOUNTER — Encounter (INDEPENDENT_AMBULATORY_CARE_PROVIDER_SITE_OTHER): Payer: Medicare Other | Admitting: Surgery

## 2011-09-06 ENCOUNTER — Encounter (INDEPENDENT_AMBULATORY_CARE_PROVIDER_SITE_OTHER): Payer: Self-pay | Admitting: Surgery

## 2011-09-06 ENCOUNTER — Ambulatory Visit (INDEPENDENT_AMBULATORY_CARE_PROVIDER_SITE_OTHER): Payer: Medicare Other | Admitting: Surgery

## 2011-09-06 DIAGNOSIS — K649 Unspecified hemorrhoids: Secondary | ICD-10-CM

## 2011-09-06 DIAGNOSIS — R197 Diarrhea, unspecified: Secondary | ICD-10-CM

## 2011-09-06 NOTE — Progress Notes (Signed)
Subjective:     Patient ID: Sherri Weber, female   DOB: 1933-02-28, 77 y.o.   MRN: [id number]  HPI  She is here for followup from July. Since eating she has seen Dr. Kinnie Scales and has had banding of hemorrhoids. She still has multiple loose bowel movement daily. She has minimal perianal discomfort. Review of Systems     Objective:   Physical Exam    On examination, there is still some swelling of the external hemorrhoidal tissue which is minimal and nontender. Digital examination was unremarkable. Assessment:     Patient with resolving hemorrhoids as well as a complaint of multiple loose bowel movements daily. Again, this is chronic for her.    Plan:     She tolerated A. she would like to consider a colostomy because of her multiple loose bowel movements. I believe that she needs to undergo more medical management before even considering this. I would not be in favor of doing a colostomy on her without her seeing a colorectal specialist to discuss this further in [place]. I will see her back as needed.

## 2011-09-27 ENCOUNTER — Encounter: Payer: Self-pay | Admitting: Internal Medicine

## 2011-09-27 ENCOUNTER — Ambulatory Visit (INDEPENDENT_AMBULATORY_CARE_PROVIDER_SITE_OTHER): Payer: Medicare Other | Admitting: Internal Medicine

## 2011-09-27 DIAGNOSIS — G4700 Insomnia, unspecified: Secondary | ICD-10-CM

## 2011-09-27 DIAGNOSIS — M545 Low back pain, unspecified: Secondary | ICD-10-CM

## 2011-09-27 DIAGNOSIS — I10 Essential (primary) hypertension: Secondary | ICD-10-CM

## 2011-09-27 DIAGNOSIS — E669 Obesity, unspecified: Secondary | ICD-10-CM

## 2011-09-27 DIAGNOSIS — K589 Irritable bowel syndrome without diarrhea: Secondary | ICD-10-CM

## 2011-09-27 DIAGNOSIS — K219 Gastro-esophageal reflux disease without esophagitis: Secondary | ICD-10-CM

## 2011-09-27 DIAGNOSIS — M1990 Unspecified osteoarthritis, unspecified site: Secondary | ICD-10-CM

## 2011-09-27 MED ORDER — ALIGN 4 MG PO CAPS: 1.0000 | ORAL_CAPSULE | Freq: Every day | ORAL | Status: DC

## 2011-09-27 MED ORDER — ZOLPIDEM TARTRATE 10 MG PO TABS: 10.0000 mg | ORAL_TABLET | Freq: Every evening | ORAL | Status: DC | PRN

## 2011-09-27 MED ORDER — ALPRAZOLAM 2 MG PO TABS: 2.0000 mg | ORAL_TABLET | Freq: Three times a day (TID) | ORAL | Status: DC | PRN

## 2011-09-27 MED ORDER — HYDROCODONE-ACETAMINOPHEN 5-500 MG PO TABS: 1.0000 | ORAL_TABLET | Freq: Four times a day (QID) | ORAL | Status: DC | PRN

## 2011-09-27 MED ORDER — PHENDIMETRAZINE TARTRATE ER 105 MG PO CP24: 1.0000 | ORAL_CAPSULE | Freq: Every day | ORAL | Status: DC

## 2011-09-27 NOTE — Progress Notes (Signed)
Subjective:    Patient ID: Sherri Weber, female    DOB: 07/18/1933, 77 y.o.   MRN: [id number]  Gastrophageal Reflux She complains of heartburn and nausea. She reports no abdominal pain, no belching, no chest pain, no choking, no coughing, no dysphagia, no early satiety, no globus sensation, no sore throat, no stridor, no tooth decay, no water brash or no wheezing. This is a chronic problem. The current episode started more than 1 year ago. The problem occurs rarely. The problem has been gradually improving. The heartburn duration is several minutes. The heartburn is located in the substernum. The heartburn is of mild intensity. The heartburn does not wake her from sleep. The heartburn does not limit her activity. The heartburn doesn't change with position. The symptoms are aggravated by certain foods. Pertinent negatives include no anemia, fatigue, melena, muscle weakness, orthopnea or weight loss. She has tried a PPI for the symptoms. The treatment provided significant relief.      Review of Systems  Constitutional: Negative for fever, chills, weight loss, diaphoresis, activity change, appetite change, fatigue and unexpected weight change.  HENT: Negative.  Negative for sore throat.   Eyes: Negative.   Respiratory: Negative for apnea, cough, choking, chest tightness, shortness of breath, wheezing and stridor.   Cardiovascular: Negative for chest pain, palpitations and leg swelling.  Gastrointestinal: Positive for heartburn, nausea and diarrhea. Negative for dysphagia, vomiting, abdominal pain, constipation, blood in stool, melena, abdominal distention, anal bleeding and rectal pain.  Genitourinary: Negative.   Musculoskeletal: Positive for arthralgias. Negative for myalgias, back pain, joint swelling, gait problem and muscle weakness.  Skin: Negative for color change, pallor, rash and wound.  Neurological: Negative for dizziness, tremors, seizures, syncope, facial asymmetry, speech  difficulty, weakness, light-headedness, numbness and headaches.  Hematological: Negative for adenopathy. Does not bruise/bleed easily.  Psychiatric/Behavioral: Positive for sleep disturbance. Negative for suicidal ideas, hallucinations, behavioral problems, confusion, self-injury, dysphoric mood, decreased concentration and agitation. The patient is nervous/anxious. The patient is not hyperactive.        Objective:   Physical Exam  Vitals reviewed. Constitutional: She is oriented to person, place, and time. She appears well-developed and well-nourished. No distress.  HENT:  Head: Normocephalic and atraumatic.  Mouth/Throat: Oropharynx is clear and moist. No oropharyngeal exudate.  Eyes: Conjunctivae are normal. Right eye exhibits no discharge. Left eye exhibits no discharge. No scleral icterus.  Neck: Normal range of motion. Neck supple. No JVD present. No tracheal deviation present. No thyromegaly present.  Cardiovascular: Normal rate, regular rhythm, normal heart sounds and intact distal pulses.  Exam reveals no gallop and no friction rub.   No murmur heard. Pulmonary/Chest: Effort normal and breath sounds normal. No stridor. No respiratory distress. She has no wheezes. She has no rales. She exhibits no tenderness.  Abdominal: Soft. Bowel sounds are normal. She exhibits no distension. There is no tenderness. There is no rebound and no guarding.  Musculoskeletal: Normal range of motion. She exhibits no edema and no tenderness.  Lymphadenopathy:    She has no cervical adenopathy.  Neurological: She is alert and oriented to person, place, and time. She has normal reflexes. She displays normal reflexes. No cranial nerve deficit. She exhibits normal muscle tone. Coordination normal.  Skin: Skin is warm and dry. No rash noted. She is not diaphoretic. No erythema. No pallor.  Psychiatric: She has a normal mood and affect. Her behavior is normal. Judgment and thought content normal.      Lab  Results  Component Value  Date   WBC 6.2 07/04/2011   HGB 12.7 07/04/2011   HCT 36.3 07/04/2011   PLT 343.0 07/04/2011   CHOL 191 06/01/2011   TRIG 281.0* 06/01/2011   HDL 32.00* 06/01/2011   LDLDIRECT 117.5 06/01/2011   ALT 14 06/01/2011   AST 18 06/01/2011   NA 139 06/01/2011   K 4.6 06/01/2011   CL 102 06/01/2011   CREATININE 1.0 06/01/2011   BUN 13 06/01/2011   CO2 30 06/01/2011   TSH 2.15 11/01/2010      Assessment & Plan:

## 2011-09-27 NOTE — Assessment & Plan Note (Addendum)
Start align, also she is seeing Dr. Lottie Mussel

## 2011-09-27 NOTE — Patient Instructions (Signed)

## 2011-09-28 NOTE — Assessment & Plan Note (Signed)
Pain med was refilled

## 2011-09-28 NOTE — Assessment & Plan Note (Signed)
Her BP is well controlled 

## 2011-09-28 NOTE — Assessment & Plan Note (Signed)
Continue ambien as needed.

## 2011-09-28 NOTE — Assessment & Plan Note (Signed)
This is well-controlled.

## 2011-09-28 NOTE — Assessment & Plan Note (Signed)
Continue same

## 2011-10-10 LAB — URINALYSIS, ROUTINE W REFLEX MICROSCOPIC
Bilirubin Urine: NEGATIVE
Glucose, UA: NEGATIVE
Hgb urine dipstick: NEGATIVE
Ketones, ur: NEGATIVE
Nitrite: POSITIVE — AB
Protein, ur: NEGATIVE
Specific Gravity, Urine: 1.011
Urobilinogen, UA: 1
pH: 6

## 2011-10-10 LAB — CBC
HCT: 36
Hemoglobin: 12.8
MCHC: 35.4
MCV: 88.7
Platelets: 369
RBC: 4.07
RDW: 13.6
WBC: 4.7

## 2011-10-10 LAB — URINE MICROSCOPIC-ADD ON

## 2011-10-10 LAB — COMPREHENSIVE METABOLIC PANEL
ALT: 24
AST: 26
Albumin: 3.7
Alkaline Phosphatase: 71
BUN: 9
CO2: 27
Calcium: 8.8
Chloride: 104
Creatinine, Ser: 0.89
GFR calc Af Amer: >60
GFR calc non Af Amer: >60
Glucose, Bld: 93
Potassium: 3.9
Sodium: 139
Total Bilirubin: 0.3
Total Protein: 7

## 2011-10-10 LAB — DIFFERENTIAL
Basophils Absolute: 0
Basophils Relative: 0
Eosinophils Absolute: 0.1
Eosinophils Relative: 2
Lymphocytes Relative: 18
Lymphs Abs: 0.8
Monocytes Absolute: 0.3
Monocytes Relative: 6
Neutro Abs: 3.5
Neutrophils Relative %: 74

## 2011-10-11 ENCOUNTER — Encounter: Payer: Self-pay | Admitting: Internal Medicine

## 2011-10-11 NOTE — Progress Notes (Signed)
Patient has changed GI doctor to Dr Sharrell Ku. Our system has been updated to reflect this.

## 2011-12-16 ENCOUNTER — Ambulatory Visit (INDEPENDENT_AMBULATORY_CARE_PROVIDER_SITE_OTHER): Payer: Medicare Other | Admitting: Internal Medicine

## 2011-12-16 VITALS — BP 140/86 | HR 80 | Temp 98.6°F | Resp 16 | Wt 140.0 lb

## 2011-12-16 DIAGNOSIS — K623 Rectal prolapse: Secondary | ICD-10-CM

## 2011-12-16 NOTE — Progress Notes (Signed)
  Subjective:    Patient ID: Sherri Weber, female    DOB: May 19, 1933, 77 y.o.   MRN: [id number]  HPI  She returns c/o persistent anal discomfort and wants me to refer her back to her surgeon that did the rectal prolapse repair, she tells me that she went to an [facility] several weeks ago and that Dr. Lyn Hollingshead told her that she thought she had a recurrence of the prolapse. The discomfort that she has is not related to BM's, position, or activity.  Review of Systems  Constitutional: Negative.   HENT: Negative.   Eyes: Negative.   Respiratory: Negative.   Cardiovascular: Negative.   Gastrointestinal: Positive for rectal pain. Negative for nausea, vomiting, abdominal pain, diarrhea, constipation, blood in stool, abdominal distention and anal bleeding.  Genitourinary: Negative.   Musculoskeletal: Negative.   Skin: Negative.   Neurological: Negative.   Hematological: Negative.   Psychiatric/Behavioral: Negative.        Objective:   Physical Exam  Vitals reviewed. Constitutional: She is oriented to person, place, and time. She appears well-developed and well-nourished. No distress.  HENT:  Head: Normocephalic and atraumatic.  Mouth/Throat: No oropharyngeal exudate.  Eyes: Conjunctivae are normal. Right eye exhibits no discharge. Left eye exhibits no discharge. No scleral icterus.  Neck: Normal range of motion. Neck supple. No JVD present. No tracheal deviation present. No thyromegaly present.  Cardiovascular: Normal rate, regular rhythm, normal heart sounds and intact distal pulses.  Exam reveals no gallop and no friction rub.   No murmur heard. Pulmonary/Chest: Effort normal and breath sounds normal. No stridor. No respiratory distress. She has no wheezes. She has no rales. She exhibits no tenderness.  Abdominal: Soft. Bowel sounds are normal. She exhibits no distension and no mass. There is no tenderness. There is no rebound and no guarding.  Genitourinary: Rectal exam shows external  hemorrhoid and anal tone abnormal. Rectal exam shows no internal hemorrhoid, no fissure, no mass and no tenderness. Guaiac negative stool. Right adnexum displays no mass, no tenderness and no fullness. Left adnexum displays no mass, no tenderness and no fullness.  Musculoskeletal: Normal range of motion. She exhibits no edema and no tenderness.  Lymphadenopathy:    She has no cervical adenopathy.  Neurological: She is oriented to person, place, and time.  Skin: Skin is warm and dry. No rash noted. She is not diaphoretic. No erythema. No pallor.  Psychiatric: She has a normal mood and affect. Her behavior is normal. Judgment and thought content normal.     Lab Results  Component Value Date   WBC 6.2 07/04/2011   HGB 12.7 07/04/2011   HCT 36.3 07/04/2011   PLT 343.0 07/04/2011   GLUCOSE 89 06/01/2011   CHOL 191 06/01/2011   TRIG 281.0* 06/01/2011   HDL 32.00* 06/01/2011   LDLDIRECT 117.5 06/01/2011   LDLCALC 122* 06/02/2009   ALT 14 06/01/2011   AST 18 06/01/2011   NA 139 06/01/2011   K 4.6 06/01/2011   CL 102 06/01/2011   CREATININE 1.0 06/01/2011   BUN 13 06/01/2011   CO2 30 06/01/2011   TSH 2.15 11/01/2010       Assessment & Plan:

## 2011-12-19 ENCOUNTER — Encounter: Payer: Self-pay | Admitting: Internal Medicine

## 2011-12-19 NOTE — Assessment & Plan Note (Signed)
On exam today she has some external hemorrhoids but no other pathology and I do not see any evidence of recurrent prolapse, none the less I have ordered a referral for her to see her surgeon in [place] that did the repair of the rectal prolapse.

## 2012-02-28 ENCOUNTER — Ambulatory Visit (INDEPENDENT_AMBULATORY_CARE_PROVIDER_SITE_OTHER): Payer: Medicare PPO | Admitting: Internal Medicine

## 2012-02-28 ENCOUNTER — Other Ambulatory Visit (INDEPENDENT_AMBULATORY_CARE_PROVIDER_SITE_OTHER): Payer: Medicare PPO

## 2012-02-28 DIAGNOSIS — Z23 Encounter for immunization: Secondary | ICD-10-CM

## 2012-02-28 DIAGNOSIS — M545 Low back pain, unspecified: Secondary | ICD-10-CM

## 2012-02-28 DIAGNOSIS — M899 Disorder of bone, unspecified: Secondary | ICD-10-CM

## 2012-02-28 DIAGNOSIS — I10 Essential (primary) hypertension: Secondary | ICD-10-CM

## 2012-02-28 DIAGNOSIS — M8580 Other specified disorders of bone density and structure, unspecified site: Secondary | ICD-10-CM

## 2012-02-28 DIAGNOSIS — E785 Hyperlipidemia, unspecified: Secondary | ICD-10-CM

## 2012-02-28 LAB — URINALYSIS, ROUTINE W REFLEX MICROSCOPIC
Bilirubin Urine: NEGATIVE
Hgb urine dipstick: NEGATIVE
Ketones, ur: NEGATIVE
Nitrite: POSITIVE
Specific Gravity, Urine: >=1.03 (ref 1.000–1.030)
Total Protein, Urine: NEGATIVE
Urine Glucose: NEGATIVE
Urobilinogen, UA: 0.2 (ref 0.0–1.0)
pH: 5.5 (ref 5.0–8.0)

## 2012-02-28 LAB — COMPREHENSIVE METABOLIC PANEL
ALT: 16 U/L (ref 0–35)
AST: 21 U/L (ref 0–37)
Albumin: 3.8 g/dL (ref 3.5–5.2)
Alkaline Phosphatase: 70 U/L (ref 39–117)
BUN: 15 mg/dL (ref 6–23)
CO2: 28 mEq/L (ref 19–32)
Calcium: 9.2 mg/dL (ref 8.4–10.5)
Chloride: 103 mEq/L (ref 96–112)
Creatinine, Ser: 0.8 mg/dL (ref 0.4–1.2)
GFR: 75.05 mL/min (ref >60.00)
Glucose, Bld: 94 mg/dL (ref 70–99)
Potassium: 4.9 mEq/L (ref 3.5–5.1)
Sodium: 135 mEq/L (ref 135–145)
Total Bilirubin: 0.2 mg/dL — ABNORMAL LOW (ref 0.3–1.2)
Total Protein: 7 g/dL (ref 6.0–8.3)

## 2012-02-28 LAB — CBC WITH DIFFERENTIAL/PLATELET
Basophils Absolute: 0.1 10*3/uL (ref 0.0–0.1)
Basophils Relative: 1.2 % (ref 0.0–3.0)
Eosinophils Absolute: 0.2 10*3/uL (ref 0.0–0.7)
Eosinophils Relative: 3.6 % (ref 0.0–5.0)
HCT: 40.4 % (ref 36.0–46.0)
Hemoglobin: 13.8 g/dL (ref 12.0–15.0)
Lymphocytes Relative: 18.4 % (ref 12.0–46.0)
Lymphs Abs: 1.2 10*3/uL (ref 0.7–4.0)
MCHC: 34.3 g/dL (ref 30.0–36.0)
MCV: 93.7 fl (ref 78.0–100.0)
Monocytes Absolute: 0.7 10*3/uL (ref 0.1–1.0)
Monocytes Relative: 10.6 % (ref 3.0–12.0)
Neutro Abs: 4.3 10*3/uL (ref 1.4–7.7)
Neutrophils Relative %: 66.2 % (ref 43.0–77.0)
Platelets: 307 10*3/uL (ref 150.0–400.0)
RBC: 4.31 Mil/uL (ref 3.87–5.11)
RDW: 13.9 % (ref 11.5–14.6)
WBC: 6.5 10*3/uL (ref 4.5–10.5)

## 2012-02-28 LAB — TSH: TSH: 3.39 u[IU]/mL (ref 0.35–5.50)

## 2012-02-28 LAB — LDL CHOLESTEROL, DIRECT: Direct LDL: 65 mg/dL

## 2012-02-28 LAB — LIPID PANEL
Cholesterol: 137 mg/dL (ref 0–200)
HDL: 27.5 mg/dL — ABNORMAL LOW (ref >39.00)
Total CHOL/HDL Ratio: 5
Triglycerides: 304 mg/dL — ABNORMAL HIGH (ref 0.0–149.0)
VLDL: 60.8 mg/dL — ABNORMAL HIGH (ref 0.0–40.0)

## 2012-02-28 MED ORDER — CELECOXIB 200 MG PO CAPS
200.0000 mg | ORAL_CAPSULE | Freq: Every day | ORAL | Status: AC
Start: 2012-02-28 — End: 2012-03-29

## 2012-02-28 NOTE — Patient Instructions (Signed)
Osteoporosis Osteoporosis is a disease of the bones that makes them weaker and prone to break (fracture). By their mid-30s, most people begin to gradually lose bone strength. If this is severe enough, osteoporosis may occur. Osteopenia is a less severe weakness of the bones, which places you at risk for osteoporosis. It is important to identify if you have osteoporosis or osteopenia. Bone fractures from osteoporosis (especially hip and spine fractures) are a major cause of hospitalization, loss of independence, and can lead to life-threatening complications. CAUSES  There are a number of causes and risk factors:  Gender. Women are at a higher risk for osteoporosis than men.   Age. Bone formation slows down with age.   Ethnicity. For unclear reasons, white and Asian women are at higher risk for osteoporosis. Hispanic and African American women are at increased, but lesser, risk.   Family history of osteoporosis can mean that you are at a higher risk for getting it.   History of bone fractures indicates you may be at higher risk of another.   Calcium is very important for bone health and strength. Not enough calcium in your diet increases your risk for osteoporosis. Vitamin D is important for calcium metabolism. You get vitamin D from sunlight, foods, or supplements.   Physical activity. Bones get stronger with weight-bearing exercise and weaker without use.   Smoking is associated with decreased bone strength.   Medicines. Cortisone medicines, too much thyroid medicine, some cancer and seizure medicines, and others can weaken bones and cause osteoporosis.   Decreased body weight is associated with osteoporosis. The small amount of estrogen-type molecules produced in fat cells seems to protect the bones.   Menopausal decrease in the hormone estrogen can cause osteoporosis.   Low levels of the hormone testosterone can cause osteoporosis.   Some medical conditions can lead to osteoporosis  (hyperthyroidism, hyperparathyroidism, B12 deficiency).  SYMPTOMS  Usually, no symptoms are felt as the bones weaken. The first symptoms are generally related to bone fractures. You may have silent, tiny bone fractures, especially in your spine. This can cause height loss and forward bending of the spine (kyphosis). DIAGNOSIS  You or your caregiver may suspect osteoporosis based on height loss and kyphosis. Osteoporosis or osteopenia may be identified on an X-ray done for other reasons. A bone density measurement will likely be taken. Your bones are often measured at your lower spine or your hips. Measurement is done by an X-ray called a DEXA scan, or sometimes by a computerized X-ray scan (CT or CAT scan). Other tests may be done to find the cause of osteoporosis, such as blood tests to measure calcium and vitamin D, or to monitor treatment. TREATMENT  The goal of osteoporosis treatment is to prevent fractures. This is done through medicine and home care treatments. Treatment will slow the weakening of your bones and strengthen them where possible. Measures to decrease the likelihood of falling and fracturing a bone are also important. Medicine  You may need supplements if you are not getting enough calcium, vitamin D, and vitamin B12.   If you are female and menopausal, you should discuss the option of estrogen replacement or estrogen-like medicine with your caregiver.   Medicines can be taken by mouth or injection to help build bone strength. When taken by mouth, there are important directions that you need to follow.   Calcitonin is a hormone made by the thyroid gland that can help build bone strength and decrease fracture risk in the spine. It   can be taken by nasal spray or injection.   Parathyroid hormone can be injected to help build bone strength.   You will need to continue to get enough calcium intake with any of these medicines.  FALL PREVENTION  If you are unsteady on your feet, use  a cane, walker, or walk with someone's help.   Remove loose rugs or electrical cords from your home.   Keep your home well lit at night. Use glasses if you need them.   Avoid icy streets and wet or waxed floors.   Hold the railing when using stairs.   Watch out for your pets.   Install grab bars in your bathroom.   Exercise. Physical activity, especially weight-bearing exercise, helps strengthen bones. Strength and balance exercise, such as tai chi, helps prevent falls.   Alcohol and some medicines can make you more likely to fall. Discuss alcohol use with your caregiver. Ask your caregiver if any of your medicines might increase your risk for falling. Ask if safer alternatives are available.  HOME CARE INSTRUCTIONS   Try to prevent and avoid falls.   To pick up objects, bend at the knees. Do not bend with your back.   Do not smoke. If you smoke, ask for help to stop.   Have adequate calcium and vitamin D in your diet. Talk with your caregiver about amounts.   Before exercising, ask your caregiver what exercises will be good for you.   Only take over-the-counter or prescription medicines for pain, discomfort, or fever as directed by your caregiver.  SEEK MEDICAL CARE IF:   You have had a fracture and your pain is not controlled.   You have had a fracture and you are not able to return to activities as expected.   You are reinjured.   You develop side effects from medicines, especially stomach pain or trouble swallowing.   You develop new, unexplained problems.  SEEK IMMEDIATE MEDICAL CARE IF:   You develop sudden, severe pain in your back.   You develop pain after an injury or fall.  Document Released: 09/21/2005 Document Revised: 12/01/2011 Document Reviewed: 11/26/2011 [facility] Patient Information 2012 [place], [place].Back Pain, Adult Low back pain is very common. About 1 in 5 people have back pain.The cause of low back pain is rarely dangerous. The pain often gets  better over time.About half of people with a sudden onset of back pain feel better in just 2 weeks. About 8 in 10 people feel better by 6 weeks.  CAUSES Some common causes of back pain include:  Strain of the muscles or ligaments supporting the spine.   Wear and tear (degeneration) of the spinal discs.   Arthritis.   Direct injury to the back.  DIAGNOSIS Most of the time, the direct cause of low back pain is not known.However, back pain can be treated effectively even when the exact cause of the pain is unknown.Answering your caregiver's questions about your overall health and symptoms is one of the most accurate ways to make sure the cause of your pain is not dangerous. If your caregiver needs more information, he or she may order lab work or imaging tests (X-rays or MRIs).However, even if imaging tests show changes in your back, this usually does not require surgery. HOME CARE INSTRUCTIONS For many people, back pain returns.Since low back pain is rarely dangerous, it is often a condition that people can learn to [facility] their own.   Remain active. It is stressful on the back  to sit or stand in one place. Do not sit, drive, or stand in one place for more than 30 minutes at a time. Take short walks on level surfaces as soon as pain allows.Try to increase the length of time you walk each day.   Do not stay in bed.Resting more than 1 or 2 days can delay your recovery.   Do not avoid exercise or work.Your body is made to move.It is not dangerous to be active, even though your back may hurt.Your back will likely heal faster if you return to being active before your pain is gone.   Pay attention to your body when you bend and lift. Many people have less discomfortwhen lifting if they bend their knees, keep the load close to their bodies,and avoid twisting. Often, the most comfortable positions are those that put less stress on your recovering back.   Find a comfortable position to  sleep. Use a firm mattress and lie on your side with your knees slightly bent. If you lie on your back, put a pillow under your knees.   Only take over-the-counter or prescription medicines as directed by your caregiver. Over-the-counter medicines to reduce pain and inflammation are often the most helpful.Your caregiver may prescribe muscle relaxant drugs.These medicines help dull your pain so you can more quickly return to your normal activities and healthy exercise.   Put ice on the injured area.   Put ice in a plastic bag.   Place a towel between your skin and the bag.   Leave the ice on for 15 to 20 minutes, 3 to 4 times a day for the first 2 to 3 days. After that, ice and heat may be alternated to reduce pain and spasms.   Ask your caregiver about trying back exercises and gentle massage. This may be of some benefit.   Avoid feeling anxious or stressed.Stress increases muscle tension and can worsen back pain.It is important to recognize when you are anxious or stressed and learn ways to manage it.Exercise is a great option.  SEEK MEDICAL CARE IF:  You have pain that is not relieved with rest or medicine.   You have pain that does not improve in 1 week.   You have new symptoms.   You are generally not feeling well.  SEEK IMMEDIATE MEDICAL CARE IF:   You have pain that radiates from your back into your legs.   You develop new bowel or bladder control problems.   You have unusual weakness or numbness in your arms or legs.   You develop nausea or vomiting.   You develop abdominal pain.   You feel faint.  Document Released: 12/12/2005 Document Revised: 12/01/2011 Document Reviewed: 05/02/2011 [facility] Patient Information 2012 [place], [place].

## 2012-03-02 ENCOUNTER — Ambulatory Visit (INDEPENDENT_AMBULATORY_CARE_PROVIDER_SITE_OTHER)
Admission: RE | Admit: 2012-03-02 | Discharge: 2012-03-02 | Disposition: A | Discharge status: Home / Self Care | Payer: Medicare PPO | Source: Ambulatory Visit

## 2012-03-02 ENCOUNTER — Encounter: Payer: Self-pay | Admitting: Internal Medicine

## 2012-03-02 DIAGNOSIS — M545 Low back pain, unspecified: Secondary | ICD-10-CM

## 2012-03-02 DIAGNOSIS — M899 Disorder of bone, unspecified: Secondary | ICD-10-CM

## 2012-03-02 DIAGNOSIS — M8580 Other specified disorders of bone density and structure, unspecified site: Secondary | ICD-10-CM

## 2012-03-02 DIAGNOSIS — E785 Hyperlipidemia, unspecified: Secondary | ICD-10-CM

## 2012-03-02 DIAGNOSIS — M949 Disorder of cartilage, unspecified: Secondary | ICD-10-CM

## 2012-03-02 DIAGNOSIS — I10 Essential (primary) hypertension: Secondary | ICD-10-CM

## 2012-03-02 LAB — VITAMIN D 1,25 DIHYDROXY
Vitamin D 1, 25 (OH)2 Total: 44 pg/mL (ref 18–72)
Vitamin D2 1, 25 (OH)2: <8 pg/mL
Vitamin D3 1, 25 (OH)2: 44 pg/mL

## 2012-03-04 ENCOUNTER — Encounter: Payer: Self-pay | Admitting: Internal Medicine

## 2012-03-04 NOTE — Assessment & Plan Note (Signed)
BMD ordered, labs ordered to look for secondary causes

## 2012-03-04 NOTE — Assessment & Plan Note (Signed)
I will check her FLP today 

## 2012-03-04 NOTE — Progress Notes (Signed)
Subjective:    Patient ID: Sherri Weber, female    DOB: [birth year], 78 y.o.   MRN: [id number]  Back Pain This is a chronic problem. The current episode started more than 1 year ago. The problem occurs intermittently. The problem has been gradually worsening since onset. The pain is present in the lumbar spine. The quality of the pain is described as aching. The pain does not radiate. The pain is at a severity of 5/10. The pain is moderate. The pain is worse during the day. The symptoms are aggravated by bending. Pertinent negatives include no abdominal pain, bladder incontinence, bowel incontinence, chest pain, dysuria, fever, headaches, leg pain, numbness, paresis, paresthesias, pelvic pain, perianal numbness, tingling, weakness or weight loss. She has tried analgesics, muscle relaxant and walking for the symptoms. The treatment provided mild relief.  She tells me that she went to see a back surgeon (?who) in [place] and had xrays done and she was told that she did not need to have surgery done so now she wants to be referred to a pain specialist.    Review of Systems  Constitutional: Positive for fatigue. Negative for fever, chills, weight loss, diaphoresis, activity change, appetite change and unexpected weight change.  HENT: Negative.   Eyes: Negative.   Respiratory: Negative for apnea, cough, choking, chest tightness, shortness of breath, wheezing and stridor.   Cardiovascular: Negative for chest pain, palpitations and leg swelling.  Gastrointestinal: Negative for nausea, vomiting, abdominal pain, diarrhea, constipation, blood in stool, anal bleeding, rectal pain and bowel incontinence.  Genitourinary: Negative for bladder incontinence, dysuria, urgency, frequency, hematuria, flank pain, decreased urine volume, enuresis, difficulty urinating, pelvic pain and dyspareunia.  Musculoskeletal: Positive for back pain. Negative for myalgias, joint swelling, arthralgias and gait problem.    Skin: Negative for color change, pallor, rash and wound.  Neurological: Negative for dizziness, tingling, tremors, seizures, syncope, facial asymmetry, speech difficulty, weakness, light-headedness, numbness, headaches and paresthesias.  Hematological: Negative for adenopathy. Does not bruise/bleed easily.  Psychiatric/Behavioral: Negative.        Objective:   Physical Exam  Vitals reviewed. Constitutional: She is oriented to person, place, and time. She appears well-developed and well-nourished. No distress.  HENT:  Head: Normocephalic and atraumatic.  Mouth/Throat: Oropharynx is clear and moist. No oropharyngeal exudate.  Eyes: Conjunctivae are normal. Right eye exhibits no discharge. Left eye exhibits no discharge. No scleral icterus.  Neck: Normal range of motion. Neck supple. No JVD present. No tracheal deviation present. No thyromegaly present.  Cardiovascular: Normal rate, regular rhythm, normal heart sounds and intact distal pulses.  Exam reveals no gallop and no friction rub.   No murmur heard. Pulmonary/Chest: Effort normal and breath sounds normal. No stridor. No respiratory distress. She has no wheezes. She has no rales. She exhibits no tenderness.  Abdominal: Soft. Bowel sounds are normal. She exhibits no distension and no mass. There is no tenderness. There is no rebound and no guarding.  Musculoskeletal: Normal range of motion. She exhibits no edema and no tenderness.       Lumbar back: Normal. She exhibits normal range of motion, no tenderness, no bony tenderness, no swelling, no edema, no deformity, no laceration, no pain, no spasm and normal pulse.  Lymphadenopathy:    She has no cervical adenopathy.  Neurological: She is oriented to person, place, and time.  Skin: Skin is warm and dry. No rash noted. She is not diaphoretic. No erythema. No pallor.  Psychiatric: She has a normal mood and affect.  Her behavior is normal. Judgment and thought content normal.      Lab  Results  Component Value Date   WBC 6.5 02/28/2012   HGB 13.8 02/28/2012   HCT 40.4 02/28/2012   PLT 307.0 02/28/2012   GLUCOSE 94 02/28/2012   CHOL 137 02/28/2012   TRIG 304.0* 02/28/2012   HDL 27.50* 02/28/2012   LDLDIRECT 65.0 02/28/2012   LDLCALC 122* 06/02/2009   ALT 16 02/28/2012   AST 21 02/28/2012   NA 135 02/28/2012   K 4.9 02/28/2012   CL 103 02/28/2012   CREATININE 0.8 02/28/2012   BUN 15 02/28/2012   CO2 28 02/28/2012   TSH 3.39 02/28/2012      Assessment & Plan:

## 2012-03-04 NOTE — Assessment & Plan Note (Signed)
Her BP is well controlled 

## 2012-03-04 NOTE — Assessment & Plan Note (Signed)
I will add celebrex to the other meds and have referred to pain management

## 2012-03-06 ENCOUNTER — Ambulatory Visit (INDEPENDENT_AMBULATORY_CARE_PROVIDER_SITE_OTHER): Payer: Medicare PPO | Admitting: Internal Medicine

## 2012-03-06 ENCOUNTER — Encounter: Payer: Self-pay | Admitting: Internal Medicine

## 2012-03-06 VITALS — BP 122/80 | HR 80 | Temp 97.9°F | Resp 16 | Wt 141.0 lb

## 2012-03-06 DIAGNOSIS — I10 Essential (primary) hypertension: Secondary | ICD-10-CM

## 2012-03-06 DIAGNOSIS — E785 Hyperlipidemia, unspecified: Secondary | ICD-10-CM

## 2012-03-06 DIAGNOSIS — M545 Low back pain, unspecified: Secondary | ICD-10-CM

## 2012-03-06 DIAGNOSIS — E669 Obesity, unspecified: Secondary | ICD-10-CM

## 2012-03-06 DIAGNOSIS — G4700 Insomnia, unspecified: Secondary | ICD-10-CM

## 2012-03-06 DIAGNOSIS — M8580 Other specified disorders of bone density and structure, unspecified site: Secondary | ICD-10-CM

## 2012-03-06 DIAGNOSIS — F329 Major depressive disorder, single episode, unspecified: Secondary | ICD-10-CM

## 2012-03-06 DIAGNOSIS — F3289 Other specified depressive episodes: Secondary | ICD-10-CM

## 2012-03-06 DIAGNOSIS — M1990 Unspecified osteoarthritis, unspecified site: Secondary | ICD-10-CM

## 2012-03-06 DIAGNOSIS — M899 Disorder of bone, unspecified: Secondary | ICD-10-CM

## 2012-03-06 MED ORDER — CHOLINE FENOFIBRATE 135 MG PO CPDR: 135.0000 mg | DELAYED_RELEASE_CAPSULE | Freq: Every day | ORAL | Status: DC

## 2012-03-06 MED ORDER — PHENDIMETRAZINE TARTRATE ER 105 MG PO CP24: 1.0000 | ORAL_CAPSULE | Freq: Every day | ORAL | Status: DC

## 2012-03-06 MED ORDER — ZOLPIDEM TARTRATE 10 MG PO TABS: 10.0000 mg | ORAL_TABLET | Freq: Every evening | ORAL | Status: DC | PRN

## 2012-03-06 MED ORDER — ALPRAZOLAM 2 MG PO TABS: 2.0000 mg | ORAL_TABLET | Freq: Three times a day (TID) | ORAL | Status: DC | PRN

## 2012-03-06 MED ORDER — HYDROCODONE-ACETAMINOPHEN 5-500 MG PO TABS: 1.0000 | ORAL_TABLET | Freq: Four times a day (QID) | ORAL | Status: DC | PRN

## 2012-03-06 NOTE — Assessment & Plan Note (Signed)
Continue current meds for pain 

## 2012-03-06 NOTE — Assessment & Plan Note (Signed)
Continue current meds 

## 2012-03-06 NOTE — Assessment & Plan Note (Addendum)
She has high trigs so have asked her to start taking trilipix and go in for a nutrition referral

## 2012-03-06 NOTE — Patient Instructions (Addendum)
Hypertriglyceridemia  Diet for High blood levels of Triglycerides Most fats in food are triglycerides. Triglycerides in your blood are stored as fat in your body. High levels of triglycerides in your blood may put you at a greater risk for heart disease and stroke.  Normal triglyceride levels are less than 150 mg/dL. Borderline high levels are 150-199 mg/dl. High levels are 200 - 499 mg/dL, and very high triglyceride levels are greater than 500 mg/dL. The decision to treat high triglycerides is generally based on the level. For people with borderline or high triglyceride levels, treatment includes weight loss and exercise. Drugs are recommended for people with very high triglyceride levels. Many people who need treatment for high triglyceride levels have metabolic syndrome. This syndrome is a collection of disorders that often include: insulin resistance, high blood pressure, blood clotting problems, high cholesterol and triglycerides. TESTING PROCEDURE FOR TRIGLYCERIDES  You should not eat 4 hours before getting your triglycerides measured. The normal range of triglycerides is between 10 and 250 milligrams per deciliter (mg/dl). Some people may have extreme levels (1000 or above), but your triglyceride level may be too high if it is above 150 mg/dl, depending on what other risk factors you have for heart disease.   People with high blood triglycerides may also have high blood cholesterol levels. If you have high blood cholesterol as well as high blood triglycerides, your risk for heart disease is probably greater than if you only had high triglycerides. High blood cholesterol is one of the main risk factors for heart disease.  CHANGING YOUR DIET  Your weight can affect your blood triglyceride level. If you are more than 20% above your ideal body weight, you may be able to lower your blood triglycerides by losing weight. Eating less and exercising regularly is the best way to combat this. Fat provides  more calories than any other food. The best way to lose weight is to eat less fat. Only 30% of your total calories should come from fat. Less than 7% of your diet should come from saturated fat. A diet low in fat and saturated fat is the same as a diet to decrease blood cholesterol. By eating a diet lower in fat, you may lose weight, lower your blood cholesterol, and lower your blood triglyceride level.  Eating a diet low in fat, especially saturated fat, may also help you lower your blood triglyceride level. Ask your dietitian to help you figure how much fat you can eat based on the number of calories your caregiver has prescribed for you.  Exercise, in addition to helping with weight loss may also help lower triglyceride levels.   Alcohol can increase blood triglycerides. You may need to stop drinking alcoholic beverages.   Too much carbohydrate in your diet may also increase your blood triglycerides. Some complex carbohydrates are necessary in your diet. These may include bread, rice, potatoes, other starchy vegetables and cereals.   Reduce "simple" carbohydrates. These may include pure sugars, candy, honey, and jelly without losing other nutrients. If you have the kind of high blood triglycerides that is affected by the amount of carbohydrates in your diet, you will need to eat less sugar and less high-sugar foods. Your caregiver can help you with this.   Adding 2-4 grams of fish oil (EPA+ DHA) may also help lower triglycerides. Speak with your caregiver before adding any supplements to your regimen.  Following the Diet  Maintain your ideal weight. Your caregivers can help you with a diet. Generally,   eating less food and getting more exercise will help you lose weight. Joining a weight control group may also help. Ask your caregivers for a good weight control group in your area.  Eat low-fat foods instead of high-fat foods. This can help you lose weight too.  These foods are lower in fat. Eat MORE  of these:   Dried beans, peas, and lentils.   Egg whites.   Low-fat cottage cheese.   Fish.   Lean cuts of meat, such as round, sirloin, rump, and flank (cut extra fat off meat you fix).   Whole grain breads, cereals and pasta.   Skim and nonfat dry milk.   Low-fat yogurt.   Poultry without the skin.   Cheese made with skim or part-skim milk, such as mozzarella, parmesan, farmers', ricotta, or pot cheese.  These are higher fat foods. Eat LESS of these:   Whole milk and foods made from whole milk, such as American, blue, cheddar, monterey jack, and swiss cheese   High-fat meats, such as luncheon meats, sausages, knockwurst, bratwurst, hot dogs, ribs, corned beef, ground pork, and regular ground beef.   Fried foods.  Limit saturated fats in your diet. Substituting unsaturated fat for saturated fat may decrease your blood triglyceride level. You will need to read package labels to know which products contain saturated fats.  These foods are high in saturated fat. Eat LESS of these:   Fried pork skins.   Whole milk.   Skin and fat from poultry.   Palm oil.   Butter.   Shortening.   Cream cheese.   Bacon.   Margarines and baked goods made from listed oils.   Vegetable shortenings.   Chitterlings.   Fat from meats.   Coconut oil.   Palm kernel oil.   Lard.   Cream.   Sour cream.   Fatback.   Coffee whiteners and non-dairy creamers made with these oils.   Cheese made from whole milk.  Use unsaturated fats (both polyunsaturated and monounsaturated) moderately. Remember, even though unsaturated fats are better than saturated fats; you still want a diet low in total fat.  These foods are high in unsaturated fat:   Canola oil.   Sunflower oil.   Mayonnaise.   Almonds.   Peanuts.   Pine nuts.   Margarines made with these oils.   Safflower oil.   Olive oil.   Avocados.   Cashews.   Peanut butter.   Sunflower seeds.   Soybean oil.     Peanut oil.   Olives.   Pecans.   Walnuts.   Pumpkin seeds.  Avoid sugar and other high-sugar foods. This will decrease carbohydrates without decreasing other nutrients. Sugar in your food goes rapidly to your blood. When there is excess sugar in your blood, your liver may use it to make more triglycerides. Sugar also contains calories without other important nutrients.  Eat LESS of these:   Sugar, brown sugar, powdered sugar, jam, jelly, preserves, honey, syrup, molasses, pies, candy, cakes, cookies, frosting, pastries, colas, soft drinks, punches, fruit drinks, and regular gelatin.   Avoid alcohol. Alcohol, even more than sugar, may increase blood triglycerides. In addition, alcohol is high in calories and low in nutrients. Ask for sparkling water, or a diet soft drink instead of an alcoholic beverage.  Suggestions for planning and preparing meals   Bake, broil, grill or roast meats instead of frying.   Remove fat from meats and skin from poultry before cooking.   Add spices,   herbs, lemon juice or vinegar to vegetables instead of salt, rich sauces or gravies.   Use a non-stick skillet without fat or use no-stick sprays.   Cool and refrigerate stews and broth. Then remove the hardened fat floating on the surface before serving.   Refrigerate meat drippings and skim off fat to make low-fat gravies.   Serve more fish.   Use less butter, margarine and other high-fat spreads on bread or vegetables.   Use skim or reconstituted non-fat dry milk for cooking.   Cook with low-fat cheeses.   Substitute low-fat yogurt or cottage cheese for all or part of the sour cream in recipes for sauces, dips or congealed salads.   Use half yogurt/half mayonnaise in salad recipes.   Substitute evaporated skim milk for cream. Evaporated skim milk or reconstituted non-fat dry milk can be whipped and substituted for whipped cream in certain recipes.   Choose fresh fruits for dessert instead of  high-fat foods such as pies or cakes. Fruits are naturally low in fat.  When Dining Out   Order low-fat appetizers such as fruit or vegetable juice, pasta with vegetables or tomato sauce.   Select clear, rather than cream soups.   Ask that dressings and gravies be served on the side. Then use less of them.   Order foods that are baked, broiled, poached, steamed, stir-fried, or roasted.   Ask for margarine instead of butter, and use only a small amount.   Drink sparkling water, unsweetened tea or coffee, or diet soft drinks instead of alcohol or other sweet beverages.  QUESTIONS AND ANSWERS ABOUT OTHER FATS IN THE BLOOD: SATURATED FAT, TRANS FAT, AND CHOLESTEROL What is trans fat? Trans fat is a type of fat that is formed when vegetable oil is hardened through a process called hydrogenation. This process helps makes foods more solid, gives them shape, and prolongs their shelf life. Trans fats are also called hydrogenated or partially hydrogenated oils.  What do saturated fat, trans fat, and cholesterol in foods have to do with heart disease? Saturated fat, trans fat, and cholesterol in the diet all raise the level of LDL "bad" cholesterol in the blood. The higher the LDL cholesterol, the greater the risk for coronary heart disease (CHD). Saturated fat and trans fat raise LDL similarly.  What foods contain saturated fat, trans fat, and cholesterol? High amounts of saturated fat are found in animal products, such as fatty cuts of meat, chicken skin, and full-fat dairy products like butter, whole milk, cream, and cheese, and in tropical vegetable oils such as palm, palm kernel, and coconut oil. Trans fat is found in some of the same foods as saturated fat, such as vegetable shortening, some margarines (especially hard or stick margarine), crackers, cookies, baked goods, fried foods, salad dressings, and other processed foods made with partially hydrogenated vegetable oils. Small amounts of trans fat  also occur naturally in some animal products, such as milk products, beef, and lamb. Foods high in cholesterol include liver, other organ meats, egg yolks, shrimp, and full-fat dairy products. How can I use the new food label to make heart-healthy food choices? Check the Nutrition Facts panel of the food label. Choose foods lower in saturated fat, trans fat, and cholesterol. For saturated fat and cholesterol, you can also use the Percent Daily Value (%DV): 5% DV or less is low, and 20% DV or more is high. (There is no %DV for trans fat.) Use the Nutrition Facts panel to choose foods low in   saturated fat and cholesterol, and if the trans fat is not listed, read the ingredients and limit products that list shortening or hydrogenated or partially hydrogenated vegetable oil, which tend to be high in trans fat. POINTS TO REMEMBER: YOU NEED A LITTLE TLC (THERAPEUTIC LIFESTYLE CHANGES)  Discuss your risk for heart disease with your caregivers, and take steps to reduce risk factors.   Change your diet. Choose foods that are low in saturated fat, trans fat, and cholesterol.   Add exercise to your daily routine if it is not already being done. Participate in physical activity of moderate intensity, like brisk walking, for at least 30 minutes on most, and preferably all days of the week. No time? Break the 30 minutes into three, 10-minute segments during the day.   Stop smoking. If you do smoke, contact your caregiver to discuss ways in which they can help you quit.   Do not use street drugs.   Maintain a normal weight.   Maintain a healthy blood pressure.   Keep up with your blood work for checking the fats in your blood as directed by your caregiver.  Document Released: 09/29/2004 Document Revised: 12/01/2011 Document Reviewed: 04/27/2009 [employer] Patient Information 2012 [employer]. 

## 2012-03-06 NOTE — Progress Notes (Signed)
Subjective:    Patient ID: Sherri Weber, female    DOB: Dec 24, 1933, 78 y.o.   MRN: [id number]  Hyperlipidemia This is a chronic problem. The current episode started more than 1 year ago. The problem is uncontrolled. Recent lipid tests were reviewed and are variable. Exacerbating diseases include obesity. She has no history of chronic renal disease, diabetes, hypothyroidism, liver disease or nephrotic syndrome. Factors aggravating her hyperlipidemia include fatty foods. Pertinent negatives include no chest pain, focal sensory loss, focal weakness, leg pain, myalgias or shortness of breath. She is currently on no antihyperlipidemic treatment. The current treatment provides no improvement of lipids. Compliance problems include adherence to exercise and adherence to diet.       Review of Systems  Constitutional: Negative for fever, chills, diaphoresis, activity change, appetite change, fatigue and unexpected weight change.  HENT: Negative.   Eyes: Negative.   Respiratory: Negative for cough, choking, shortness of breath, wheezing and stridor.   Cardiovascular: Negative for chest pain, palpitations and leg swelling.  Gastrointestinal: Negative for nausea, vomiting, abdominal pain, diarrhea, constipation, blood in stool, abdominal distention and anal bleeding.  Genitourinary: Negative.   Musculoskeletal: Positive for back pain (chronic, unchanged) and arthralgias (chronic, unchanged). Negative for myalgias, joint swelling and gait problem.  Skin: Negative for color change, pallor, rash and wound.  Neurological: Negative for dizziness, tremors, focal weakness, seizures, syncope, facial asymmetry, speech difficulty, weakness, light-headedness, numbness and headaches.  Hematological: Negative for adenopathy. Does not bruise/bleed easily.  Psychiatric/Behavioral: Positive for sleep disturbance, dysphoric mood and decreased concentration. Negative for suicidal ideas, hallucinations, behavioral  problems, confusion, self-injury and agitation. The patient is nervous/anxious. The patient is not hyperactive.        Objective:   Physical Exam  Vitals reviewed. Constitutional: She is oriented to person, place, and time. She appears well-developed and well-nourished. No distress.  HENT:  Head: Normocephalic and atraumatic.  Mouth/Throat: Oropharynx is clear and moist. No oropharyngeal exudate.  Eyes: Conjunctivae are normal. Right eye exhibits no discharge. Left eye exhibits no discharge. No scleral icterus.  Neck: Normal range of motion. Neck supple. No JVD present. No tracheal deviation present. No thyromegaly present.  Cardiovascular: Normal rate, regular rhythm, normal heart sounds and intact distal pulses.  Exam reveals no gallop and no friction rub.   No murmur heard. Pulmonary/Chest: Effort normal and breath sounds normal. No stridor. No respiratory distress. She has no wheezes. She has no rales. She exhibits no tenderness.  Abdominal: Soft. Bowel sounds are normal. She exhibits no distension. There is no tenderness. There is no rebound and no guarding.  Musculoskeletal: Normal range of motion. She exhibits no edema and no tenderness.  Lymphadenopathy:    She has no cervical adenopathy.  Neurological: She is oriented to person, place, and time.  Skin: Skin is warm and dry. No rash noted. She is not diaphoretic. No erythema. No pallor.  Psychiatric: Her behavior is normal. Judgment and thought content normal. Her mood appears anxious. Her affect is not angry, not blunt, not labile and not inappropriate. Her speech is tangential. Her speech is not rapid and/or pressured, not delayed and not slurred. She is not agitated, not aggressive, is not hyperactive, not slowed, not withdrawn, not actively hallucinating and not combative. Thought content is not paranoid and not delusional. Cognition and memory are not impaired. She does not express impulsivity or inappropriate judgment. She does  not exhibit a depressed mood. She expresses no homicidal and no suicidal ideation. She expresses no suicidal plans and  no homicidal plans. She is communicative. She exhibits normal recent memory and normal remote memory. She is attentive.     Lab Results  Component Value Date   WBC 6.5 02/28/2012   HGB 13.8 02/28/2012   HCT 40.4 02/28/2012   PLT 307.0 02/28/2012   GLUCOSE 94 02/28/2012   CHOL 137 02/28/2012   TRIG 304.0* 02/28/2012   HDL 27.50* 02/28/2012   LDLDIRECT 65.0 02/28/2012   LDLCALC 122* 06/02/2009   ALT 16 02/28/2012   AST 21 02/28/2012   NA 135 02/28/2012   K 4.9 02/28/2012   CL 103 02/28/2012   CREATININE 0.8 02/28/2012   BUN 15 02/28/2012   CO2 28 02/28/2012   TSH 3.39 02/28/2012       Assessment & Plan:

## 2012-03-09 NOTE — Assessment & Plan Note (Signed)
Continue current meds for pain 

## 2012-03-09 NOTE — Assessment & Plan Note (Signed)
Her BP is well controlled 

## 2012-03-09 NOTE — Assessment & Plan Note (Signed)
She will see a nutritionist, exercise more, and take appetite suppressant as needed

## 2012-03-21 ENCOUNTER — Other Ambulatory Visit: Payer: Self-pay | Admitting: Internal Medicine

## 2012-03-26 ENCOUNTER — Telehealth: Payer: Self-pay | Admitting: Internal Medicine

## 2012-03-26 ENCOUNTER — Encounter: Payer: Self-pay | Admitting: Internal Medicine

## 2012-03-26 DIAGNOSIS — M8580 Other specified disorders of bone density and structure, unspecified site: Secondary | ICD-10-CM

## 2012-03-26 LAB — HM DEXA SCAN: HM Dexa Scan: -2

## 2012-03-26 MED ORDER — IBANDRONATE SODIUM 150 MG PO TABS: 150.0000 mg | ORAL_TABLET | ORAL | Status: DC

## 2012-03-26 NOTE — Telephone Encounter (Signed)
Pt says prescription that she don't know what it is for is too expensive.---Pt ph# [phone]--[facility] is asking for a call from this office.

## 2012-03-26 NOTE — Telephone Encounter (Signed)
It is for osteoporosis

## 2012-03-26 NOTE — Progress Notes (Signed)
Addended by: [name] on: 03/26/2012 12:21 PM   Modules accepted: Orders

## 2012-03-27 MED ORDER — ALENDRONATE SODIUM 70 MG PO TABS: 70.0000 mg | ORAL_TABLET | ORAL | Status: DC

## 2012-03-27 NOTE — Telephone Encounter (Signed)
Patient notified and states that she can not afford the $90 cost. She would like to know if there is something different. THanks

## 2012-03-27 NOTE — Telephone Encounter (Signed)
Patient notified

## 2012-03-27 NOTE — Telephone Encounter (Signed)
done

## 2012-04-02 ENCOUNTER — Telehealth: Payer: Self-pay

## 2012-04-02 NOTE — Telephone Encounter (Signed)
Patient called [facility] requesting a cortisone injection stating that her "bottom hurts" and havent been able to leave out the house over the weekend

## 2012-04-03 NOTE — Telephone Encounter (Signed)
Patient notified/[facility] 

## 2012-04-05 ENCOUNTER — Telehealth: Payer: Self-pay | Admitting: Internal Medicine

## 2012-04-05 NOTE — Telephone Encounter (Signed)
no

## 2012-04-05 NOTE — Telephone Encounter (Signed)
The patient called and is hoping to get a stronger pain med.  She states the pain med she is on now isn't strong enough for her symptoms.  Thanks!

## 2012-04-05 NOTE — Telephone Encounter (Signed)
Patient notified per MD.

## 2012-04-10 ENCOUNTER — Ambulatory Visit: Payer: Medicare PPO | Admitting: Internal Medicine

## 2012-04-10 DIAGNOSIS — Z0289 Encounter for other administrative examinations: Secondary | ICD-10-CM

## 2012-04-13 ENCOUNTER — Ambulatory Visit: Payer: Medicare PPO | Admitting: Internal Medicine

## 2012-04-13 DIAGNOSIS — Z0289 Encounter for other administrative examinations: Secondary | ICD-10-CM

## 2012-04-23 ENCOUNTER — Other Ambulatory Visit (INDEPENDENT_AMBULATORY_CARE_PROVIDER_SITE_OTHER): Payer: Medicare PPO

## 2012-04-23 ENCOUNTER — Telehealth: Payer: Self-pay | Admitting: Internal Medicine

## 2012-04-23 ENCOUNTER — Ambulatory Visit (INDEPENDENT_AMBULATORY_CARE_PROVIDER_SITE_OTHER): Payer: Medicare PPO | Admitting: Internal Medicine

## 2012-04-23 ENCOUNTER — Encounter: Payer: Self-pay | Admitting: Internal Medicine

## 2012-04-23 VITALS — BP 138/82 | HR 76 | Temp 98.0°F | Resp 16 | Wt 132.0 lb

## 2012-04-23 DIAGNOSIS — E785 Hyperlipidemia, unspecified: Secondary | ICD-10-CM

## 2012-04-23 DIAGNOSIS — M545 Low back pain, unspecified: Secondary | ICD-10-CM

## 2012-04-23 DIAGNOSIS — I10 Essential (primary) hypertension: Secondary | ICD-10-CM

## 2012-04-23 LAB — LIPID PANEL
Cholesterol: 175 mg/dL (ref 0–200)
HDL: 34.5 mg/dL — ABNORMAL LOW (ref >39.00)
LDL Cholesterol: 104 mg/dL — ABNORMAL HIGH (ref 0–99)
Total CHOL/HDL Ratio: 5
Triglycerides: 183 mg/dL — ABNORMAL HIGH (ref 0.0–149.0)
VLDL: 36.6 mg/dL (ref 0.0–40.0)

## 2012-04-23 NOTE — Assessment & Plan Note (Signed)
This is unchanged 

## 2012-04-23 NOTE — Assessment & Plan Note (Signed)
Her BP is well controlled 

## 2012-04-23 NOTE — Patient Instructions (Signed)
Hypertriglyceridemia  Diet for High blood levels of Triglycerides Most fats in food are triglycerides. Triglycerides in your blood are stored as fat in your body. High levels of triglycerides in your blood may put you at a greater risk for heart disease and stroke.  Normal triglyceride levels are less than 150 mg/dL. Borderline high levels are 150-199 mg/dl. High levels are 200 - 499 mg/dL, and very high triglyceride levels are greater than 500 mg/dL. The decision to treat high triglycerides is generally based on the level. For people with borderline or high triglyceride levels, treatment includes weight loss and exercise. Drugs are recommended for people with very high triglyceride levels. Many people who need treatment for high triglyceride levels have metabolic syndrome. This syndrome is a collection of disorders that often include: insulin resistance, high blood pressure, blood clotting problems, high cholesterol and triglycerides. TESTING PROCEDURE FOR TRIGLYCERIDES  You should not eat 4 hours before getting your triglycerides measured. The normal range of triglycerides is between 10 and 250 milligrams per deciliter (mg/dl). Some people may have extreme levels (1000 or above), but your triglyceride level may be too high if it is above 150 mg/dl, depending on what other risk factors you have for heart disease.   People with high blood triglycerides may also have high blood cholesterol levels. If you have high blood cholesterol as well as high blood triglycerides, your risk for heart disease is probably greater than if you only had high triglycerides. High blood cholesterol is one of the main risk factors for heart disease.  CHANGING YOUR DIET  Your weight can affect your blood triglyceride level. If you are more than 20% above your ideal body weight, you may be able to lower your blood triglycerides by losing weight. Eating less and exercising regularly is the best way to combat this. Fat provides  more calories than any other food. The best way to lose weight is to eat less fat. Only 30% of your total calories should come from fat. Less than 7% of your diet should come from saturated fat. A diet low in fat and saturated fat is the same as a diet to decrease blood cholesterol. By eating a diet lower in fat, you may lose weight, lower your blood cholesterol, and lower your blood triglyceride level.  Eating a diet low in fat, especially saturated fat, may also help you lower your blood triglyceride level. Ask your dietitian to help you figure how much fat you can eat based on the number of calories your caregiver has prescribed for you.  Exercise, in addition to helping with weight loss may also help lower triglyceride levels.   Alcohol can increase blood triglycerides. You may need to stop drinking alcoholic beverages.   Too much carbohydrate in your diet may also increase your blood triglycerides. Some complex carbohydrates are necessary in your diet. These may include bread, rice, potatoes, other starchy vegetables and cereals.   Reduce "simple" carbohydrates. These may include pure sugars, candy, honey, and jelly without losing other nutrients. If you have the kind of high blood triglycerides that is affected by the amount of carbohydrates in your diet, you will need to eat less sugar and less high-sugar foods. Your caregiver can help you with this.   Adding 2-4 grams of fish oil (EPA+ DHA) may also help lower triglycerides. Speak with your caregiver before adding any supplements to your regimen.  Following the Diet  Maintain your ideal weight. Your caregivers can help you with a diet. Generally,   eating less food and getting more exercise will help you lose weight. Joining a weight control group may also help. Ask your caregivers for a good weight control group in your area.  Eat low-fat foods instead of high-fat foods. This can help you lose weight too.  These foods are lower in fat. Eat MORE  of these:   Dried beans, peas, and lentils.   Egg whites.   Low-fat cottage cheese.   Fish.   Lean cuts of meat, such as round, sirloin, rump, and flank (cut extra fat off meat you fix).   Whole grain breads, cereals and pasta.   Skim and nonfat dry milk.   Low-fat yogurt.   Poultry without the skin.   Cheese made with skim or part-skim milk, such as mozzarella, parmesan, farmers', ricotta, or pot cheese.  These are higher fat foods. Eat LESS of these:   Whole milk and foods made from whole milk, such as American, blue, cheddar, monterey jack, and swiss cheese   High-fat meats, such as luncheon meats, sausages, knockwurst, bratwurst, hot dogs, ribs, corned beef, ground pork, and regular ground beef.   Fried foods.  Limit saturated fats in your diet. Substituting unsaturated fat for saturated fat may decrease your blood triglyceride level. You will need to read package labels to know which products contain saturated fats.  These foods are high in saturated fat. Eat LESS of these:   Fried pork skins.   Whole milk.   Skin and fat from poultry.   Palm oil.   Butter.   Shortening.   Cream cheese.   Bacon.   Margarines and baked goods made from listed oils.   Vegetable shortenings.   Chitterlings.   Fat from meats.   Coconut oil.   Palm kernel oil.   Lard.   Cream.   Sour cream.   Fatback.   Coffee whiteners and non-dairy creamers made with these oils.   Cheese made from whole milk.  Use unsaturated fats (both polyunsaturated and monounsaturated) moderately. Remember, even though unsaturated fats are better than saturated fats; you still want a diet low in total fat.  These foods are high in unsaturated fat:   Canola oil.   Sunflower oil.   Mayonnaise.   Almonds.   Peanuts.   Pine nuts.   Margarines made with these oils.   Safflower oil.   Olive oil.   Avocados.   Cashews.   Peanut butter.   Sunflower seeds.   Soybean oil.     Peanut oil.   Olives.   Pecans.   Walnuts.   Pumpkin seeds.  Avoid sugar and other high-sugar foods. This will decrease carbohydrates without decreasing other nutrients. Sugar in your food goes rapidly to your blood. When there is excess sugar in your blood, your liver may use it to make more triglycerides. Sugar also contains calories without other important nutrients.  Eat LESS of these:   Sugar, brown sugar, powdered sugar, jam, jelly, preserves, honey, syrup, molasses, pies, candy, cakes, cookies, frosting, pastries, colas, soft drinks, punches, fruit drinks, and regular gelatin.   Avoid alcohol. Alcohol, even more than sugar, may increase blood triglycerides. In addition, alcohol is high in calories and low in nutrients. Ask for sparkling water, or a diet soft drink instead of an alcoholic beverage.  Suggestions for planning and preparing meals   Bake, broil, grill or roast meats instead of frying.   Remove fat from meats and skin from poultry before cooking.   Add spices,   herbs, lemon juice or vinegar to vegetables instead of salt, rich sauces or gravies.   Use a non-stick skillet without fat or use no-stick sprays.   Cool and refrigerate stews and broth. Then remove the hardened fat floating on the surface before serving.   Refrigerate meat drippings and skim off fat to make low-fat gravies.   Serve more fish.   Use less butter, margarine and other high-fat spreads on bread or vegetables.   Use skim or reconstituted non-fat dry milk for cooking.   Cook with low-fat cheeses.   Substitute low-fat yogurt or cottage cheese for all or part of the sour cream in recipes for sauces, dips or congealed salads.   Use half yogurt/half mayonnaise in salad recipes.   Substitute evaporated skim milk for cream. Evaporated skim milk or reconstituted non-fat dry milk can be whipped and substituted for whipped cream in certain recipes.   Choose fresh fruits for dessert instead of  high-fat foods such as pies or cakes. Fruits are naturally low in fat.  When Dining Out   Order low-fat appetizers such as fruit or vegetable juice, pasta with vegetables or tomato sauce.   Select clear, rather than cream soups.   Ask that dressings and gravies be served on the side. Then use less of them.   Order foods that are baked, broiled, poached, steamed, stir-fried, or roasted.   Ask for margarine instead of butter, and use only a small amount.   Drink sparkling water, unsweetened tea or coffee, or diet soft drinks instead of alcohol or other sweet beverages.  QUESTIONS AND ANSWERS ABOUT OTHER FATS IN THE BLOOD: SATURATED FAT, TRANS FAT, AND CHOLESTEROL What is trans fat? Trans fat is a type of fat that is formed when vegetable oil is hardened through a process called hydrogenation. This process helps makes foods more solid, gives them shape, and prolongs their shelf life. Trans fats are also called hydrogenated or partially hydrogenated oils.  What do saturated fat, trans fat, and cholesterol in foods have to do with heart disease? Saturated fat, trans fat, and cholesterol in the diet all raise the level of LDL "bad" cholesterol in the blood. The higher the LDL cholesterol, the greater the risk for coronary heart disease (CHD). Saturated fat and trans fat raise LDL similarly.  What foods contain saturated fat, trans fat, and cholesterol? High amounts of saturated fat are found in animal products, such as fatty cuts of meat, chicken skin, and full-fat dairy products like butter, whole milk, cream, and cheese, and in tropical vegetable oils such as palm, palm kernel, and coconut oil. Trans fat is found in some of the same foods as saturated fat, such as vegetable shortening, some margarines (especially hard or stick margarine), crackers, cookies, baked goods, fried foods, salad dressings, and other processed foods made with partially hydrogenated vegetable oils. Small amounts of trans fat  also occur naturally in some animal products, such as milk products, beef, and lamb. Foods high in cholesterol include liver, other organ meats, egg yolks, shrimp, and full-fat dairy products. How can I use the new food label to make heart-healthy food choices? Check the Nutrition Facts panel of the food label. Choose foods lower in saturated fat, trans fat, and cholesterol. For saturated fat and cholesterol, you can also use the Percent Daily Value (%DV): 5% DV or less is low, and 20% DV or more is high. (There is no %DV for trans fat.) Use the Nutrition Facts panel to choose foods low in   saturated fat and cholesterol, and if the trans fat is not listed, read the ingredients and limit products that list shortening or hydrogenated or partially hydrogenated vegetable oil, which tend to be high in trans fat. POINTS TO REMEMBER: YOU NEED A LITTLE TLC (THERAPEUTIC LIFESTYLE CHANGES)  Discuss your risk for heart disease with your caregivers, and take steps to reduce risk factors.   Change your diet. Choose foods that are low in saturated fat, trans fat, and cholesterol.   Add exercise to your daily routine if it is not already being done. Participate in physical activity of moderate intensity, like brisk walking, for at least 30 minutes on most, and preferably all days of the week. No time? Break the 30 minutes into three, 10-minute segments during the day.   Stop smoking. If you do smoke, contact your caregiver to discuss ways in which they can help you quit.   Do not use street drugs.   Maintain a normal weight.   Maintain a healthy blood pressure.   Keep up with your blood work for checking the fats in your blood as directed by your caregiver.  Document Released: 09/29/2004 Document Revised: 12/01/2011 Document Reviewed: 04/27/2009 [employer] Patient Information 2012 [employer]. 

## 2012-04-23 NOTE — Assessment & Plan Note (Signed)
I will recheck her trigs level today

## 2012-04-23 NOTE — Telephone Encounter (Signed)
Pt is requesting refills on Phendimetrazine Tartrate.

## 2012-04-23 NOTE — Progress Notes (Signed)
Subjective:    Patient ID: Sherri Weber, female    DOB: [birth year], 78 y.o.   MRN: [id number]  Hyperlipidemia This is a chronic problem. The current episode started more than 1 year ago. The problem is uncontrolled. Recent lipid tests were reviewed and are variable. Exacerbating diseases include obesity. She has no history of chronic renal disease, diabetes, hypothyroidism, liver disease or nephrotic syndrome. Factors aggravating her hyperlipidemia include fatty foods. Pertinent negatives include no chest pain, focal sensory loss, focal weakness, leg pain, myalgias or shortness of breath. Current antihyperlipidemic treatment includes bile acid squestrants and fibric acid derivatives. The current treatment provides moderate improvement of lipids. Compliance problems include adherence to exercise and adherence to diet.       Review of Systems  Constitutional: Negative for fever, chills, diaphoresis, activity change, appetite change, fatigue and unexpected weight change.  HENT: Negative.   Eyes: Negative.   Respiratory: Negative for apnea, cough, chest tightness, shortness of breath, wheezing and stridor.   Cardiovascular: Negative for chest pain, palpitations and leg swelling.  Gastrointestinal: Positive for rectal pain. Negative for nausea, vomiting, abdominal pain, diarrhea, constipation, blood in stool, abdominal distention and anal bleeding.       She has persistent perianal pain and she tells me that she is seeing Dr. Shawna Clamp in [facility] and that a flex sig has been done, she does not know what the results were, she tells me that Dr. Lorri Frederick is doing a colonoscopy on 05/10, she is not seeing Dr. Kinnie Scales anymore but she continues Dr. Raelyn Ensign her surgeon in [place].  Genitourinary: Negative.   Musculoskeletal: Positive for back pain (chronic, unchanged). Negative for myalgias, joint swelling, arthralgias and gait problem.  Skin: Negative for color change, pallor, rash and wound.  Neurological:  Negative for dizziness, tremors, focal weakness, seizures, syncope, facial asymmetry, speech difficulty, weakness, light-headedness, numbness and headaches.  Hematological: Negative for adenopathy. Does not bruise/bleed easily.  Psychiatric/Behavioral: Positive for sleep disturbance, dysphoric mood and decreased concentration. Negative for suicidal ideas, hallucinations, behavioral problems, confusion, self-injury and agitation. The patient is nervous/anxious. The patient is not hyperactive.        Objective:   Physical Exam  Vitals reviewed. Constitutional: She is oriented to person, place, and time. She appears well-developed and well-nourished. No distress.  HENT:  Head: Normocephalic and atraumatic.  Mouth/Throat: Oropharynx is clear and moist. No oropharyngeal exudate.  Eyes: Conjunctivae are normal. Right eye exhibits no discharge. Left eye exhibits no discharge. No scleral icterus.  Neck: Normal range of motion. Neck supple. No JVD present. No tracheal deviation present. No thyromegaly present.  Cardiovascular: Normal rate, regular rhythm, normal heart sounds and intact distal pulses.  Exam reveals no gallop and no friction rub.   No murmur heard. Pulmonary/Chest: Effort normal and breath sounds normal. No stridor. No respiratory distress. She has no wheezes. She has no rales. She exhibits no tenderness.  Abdominal: Soft. Bowel sounds are normal. She exhibits no distension and no mass. There is no tenderness. There is no rebound and no guarding.  Musculoskeletal: Normal range of motion. She exhibits no edema and no tenderness.  Lymphadenopathy:    She has no cervical adenopathy.  Neurological: She is oriented to person, place, and time.  Skin: Skin is warm and dry. No rash noted. She is not diaphoretic. No erythema. No pallor.  Psychiatric: She has a normal mood and affect. Her behavior is normal. Judgment and thought content normal.      Lab Results  Component Value  Date   WBC  6.5 02/28/2012   HGB 13.8 02/28/2012   HCT 40.4 02/28/2012   PLT 307.0 02/28/2012   GLUCOSE 94 02/28/2012   CHOL 137 02/28/2012   TRIG 304.0* 02/28/2012   HDL 27.50* 02/28/2012   LDLDIRECT 65.0 02/28/2012   LDLCALC 122* 06/02/2009   ALT 16 02/28/2012   AST 21 02/28/2012   NA 135 02/28/2012   K 4.9 02/28/2012   CL 103 02/28/2012   CREATININE 0.8 02/28/2012   BUN 15 02/28/2012   CO2 28 02/28/2012   TSH 3.39 02/28/2012      Assessment & Plan:

## 2012-04-24 ENCOUNTER — Telehealth: Payer: Self-pay | Admitting: *Deleted

## 2012-04-24 NOTE — Telephone Encounter (Signed)
Left msg on vm requesting to speak with md. Currently in [place] but coming to [place] to visit mom not been doing well. Called daughter back she wanting md to do additional testing due to mom chronic pain. Did inform daughter mom had ov yesterday & she would need to speak with mother on what md advise her to do. All the other concerns that she has the best advise is when she get here to make a appt with md with mom that way she can give him her concern.... 04/24/12@10 :58am/LMB

## 2012-04-25 ENCOUNTER — Telehealth: Payer: Self-pay

## 2012-04-25 NOTE — Telephone Encounter (Signed)
Error

## 2012-04-26 ENCOUNTER — Ambulatory Visit: Payer: Medicare PPO | Admitting: Internal Medicine

## 2012-05-01 ENCOUNTER — Other Ambulatory Visit: Payer: Self-pay | Admitting: Internal Medicine

## 2012-05-01 DIAGNOSIS — E669 Obesity, unspecified: Secondary | ICD-10-CM

## 2012-05-01 MED ORDER — PHENDIMETRAZINE TARTRATE ER 105 MG PO CP24: 1.0000 | ORAL_CAPSULE | Freq: Every day | ORAL | Status: DC

## 2012-05-11 ENCOUNTER — Telehealth: Payer: Self-pay

## 2012-05-11 NOTE — Telephone Encounter (Signed)
Pt's daughter called stating pt saw Dr Harold Hedge GYN on 05/10/2012 and Dx pt with pelvic floor prolapse with detachment. Pt was recommended for colonoscopy to R/O other problems.

## 2012-05-16 ENCOUNTER — Telehealth: Payer: Self-pay

## 2012-05-16 DIAGNOSIS — K219 Gastro-esophageal reflux disease without esophagitis: Secondary | ICD-10-CM

## 2012-05-16 DIAGNOSIS — K920 Hematemesis: Secondary | ICD-10-CM

## 2012-05-16 MED ORDER — PROMETHAZINE HCL 12.5 MG PO TABS: 12.5000 mg | ORAL_TABLET | Freq: Four times a day (QID) | ORAL | Status: DC | PRN

## 2012-05-16 NOTE — Telephone Encounter (Signed)
Called insurance [employer] to obtain fax to proceed with PA. Fax received and pending

## 2012-05-16 NOTE — Telephone Encounter (Signed)
Pt's daughter called stating a PA is required on pt's Zolpidem. Call [phone] ID [id number].

## 2012-05-17 NOTE — Telephone Encounter (Signed)
Form completed and faxed to insurance company.

## 2012-05-17 NOTE — Telephone Encounter (Signed)
Zolpidem 10mg  denied, pharmacy notified

## 2012-06-05 ENCOUNTER — Ambulatory Visit (INDEPENDENT_AMBULATORY_CARE_PROVIDER_SITE_OTHER): Payer: Medicare PPO | Admitting: Internal Medicine

## 2012-06-05 ENCOUNTER — Ambulatory Visit (INDEPENDENT_AMBULATORY_CARE_PROVIDER_SITE_OTHER)
Admission: RE | Admit: 2012-06-05 | Discharge: 2012-06-05 | Disposition: A | Discharge status: Home / Self Care | Payer: Medicare PPO | Source: Ambulatory Visit | Attending: Internal Medicine | Admitting: Internal Medicine

## 2012-06-05 ENCOUNTER — Encounter: Payer: Self-pay | Admitting: Internal Medicine

## 2012-06-05 ENCOUNTER — Other Ambulatory Visit (INDEPENDENT_AMBULATORY_CARE_PROVIDER_SITE_OTHER): Payer: Medicare PPO

## 2012-06-05 VITALS — BP 120/62 | HR 76 | Temp 98.8°F | Resp 16 | Wt 133.0 lb

## 2012-06-05 DIAGNOSIS — M7989 Other specified soft tissue disorders: Secondary | ICD-10-CM

## 2012-06-05 DIAGNOSIS — I10 Essential (primary) hypertension: Secondary | ICD-10-CM

## 2012-06-05 DIAGNOSIS — M79672 Pain in left foot: Secondary | ICD-10-CM

## 2012-06-05 DIAGNOSIS — M79609 Pain in unspecified limb: Secondary | ICD-10-CM

## 2012-06-05 LAB — COMPREHENSIVE METABOLIC PANEL
ALT: 15 U/L (ref 0–35)
AST: 21 U/L (ref 0–37)
Albumin: 3.8 g/dL (ref 3.5–5.2)
Alkaline Phosphatase: 59 U/L (ref 39–117)
BUN: 15 mg/dL (ref 6–23)
CO2: 27 mEq/L (ref 19–32)
Calcium: 9.1 mg/dL (ref 8.4–10.5)
Chloride: 108 mEq/L (ref 96–112)
Creatinine, Ser: 1 mg/dL (ref 0.4–1.2)
GFR: 59.89 mL/min — ABNORMAL LOW (ref >60.00)
Glucose, Bld: 86 mg/dL (ref 70–99)
Potassium: 4.6 mEq/L (ref 3.5–5.1)
Sodium: 144 mEq/L (ref 135–145)
Total Bilirubin: 0.3 mg/dL (ref 0.3–1.2)
Total Protein: 6.9 g/dL (ref 6.0–8.3)

## 2012-06-05 LAB — TSH: TSH: 2.23 u[IU]/mL (ref 0.35–5.50)

## 2012-06-05 NOTE — Assessment & Plan Note (Signed)
Her BP is well controlled, I will check her lytes and renal function 

## 2012-06-05 NOTE — Assessment & Plan Note (Signed)
I will check a plain film to see if there is a boney derangement or lytic lesion

## 2012-06-05 NOTE — Progress Notes (Signed)
Subjective:    Patient ID: Sherri Weber, female    DOB: 1933-06-09, 78 y.o.   MRN: [id number]  HPI She returns c/o swelling and the feeling of tightness in her left foot for 4 days, she does not recall any injury or trauma. There is minimal swelling in her left lower leg and right lower leg. There are no new meds or other precipitating factors.   Review of Systems  Constitutional: Negative.   HENT: Negative.   Eyes: Negative.   Respiratory: Negative.   Cardiovascular: Positive for leg swelling. Negative for chest pain and palpitations.  Gastrointestinal: Negative.   Genitourinary: Negative.   Musculoskeletal: Negative for myalgias, back pain, joint swelling, arthralgias and gait problem.  Skin: Negative.   Neurological: Negative.   Hematological: Negative for adenopathy. Does not bruise/bleed easily.  Psychiatric/Behavioral: Negative.        Objective:   Physical Exam  Vitals reviewed. Constitutional: She is oriented to person, place, and time. She appears well-developed and well-nourished. No distress.  HENT:  Head: Normocephalic and atraumatic.  Mouth/Throat: Oropharynx is clear and moist. No oropharyngeal exudate.  Eyes: Conjunctivae are normal. Right eye exhibits no discharge. Left eye exhibits no discharge. No scleral icterus.  Neck: Normal range of motion. Neck supple. No JVD present. No tracheal deviation present. No thyromegaly present.  Cardiovascular: Normal rate, regular rhythm, normal heart sounds and intact distal pulses.  Exam reveals no gallop and no friction rub.   No murmur heard. Pulses:      Carotid pulses are 1+ on the right side, and 1+ on the left side.      Radial pulses are 1+ on the right side, and 1+ on the left side.       Femoral pulses are 1+ on the right side, and 1+ on the left side.      Popliteal pulses are 1+ on the right side, and 1+ on the left side.       Dorsalis pedis pulses are 1+ on the right side, and 1+ on the left side.   Posterior tibial pulses are 1+ on the right side, and 1+ on the left side.  Pulmonary/Chest: Effort normal and breath sounds normal. No stridor. No respiratory distress. She has no wheezes. She has no rales. She exhibits no tenderness.  Abdominal: Soft. Bowel sounds are normal. She exhibits no distension and no mass. There is no tenderness. There is no rebound and no guarding.  Musculoskeletal: Normal range of motion. She exhibits edema (trace edema in BLE). She exhibits no tenderness.       Left ankle: She exhibits swelling (left foot shows 3+ pitting edema). She exhibits normal range of motion, no ecchymosis, no deformity, no laceration and normal pulse. no tenderness.  Lymphadenopathy:    She has no cervical adenopathy.  Neurological: She is oriented to person, place, and time.  Skin: Skin is warm and dry. No rash noted. She is not diaphoretic. No erythema. No pallor.  Psychiatric: She has a normal mood and affect. Her behavior is normal. Judgment and thought content normal.      Lab Results  Component Value Date   WBC 6.5 02/28/2012   HGB 13.8 02/28/2012   HCT 40.4 02/28/2012   PLT 307.0 02/28/2012   GLUCOSE 94 02/28/2012   CHOL 175 04/23/2012   TRIG 183.0* 04/23/2012   HDL 34.50* 04/23/2012   LDLDIRECT 65.0 02/28/2012   LDLCALC 104* 04/23/2012   ALT 16 02/28/2012   AST 21 02/28/2012  NA 135 02/28/2012   K 4.9 02/28/2012   CL 103 02/28/2012   CREATININE 0.8 02/28/2012   BUN 15 02/28/2012   CO2 28 02/28/2012   TSH 3.39 02/28/2012      Assessment & Plan:

## 2012-06-05 NOTE — Patient Instructions (Signed)
Edema Edema is an abnormal build-up of fluids in tissues. Because this is partly dependent on gravity (water flows to the lowest place), it is more common in the leg sand thighs (lower extremities). It is also common in the looser tissues, like around the eyes. Painless swelling of the feet and ankles is common and increases as a person ages. It may affect both legs and may include the calves or even thighs. When squeezed, the fluid may move out of the affected area and may leave a dent for a few moments. CAUSES   Prolonged standing or sitting in one place for extended periods of time. Movement helps pump tissue fluid into the veins, and absence of movement prevents this, resulting in edema.   Varicose veins. The valves in the veins do not work as well as they should. This causes fluid to leak into the tissues.   Fluid and salt overload.   Injury, burn, or surgery to the leg, ankle, or foot, may damage veins and allow fluid to leak out.   Sunburn damages vessels. Leaky vessels allow fluid to go out into the sunburned tissues.   Allergies (from insect bites or stings, medications or chemicals) cause swelling by allowing vessels to become leaky.   Protein in the blood helps keep fluid in your vessels. Low protein, as in malnutrition, allows fluid to leak out.   Hormonal changes, including pregnancy and menstruation, cause fluid retention. This fluid may leak out of vessels and cause edema.   Medications that cause fluid retention. Examples are sex hormones, blood pressure medications, steroid treatment, or anti-depressants.   Some illnesses cause edema, especially heart failure, kidney disease, or liver disease.   Surgery that cuts veins or lymph nodes, such as surgery done for the heart or for breast cancer, may result in edema.  DIAGNOSIS  Your caregiver is usually easily able to determine what is causing your swelling (edema) by simply asking what is wrong (getting a history) and examining  you (doing a physical). Sometimes x-rays, EKG (electrocardiogram or heart tracing), and blood work may be done to evaluate for underlying medical illness. TREATMENT  General treatment includes:  Leg elevation (or elevation of the affected body part).   Restriction of fluid intake.   Prevention of fluid overload.   Compression of the affected body part. Compression with elastic bandages or support stockings squeezes the tissues, preventing fluid from entering and forcing it back into the blood vessels.   Diuretics (also called water pills or fluid pills) pull fluid out of your body in the form of increased urination. These are effective in reducing the swelling, but can have side effects and must be used only under your caregiver's supervision. Diuretics are appropriate only for some types of edema.  The specific treatment can be directed at any underlying causes discovered. Heart, liver, or kidney disease should be treated appropriately. HOME CARE INSTRUCTIONS   Elevate the legs (or affected body part) above the level of the heart, while lying down.   Avoid sitting or standing still for prolonged periods of time.   Avoid putting anything directly under the knees when lying down, and do not wear constricting clothing or garters on the upper legs.   Exercising the legs causes the fluid to work back into the veins and lymphatic channels. This may help the swelling go down.   The pressure applied by elastic bandages or support stockings can help reduce ankle swelling.   A low-salt diet may help reduce fluid   retention and decrease the ankle swelling.   Take any medications exactly as prescribed.  SEEK MEDICAL CARE IF:  Your edema is not responding to recommended treatments. SEEK IMMEDIATE MEDICAL CARE IF:   You develop shortness of breath or chest pain.   You cannot breathe when you lay down; or if, while lying down, you have to get up and go to the window to get your breath.   You  are having increasing swelling without relief from treatment.   You develop a fever over 102 F (38.9 C).   You develop pain or redness in the areas that are swollen.   Tell your caregiver right away if you have gained 3 lb/1.4 kg in 1 day or 5 lb/2.3 kg in a week.  MAKE SURE YOU:   Understand these instructions.   Will watch your condition.   Will get help right away if you are not doing well or get worse.  Document Released: 12/12/2005 Document Revised: 12/01/2011 Document Reviewed: 07/30/2008 [employer] Patient Information 2012 [employer]. 

## 2012-06-05 NOTE — Assessment & Plan Note (Signed)
The swelling is very unusual, it is quite localized, the skin is normal with no signs of infection/bite/or injury, I will check labs to look for renal failure and thyroid disease as well as check a d-dimer to look for dvt, I have also asked her to get an u/s done to look for clot

## 2012-06-06 ENCOUNTER — Encounter (INDEPENDENT_AMBULATORY_CARE_PROVIDER_SITE_OTHER): Payer: Medicare PPO

## 2012-06-06 DIAGNOSIS — M7989 Other specified soft tissue disorders: Secondary | ICD-10-CM

## 2012-06-06 DIAGNOSIS — M79672 Pain in left foot: Secondary | ICD-10-CM

## 2012-06-06 LAB — D-DIMER, QUANTITATIVE (NOT AT ARMC): D-Dimer, Quant: 0.27 ug/mL-FEU (ref 0.00–0.48)

## 2012-06-07 ENCOUNTER — Telehealth: Payer: Self-pay

## 2012-06-07 NOTE — Telephone Encounter (Signed)
Everything was normal, put an ACE on it, elevate it today and let me recheck it tomorrow

## 2012-06-07 NOTE — Telephone Encounter (Signed)
Pt called stating her ankle and leg are still swollen and she is concerned. Pt received results of U/S - normal but has not get results of labs or X-ray. Please advise pt on result and follow up.

## 2012-06-07 NOTE — Telephone Encounter (Signed)
Pt advised and transferred to schedulers.

## 2012-06-08 ENCOUNTER — Ambulatory Visit (INDEPENDENT_AMBULATORY_CARE_PROVIDER_SITE_OTHER): Payer: Medicare PPO | Admitting: Internal Medicine

## 2012-06-08 ENCOUNTER — Encounter: Payer: Self-pay | Admitting: Internal Medicine

## 2012-06-08 ENCOUNTER — Ambulatory Visit: Payer: Medicare PPO | Admitting: Internal Medicine

## 2012-06-08 VITALS — BP 130/78 | HR 64 | Temp 98.5°F | Resp 16 | Wt 134.0 lb

## 2012-06-08 DIAGNOSIS — I10 Essential (primary) hypertension: Secondary | ICD-10-CM

## 2012-06-08 DIAGNOSIS — R609 Edema, unspecified: Secondary | ICD-10-CM | POA: Insufficient documentation

## 2012-06-08 MED ORDER — HYDROCHLOROTHIAZIDE 12.5 MG PO TABS: 12.5000 mg | ORAL_TABLET | Freq: Every day | ORAL | Status: DC

## 2012-06-08 NOTE — Assessment & Plan Note (Signed)
Start HCTZ 

## 2012-06-08 NOTE — Assessment & Plan Note (Signed)
Screening for dvt, chf, metabolic diseases has been normal so I think this is edema associated with lifestyle (high sodium intake) and hypertension, her EKG does not show any signs of LVH, MI, tachycardia, I will start HCTZ to reduce the edema

## 2012-06-08 NOTE — Patient Instructions (Signed)
Edema Edema is an abnormal build-up of fluids in tissues. Because this is partly dependent on gravity (water flows to the lowest place), it is more common in the leg sand thighs (lower extremities). It is also common in the looser tissues, like around the eyes. Painless swelling of the feet and ankles is common and increases as a person ages. It may affect both legs and may include the calves or even thighs. When squeezed, the fluid may move out of the affected area and may leave a dent for a few moments. CAUSES   Prolonged standing or sitting in one place for extended periods of time. Movement helps pump tissue fluid into the veins, and absence of movement prevents this, resulting in edema.   Varicose veins. The valves in the veins do not work as well as they should. This causes fluid to leak into the tissues.   Fluid and salt overload.   Injury, burn, or surgery to the leg, ankle, or foot, may damage veins and allow fluid to leak out.   Sunburn damages vessels. Leaky vessels allow fluid to go out into the sunburned tissues.   Allergies (from insect bites or stings, medications or chemicals) cause swelling by allowing vessels to become leaky.   Protein in the blood helps keep fluid in your vessels. Low protein, as in malnutrition, allows fluid to leak out.   Hormonal changes, including pregnancy and menstruation, cause fluid retention. This fluid may leak out of vessels and cause edema.   Medications that cause fluid retention. Examples are sex hormones, blood pressure medications, steroid treatment, or anti-depressants.   Some illnesses cause edema, especially heart failure, kidney disease, or liver disease.   Surgery that cuts veins or lymph nodes, such as surgery done for the heart or for breast cancer, may result in edema.  DIAGNOSIS  Your caregiver is usually easily able to determine what is causing your swelling (edema) by simply asking what is wrong (getting a history) and examining  you (doing a physical). Sometimes x-rays, EKG (electrocardiogram or heart tracing), and blood work may be done to evaluate for underlying medical illness. TREATMENT  General treatment includes:  Leg elevation (or elevation of the affected body part).   Restriction of fluid intake.   Prevention of fluid overload.   Compression of the affected body part. Compression with elastic bandages or support stockings squeezes the tissues, preventing fluid from entering and forcing it back into the blood vessels.   Diuretics (also called water pills or fluid pills) pull fluid out of your body in the form of increased urination. These are effective in reducing the swelling, but can have side effects and must be used only under your caregiver's supervision. Diuretics are appropriate only for some types of edema.  The specific treatment can be directed at any underlying causes discovered. Heart, liver, or kidney disease should be treated appropriately. HOME CARE INSTRUCTIONS   Elevate the legs (or affected body part) above the level of the heart, while lying down.   Avoid sitting or standing still for prolonged periods of time.   Avoid putting anything directly under the knees when lying down, and do not wear constricting clothing or garters on the upper legs.   Exercising the legs causes the fluid to work back into the veins and lymphatic channels. This may help the swelling go down.   The pressure applied by elastic bandages or support stockings can help reduce ankle swelling.   A low-salt diet may help reduce fluid   retention and decrease the ankle swelling.   Take any medications exactly as prescribed.  SEEK MEDICAL CARE IF:  Your edema is not responding to recommended treatments. SEEK IMMEDIATE MEDICAL CARE IF:   You develop shortness of breath or chest pain.   You cannot breathe when you lay down; or if, while lying down, you have to get up and go to the window to get your breath.   You  are having increasing swelling without relief from treatment.   You develop a fever over 102 F (38.9 C).   You develop pain or redness in the areas that are swollen.   Tell your caregiver right away if you have gained 3 lb/1.4 kg in 1 day or 5 lb/2.3 kg in a week.  MAKE SURE YOU:   Understand these instructions.   Will watch your condition.   Will get help right away if you are not doing well or get worse.  Document Released: 12/12/2005 Document Revised: 12/01/2011 Document Reviewed: 07/30/2008 [employer] Patient Information 2012 [employer]. 

## 2012-06-08 NOTE — Progress Notes (Signed)
Subjective:    Patient ID: Sherri Weber, female    DOB: Jul 08, 1933, 78 y.o.   MRN: [id number]  Hypertension This is a chronic problem. The current episode started more than 1 year ago. The problem has been gradually worsening since onset. The problem is uncontrolled. Associated symptoms include peripheral edema. Pertinent negatives include no anxiety, blurred vision, chest pain, headaches, malaise/fatigue, neck pain, orthopnea, palpitations, PND, shortness of breath or sweats. Past treatments include nothing. The current treatment provides no improvement. Compliance problems include exercise and diet.       Review of Systems  Constitutional: Negative for fever, chills, malaise/fatigue, diaphoresis, activity change, appetite change, fatigue and unexpected weight change.  HENT: Negative.  Negative for neck pain.   Eyes: Negative.  Negative for blurred vision.  Respiratory: Negative for apnea, cough, choking, chest tightness, shortness of breath, wheezing and stridor.   Cardiovascular: Positive for leg swelling. Negative for chest pain, palpitations, orthopnea and PND.  Gastrointestinal: Negative.   Genitourinary: Negative.   Musculoskeletal: Negative.   Skin: Negative.   Neurological: Negative.  Negative for dizziness, tremors, seizures, syncope, facial asymmetry, speech difficulty, weakness, light-headedness, numbness and headaches.  Hematological: Negative for adenopathy. Does not bruise/bleed easily.  Psychiatric/Behavioral: Negative.        Objective:   Physical Exam  Vitals reviewed. Constitutional: She is oriented to person, place, and time. She appears well-developed and well-nourished. No distress.  HENT:  Head: Normocephalic and atraumatic.  Mouth/Throat: Oropharynx is clear and moist. No oropharyngeal exudate.  Eyes: Conjunctivae are normal. Right eye exhibits no discharge. Left eye exhibits no discharge. No scleral icterus.  Neck: Normal range of motion. Neck supple.  No JVD present. No tracheal deviation present. No thyromegaly present.  Cardiovascular: Normal rate, regular rhythm, S1 normal, S2 normal, normal heart sounds and intact distal pulses.  Exam reveals no gallop, no S3, no S4 and no friction rub.   No murmur heard.  No systolic murmur is present   No diastolic murmur is present  Pulses:      Carotid pulses are 2+ on the right side, and 2+ on the left side.      Radial pulses are 2+ on the right side, and 2+ on the left side.       Femoral pulses are 2+ on the right side, and 2+ on the left side.      Popliteal pulses are 2+ on the right side, and 2+ on the left side.       Dorsalis pedis pulses are 2+ on the right side, and 2+ on the left side.       Posterior tibial pulses are 2+ on the right side, and 2+ on the left side.  Pulmonary/Chest: Effort normal and breath sounds normal. No stridor. No respiratory distress. She has no wheezes. She has no rales. She exhibits no tenderness.  Abdominal: Soft. Bowel sounds are normal. She exhibits no distension and no mass. There is no tenderness. There is no rebound and no guarding.  Musculoskeletal: Normal range of motion. She exhibits edema (she has 1-2 + pitting edema in BLE). She exhibits no tenderness.  Lymphadenopathy:    She has no cervical adenopathy.  Neurological: She is oriented to person, place, and time.  Skin: Skin is warm and dry. No rash noted. She is not diaphoretic. No erythema. No pallor.  Psychiatric: She has a normal mood and affect. Her behavior is normal. Judgment and thought content normal.     Lab Results  Component  Value Date   WBC 6.5 02/28/2012   HGB 13.8 02/28/2012   HCT 40.4 02/28/2012   PLT 307.0 02/28/2012   GLUCOSE 86 06/05/2012   CHOL 175 04/23/2012   TRIG 183.0* 04/23/2012   HDL 34.50* 04/23/2012   LDLDIRECT 65.0 02/28/2012   LDLCALC 104* 04/23/2012   ALT 15 06/05/2012   AST 21 06/05/2012   NA 144 06/05/2012   K 4.6 06/05/2012   CL 108 06/05/2012   CREATININE 1.0 06/05/2012     BUN 15 06/05/2012   CO2 27 06/05/2012   TSH 2.23 06/05/2012       Assessment & Plan:

## 2012-06-11 ENCOUNTER — Telehealth: Payer: Self-pay | Admitting: Internal Medicine

## 2012-06-11 NOTE — Telephone Encounter (Signed)
Caller: Sui/Patient; PCP: Sanda Linger; CB#: [phone];  Call regarding Nausea on and off for past 6 months.Vomiting (yellow, Bile) onset 06/10/12 and Rectal Pain onset 05/2011. She has Rectal Prolapse and trouble with Constipation. Appnt with Gastroenterology on 06/18/12. She is only passing small amounts of mushy stool. Appetite diminished. She is taking sips of Coke and drinking water. She Self-catheterizes- urine clear. She has vomited  5-6 x times today. Taking Promethazine for nausea and is not helping. Triage and Care advice per Nausea and Constipation Protocol and appnt advised within 3 days for pain or feeling of pressure with bowel movement and Loss of appetite for 3 or more days or nausea for 7 days or more. Appnt scheduled for 06/13/12 at 1015.

## 2012-06-13 ENCOUNTER — Ambulatory Visit: Payer: Medicare PPO | Admitting: Internal Medicine

## 2012-07-16 ENCOUNTER — Telehealth: Payer: Self-pay

## 2012-07-16 NOTE — Telephone Encounter (Signed)
Colostomy per Home health nurse

## 2012-07-16 NOTE — Telephone Encounter (Signed)
Ok, but what procedure?

## 2012-07-16 NOTE — Telephone Encounter (Signed)
Home health called reporting that pt has a bad case of diarrhea since procedure. She is requesting orders for Immodium AD. Thanks

## 2012-07-23 ENCOUNTER — Telehealth: Payer: Self-pay | Admitting: Internal Medicine

## 2012-07-23 NOTE — Telephone Encounter (Signed)
Pt is calling to request something stronger than hydrocodone.  She wants something to perk her up.  She also says she is weak after [place].

## 2012-07-31 NOTE — Telephone Encounter (Signed)
Has this been addressed?

## 2012-07-31 NOTE — Telephone Encounter (Signed)
I don't prescribe pain meds to "perk people up", so the answer is no

## 2012-08-01 NOTE — Telephone Encounter (Signed)
Patient notified/[facility] 

## 2012-08-30 ENCOUNTER — Encounter: Payer: Self-pay | Admitting: Internal Medicine

## 2012-08-30 ENCOUNTER — Ambulatory Visit (INDEPENDENT_AMBULATORY_CARE_PROVIDER_SITE_OTHER): Payer: Medicare PPO | Admitting: Internal Medicine

## 2012-08-30 VITALS — BP 130/82 | HR 99 | Temp 97.1°F | Resp 16 | Wt 126.0 lb

## 2012-08-30 DIAGNOSIS — R609 Edema, unspecified: Secondary | ICD-10-CM

## 2012-08-30 DIAGNOSIS — G4700 Insomnia, unspecified: Secondary | ICD-10-CM

## 2012-08-30 DIAGNOSIS — K920 Hematemesis: Secondary | ICD-10-CM

## 2012-08-30 DIAGNOSIS — M1990 Unspecified osteoarthritis, unspecified site: Secondary | ICD-10-CM

## 2012-08-30 DIAGNOSIS — M545 Low back pain, unspecified: Secondary | ICD-10-CM

## 2012-08-30 DIAGNOSIS — I10 Essential (primary) hypertension: Secondary | ICD-10-CM

## 2012-08-30 DIAGNOSIS — K219 Gastro-esophageal reflux disease without esophagitis: Secondary | ICD-10-CM

## 2012-08-30 DIAGNOSIS — E785 Hyperlipidemia, unspecified: Secondary | ICD-10-CM

## 2012-08-30 DIAGNOSIS — F329 Major depressive disorder, single episode, unspecified: Secondary | ICD-10-CM

## 2012-08-30 DIAGNOSIS — E669 Obesity, unspecified: Secondary | ICD-10-CM

## 2012-08-30 DIAGNOSIS — F3289 Other specified depressive episodes: Secondary | ICD-10-CM

## 2012-08-30 MED ORDER — ALPRAZOLAM 2 MG PO TABS: 2.0000 mg | ORAL_TABLET | Freq: Three times a day (TID) | ORAL | Status: DC | PRN

## 2012-08-30 MED ORDER — PHENDIMETRAZINE TARTRATE ER 105 MG PO CP24: 1.0000 | ORAL_CAPSULE | Freq: Every day | ORAL | Status: DC

## 2012-08-30 MED ORDER — HYDROCHLOROTHIAZIDE 12.5 MG PO TABS: 12.5000 mg | ORAL_TABLET | Freq: Every day | ORAL | Status: DC

## 2012-08-30 MED ORDER — COLESTIPOL HCL 1 G PO TABS: 1.0000 g | ORAL_TABLET | Freq: Two times a day (BID) | ORAL | Status: DC

## 2012-08-30 MED ORDER — ESCITALOPRAM OXALATE 10 MG PO TABS: 10.0000 mg | ORAL_TABLET | Freq: Every day | ORAL | Status: DC

## 2012-08-30 MED ORDER — CHOLINE FENOFIBRATE 135 MG PO CPDR: 135.0000 mg | DELAYED_RELEASE_CAPSULE | Freq: Every day | ORAL | Status: DC

## 2012-08-30 MED ORDER — DEXLANSOPRAZOLE 60 MG PO CPDR: 60.0000 mg | DELAYED_RELEASE_CAPSULE | Freq: Every day | ORAL | Status: DC

## 2012-08-30 MED ORDER — HYDROCODONE-ACETAMINOPHEN 5-500 MG PO TABS: 1.0000 | ORAL_TABLET | Freq: Four times a day (QID) | ORAL | Status: DC | PRN

## 2012-08-30 MED ORDER — ZOLPIDEM TARTRATE 10 MG PO TABS: 10.0000 mg | ORAL_TABLET | Freq: Every evening | ORAL | Status: DC | PRN

## 2012-08-30 MED ORDER — CYCLOBENZAPRINE HCL 10 MG PO TABS: 10.0000 mg | ORAL_TABLET | Freq: Two times a day (BID) | ORAL | Status: DC | PRN

## 2012-08-30 NOTE — Progress Notes (Signed)
  Subjective:    Patient ID: Sherri Weber, female    DOB: September 24, 1933, 78 y.o.   MRN: [id number]  Arthritis Presents for follow-up visit. She complains of pain. She reports no stiffness, joint swelling or joint warmth. Affected locations include the left knee and right knee. Her pain is at a severity of 2/10. Pertinent negatives include no diarrhea, dysuria, fatigue, fever, pain at night, pain while resting, rash or weight loss. Compliance with total regimen is 51-75%.      Review of Systems  Constitutional: Negative for fever, chills, weight loss, diaphoresis, activity change, appetite change, fatigue and unexpected weight change.  HENT: Negative.   Eyes: Negative.   Respiratory: Negative for cough, chest tightness, shortness of breath, wheezing and stridor.   Cardiovascular: Negative for chest pain, palpitations and leg swelling.  Gastrointestinal: Negative for abdominal pain, diarrhea, constipation, blood in stool and rectal pain.  Genitourinary: Negative.  Negative for dysuria.  Musculoskeletal: Positive for back pain, arthralgias and arthritis. Negative for myalgias, joint swelling, gait problem and stiffness.  Skin: Negative for color change, pallor, rash and wound.  Neurological: Negative for dizziness, tremors, seizures, syncope, facial asymmetry, speech difficulty, weakness, light-headedness, numbness and headaches.  Psychiatric/Behavioral: Positive for disturbed wake/sleep cycle and dysphoric mood. Negative for suicidal ideas, hallucinations, behavioral problems, confusion, self-injury, decreased concentration and agitation. The patient is nervous/anxious. The patient is not hyperactive.        Objective:   Physical Exam  Vitals reviewed. Constitutional: She is oriented to person, place, and time. She appears well-developed and well-nourished. No distress.  HENT:  Head: Normocephalic and atraumatic.  Mouth/Throat: Oropharynx is clear and moist. No oropharyngeal exudate.    Eyes: Conjunctivae are normal. Right eye exhibits no discharge. Left eye exhibits no discharge. No scleral icterus.  Neck: Normal range of motion. Neck supple. No JVD present. No tracheal deviation present. No thyromegaly present.  Cardiovascular: Normal rate, regular rhythm, normal heart sounds and intact distal pulses.  Exam reveals no gallop and no friction rub.   No murmur heard. Pulmonary/Chest: Effort normal and breath sounds normal. No stridor. No respiratory distress. She has no wheezes. She has no rales. She exhibits no tenderness.  Abdominal: Soft. Bowel sounds are normal. She exhibits no distension and no mass. There is no tenderness. There is no rebound and no guarding.  Musculoskeletal: Normal range of motion. She exhibits no edema and no tenderness.  Lymphadenopathy:    She has no cervical adenopathy.  Neurological: She is oriented to person, place, and time.  Skin: Skin is warm and dry. No rash noted. She is not diaphoretic. No erythema. No pallor.  Psychiatric: She has a normal mood and affect. Her behavior is normal. Judgment and thought content normal.      Lab Results  Component Value Date   WBC 6.5 02/28/2012   HGB 13.8 02/28/2012   HCT 40.4 02/28/2012   PLT 307.0 02/28/2012   GLUCOSE 86 06/05/2012   CHOL 175 04/23/2012   TRIG 183.0* 04/23/2012   HDL 34.50* 04/23/2012   LDLDIRECT 65.0 02/28/2012   LDLCALC 104* 04/23/2012   ALT 15 06/05/2012   AST 21 06/05/2012   NA 144 06/05/2012   K 4.6 06/05/2012   CL 108 06/05/2012   CREATININE 1.0 06/05/2012   BUN 15 06/05/2012   CO2 27 06/05/2012   TSH 2.23 06/05/2012      Assessment & Plan:

## 2012-08-30 NOTE — Assessment & Plan Note (Signed)
She will continue her current meds.  

## 2012-08-30 NOTE — Patient Instructions (Signed)

## 2012-08-30 NOTE — Assessment & Plan Note (Signed)
At her request, cymbalta is changed to lexapro

## 2012-08-30 NOTE — Assessment & Plan Note (Signed)
Her BP is well controlled 

## 2012-10-03 DIAGNOSIS — Z48814 Encounter for surgical aftercare following surgery on the teeth or oral cavity: Secondary | ICD-10-CM

## 2012-10-03 DIAGNOSIS — Z48815 Encounter for surgical aftercare following surgery on the digestive system: Secondary | ICD-10-CM

## 2012-10-03 DIAGNOSIS — Z433 Encounter for attention to colostomy: Secondary | ICD-10-CM

## 2012-10-15 ENCOUNTER — Ambulatory Visit (INDEPENDENT_AMBULATORY_CARE_PROVIDER_SITE_OTHER): Payer: Medicare PPO | Admitting: Internal Medicine

## 2012-10-15 ENCOUNTER — Encounter: Payer: Self-pay | Admitting: Internal Medicine

## 2012-10-15 VITALS — BP 118/74 | HR 85 | Temp 97.8°F | Resp 16 | Ht 65.5 in | Wt 123.5 lb

## 2012-10-15 DIAGNOSIS — D519 Vitamin B12 deficiency anemia, unspecified: Secondary | ICD-10-CM

## 2012-10-15 DIAGNOSIS — Z23 Encounter for immunization: Secondary | ICD-10-CM

## 2012-10-15 DIAGNOSIS — I10 Essential (primary) hypertension: Secondary | ICD-10-CM

## 2012-10-15 DIAGNOSIS — D518 Other vitamin B12 deficiency anemias: Secondary | ICD-10-CM

## 2012-10-15 MED ORDER — CYANOCOBALAMIN 1000 MCG/ML IJ SOLN
1000.0000 ug | Freq: Once | INTRAMUSCULAR | Status: AC
  Administered 2012-10-15: 1000 ug via INTRAMUSCULAR

## 2012-10-15 NOTE — Assessment & Plan Note (Signed)
B12 injection today 

## 2012-10-15 NOTE — Progress Notes (Signed)
  Subjective:    Patient ID: Sherri Weber, female    DOB: [birth year], 78 y.o.   MRN: [id number]  Hypertension This is a chronic problem. The current episode started more than 1 year ago. The problem has been gradually improving since onset. The problem is controlled. Associated symptoms include malaise/fatigue. Pertinent negatives include no anxiety, blurred vision, chest pain, headaches, neck pain, orthopnea, palpitations, peripheral edema, PND, shortness of breath or sweats. Past treatments include nothing. The current treatment provides moderate improvement. There are no compliance problems.       Review of Systems  Constitutional: Positive for malaise/fatigue and fatigue. Negative for fever, chills, diaphoresis, activity change, appetite change and unexpected weight change.  HENT: Negative.  Negative for neck pain.   Eyes: Negative.  Negative for blurred vision.  Respiratory: Negative for cough, chest tightness, shortness of breath, wheezing and stridor.   Cardiovascular: Negative for chest pain, palpitations, orthopnea, leg swelling and PND.  Gastrointestinal: Positive for diarrhea. Negative for nausea, vomiting, abdominal pain, blood in stool, abdominal distention and anal bleeding.  Genitourinary: Negative.   Musculoskeletal: Negative.  Negative for myalgias, back pain, joint swelling and arthralgias.  Skin: Negative.   Neurological: Negative.  Negative for headaches.  Hematological: Negative for adenopathy. Does not bruise/bleed easily.  Psychiatric/Behavioral: Negative.        Objective:   Physical Exam  Vitals reviewed. Constitutional: She is oriented to person, place, and time. She appears well-developed and well-nourished. No distress.  HENT:  Head: Normocephalic and atraumatic.  Mouth/Throat: Oropharynx is clear and moist. No oropharyngeal exudate.  Eyes: Conjunctivae normal are normal. Right eye exhibits no discharge. Left eye exhibits no discharge. No scleral  icterus.  Neck: Normal range of motion. Neck supple. No JVD present. No tracheal deviation present. No thyromegaly present.  Cardiovascular: Normal rate, regular rhythm, normal heart sounds and intact distal pulses.  Exam reveals no gallop and no friction rub.   No murmur heard. Pulmonary/Chest: Effort normal and breath sounds normal. No stridor. No respiratory distress. She has no wheezes. She has no rales. She exhibits no tenderness.  Abdominal: Soft. Bowel sounds are normal. She exhibits no distension and no mass. There is no tenderness. There is no rebound and no guarding.  Musculoskeletal: Normal range of motion. She exhibits no edema and no tenderness.  Lymphadenopathy:    She has no cervical adenopathy.  Neurological: She is oriented to person, place, and time.  Skin: Skin is warm and dry. No rash noted. She is not diaphoretic. No erythema. No pallor.  Psychiatric: She has a normal mood and affect. Her behavior is normal. Judgment and thought content normal.     Lab Results  Component Value Date   WBC 6.5 02/28/2012   HGB 13.8 02/28/2012   HCT 40.4 02/28/2012   PLT 307.0 02/28/2012   GLUCOSE 86 06/05/2012   CHOL 175 04/23/2012   TRIG 183.0* 04/23/2012   HDL 34.50* 04/23/2012   LDLDIRECT 65.0 02/28/2012   LDLCALC 104* 04/23/2012   ALT 15 06/05/2012   AST 21 06/05/2012   NA 144 06/05/2012   K 4.6 06/05/2012   CL 108 06/05/2012   CREATININE 1.0 06/05/2012   BUN 15 06/05/2012   CO2 27 06/05/2012   TSH 2.23 06/05/2012       Assessment & Plan:

## 2012-10-15 NOTE — Patient Instructions (Signed)
Pernicious Anemia Pernicious anemia may be an immune system illness. It causes the production of antibodies to cells of the stomach (parietal cells), and proteins produced by the stomach, which are needed to absorb vitamin B12. The result of this illness is the body does not absorb enough B12 from the diet. This leads to lessened red blood cell production which causes anemia. Vitamin B12 is needed for making red blood cells and keeping the nervous system healthy. Not enough vitamin B12 in your system slowly affects sensory and motor nerves. This causes problems with your nervous system (neurological) to develop over time. Neurological effects of vitamin B12 deficiency may be seen before anemia is diagnosed. This affects both men and women, between ages 40 and 70. The anemia also affects the bowel, the heart and vascular systems and cannot be prevented. CAUSES  Pernicious anemia is due to a lack of substance called intrinsic factor. This is a substance made by cells in the stomach. It makes it possible to absorb vitamin B12. The reason for the lack of this substance is unknown but it may be autoimmune, genetic, or both. SYMPTOMS  The following problems may be seen with this illness:  Problems develop slowly.  Rapid heart rate.  Nausea, appetite loss, and weight loss.  Difficulty maintaining proper balance.  Yellow eyes and skin.  Loss of deep tendon reflexes.  Depression.  Confusion, poor memory, and dementia.  Ringing in the ears (tinnitus).  Weakness, especially in the arms and legs.  Sore tongue.  Numbness or tingling in the hands and feet.  Pale lips, tongue, and gums.  Bleeding gums.  Shortness of breath.  Headache.  Fatigue. RISK OF VITAMIN B12 DEFICIENCY INCREASES WITH:  Diseases or surgery affecting the stomach.  Diabetes and autoimmune disorders. Autoimmune disorders are diseases where the body makes antibodies which attack your own body tissues.  Thyroid  disorders.  Genetic factors, such as in people of Northern European ancestry. It is rare in African Americans and Asians.  Family history of pernicious anemia.  Age over 40.  Strict vegetarian diet or infants breast-fed by a mother on a strict vegetarian diet.  Lack of stomach acid in older adults.  Parasitic infections and intestinal diseases.  Drugs such as H2 blockers, proton pump inhibitors, colchicine, neomycin, and aminosalicylic acid.  Alcoholism. PREVENTION  Pernicious anemia cannot be prevented but vitamin B12 deficiencies can be prevented.  For pernicious anemia, lifelong vitamin B12 therapy will help symptoms and prevent complications.  Dietary changes can prevent deficiency. B12 is mostly from animal sources so a deficiency is more likely in a vegetarian who does not eat eggs or dairy products. RELATED COMPLICATIONS  Heart failure.  Nerve damage that can not be reversed.  Gastric cancer. DIAGNOSIS  Your caregiver can determine what is wrong by:  Doing blood tests for vitamin B12 levels.  Checking for antibodies to the intrinsic factor.  Measuring the body's ability to absorb vitamin B12. TREATMENT   Life long treatment usually involves vitamin B12 replacement. Monthly vitamin B12 injections are the treatment of choice to correct vitamin B12 deficiency and may be given by the patient. This therapy corrects the anemia and it may correct the neurological complications if given early enough. About 1% of vitamin B12 is absorbed (even in the absence of intrinsic factor) so some caregivers recommend that elderly patients with gastric atrophy take oral vitamin B12 supplements in addition to monthly injections.  Some symptoms should start to clear up in a few days after   treatment begins but other symptoms may take several months.  Additionally, other conditions which may lead to a deficiency should be treated.  Stop drinking alcohol if alcoholism led to the vitamin  B12 deficiency.  For other patients, the vitamin may be taken by mouth or as a nasal gel (or in addition to injections).  Iron supplements may be prescribed.  Avoid taking high amounts of folic acid. It can mask the signs of vitamin B12 deficiency.  Activity may be limited until symptoms improve.  Eat a well-balanced diet.  People on strict vegetarian diets can change their diet or take vitamin B12 supplements for life. PROGNOSIS   When caught early, the prognosis is good. Most people will do well.  Patients with this illness have a higher incidence of cancer and polyps of the stomach.  Nervous system problems may not improve if treatment does not start soon enough. Document Released: 03/03/2004 Document Revised: 03/05/2012 Document Reviewed: 12/09/2008 [employer] Patient Information 2013 [employer].  

## 2012-10-15 NOTE — Assessment & Plan Note (Signed)
Her BP is well controlled 

## 2012-10-29 ENCOUNTER — Encounter: Payer: Self-pay | Admitting: Internal Medicine

## 2012-10-29 ENCOUNTER — Ambulatory Visit (INDEPENDENT_AMBULATORY_CARE_PROVIDER_SITE_OTHER): Payer: Medicare PPO | Admitting: Internal Medicine

## 2012-10-29 VITALS — BP 110/74 | HR 89 | Temp 97.7°F | Resp 16 | Wt 135.8 lb

## 2012-10-29 DIAGNOSIS — I10 Essential (primary) hypertension: Secondary | ICD-10-CM

## 2012-10-29 DIAGNOSIS — D519 Vitamin B12 deficiency anemia, unspecified: Secondary | ICD-10-CM

## 2012-10-29 DIAGNOSIS — K219 Gastro-esophageal reflux disease without esophagitis: Secondary | ICD-10-CM

## 2012-10-29 DIAGNOSIS — D518 Other vitamin B12 deficiency anemias: Secondary | ICD-10-CM

## 2012-10-29 MED ORDER — CYANOCOBALAMIN 1000 MCG/ML IJ SOLN
1000.0000 ug | INTRAMUSCULAR | Status: DC
  Administered 2012-10-29: 1000 ug via INTRAMUSCULAR

## 2012-10-29 MED ORDER — OMEPRAZOLE 40 MG PO CPDR: 40.0000 mg | DELAYED_RELEASE_CAPSULE | Freq: Every day | ORAL | Status: DC

## 2012-10-29 NOTE — Assessment & Plan Note (Signed)
B12 injection today 

## 2012-10-29 NOTE — Assessment & Plan Note (Signed)
She tells me that dexilant is too expensive so I have changed to omeprazole

## 2012-10-29 NOTE — Progress Notes (Signed)
Subjective:    Patient ID: Sherri Weber, female    DOB: 11/19/1933, 78 y.o.   MRN: [id number]  Gastrophageal Reflux She complains of belching and heartburn. She reports no abdominal pain, no chest pain, no choking, no coughing, no dysphagia, no early satiety, no globus sensation, no hoarse voice, no nausea, no sore throat, no stridor, no tooth decay, no water brash or no wheezing. This is a chronic problem. The current episode started more than 1 year ago. The problem occurs occasionally. The problem has been gradually improving. The heartburn duration is several minutes. The heartburn is located in the substernum. The heartburn is of mild intensity. The heartburn does not wake her from sleep. The heartburn does not limit her activity. The heartburn doesn't change with position. The symptoms are aggravated by certain foods. Associated symptoms include anemia and fatigue. Pertinent negatives include no melena, muscle weakness, orthopnea or weight loss. She has tried a PPI for the symptoms. The treatment provided significant relief.      Review of Systems  Constitutional: Positive for fatigue. Negative for fever, chills, weight loss, diaphoresis, appetite change and unexpected weight change.  HENT: Negative.  Negative for sore throat and hoarse voice.   Eyes: Negative.   Respiratory: Negative for cough, choking and wheezing.   Cardiovascular: Negative for chest pain, palpitations and leg swelling.  Gastrointestinal: Positive for heartburn. Negative for dysphagia, nausea, vomiting, abdominal pain, diarrhea, constipation, blood in stool, melena, abdominal distention and anal bleeding.  Genitourinary: Negative.   Musculoskeletal: Negative for myalgias, back pain, joint swelling, arthralgias, gait problem and muscle weakness.  Skin: Negative for color change, pallor, rash and wound.  Neurological: Negative.   Hematological: Negative for adenopathy. Does not bruise/bleed easily.    Psychiatric/Behavioral: Negative.        Objective:   Physical Exam  Vitals reviewed. Constitutional: She is oriented to person, place, and time. She appears well-developed and well-nourished. No distress.  HENT:  Head: Normocephalic and atraumatic.  Mouth/Throat: Oropharynx is clear and moist. No oropharyngeal exudate.  Eyes: Conjunctivae normal are normal. Right eye exhibits no discharge. Left eye exhibits no discharge. No scleral icterus.  Neck: Normal range of motion. Neck supple. No JVD present. No tracheal deviation present. No thyromegaly present.  Cardiovascular: Normal rate, regular rhythm, normal heart sounds and intact distal pulses.  Exam reveals no gallop and no friction rub.   No murmur heard. Pulmonary/Chest: Effort normal and breath sounds normal. No stridor. No respiratory distress. She has no wheezes. She has no rales. She exhibits no tenderness.  Abdominal: Soft. Bowel sounds are normal. She exhibits no distension and no mass. There is no tenderness. There is no rebound and no guarding.  Musculoskeletal: Normal range of motion. She exhibits no edema and no tenderness.  Lymphadenopathy:    She has no cervical adenopathy.  Neurological: She is oriented to person, place, and time.  Skin: Skin is warm and dry. No rash noted. She is not diaphoretic. No erythema. No pallor.  Psychiatric: She has a normal mood and affect. Her behavior is normal. Judgment and thought content normal.     Lab Results  Component Value Date   WBC 6.5 02/28/2012   HGB 13.8 02/28/2012   HCT 40.4 02/28/2012   PLT 307.0 02/28/2012   GLUCOSE 86 06/05/2012   CHOL 175 04/23/2012   TRIG 183.0* 04/23/2012   HDL 34.50* 04/23/2012   LDLDIRECT 65.0 02/28/2012   LDLCALC 104* 04/23/2012   ALT 15 06/05/2012   AST 21  06/05/2012   NA 144 06/05/2012   K 4.6 06/05/2012   CL 108 06/05/2012   CREATININE 1.0 06/05/2012   BUN 15 06/05/2012   CO2 27 06/05/2012   TSH 2.23 06/05/2012       Assessment & Plan:

## 2012-10-29 NOTE — Patient Instructions (Signed)

## 2012-10-29 NOTE — Assessment & Plan Note (Signed)
Her BP is well controlled 

## 2012-11-12 ENCOUNTER — Ambulatory Visit (INDEPENDENT_AMBULATORY_CARE_PROVIDER_SITE_OTHER): Payer: Medicare PPO

## 2012-11-12 DIAGNOSIS — D519 Vitamin B12 deficiency anemia, unspecified: Secondary | ICD-10-CM

## 2012-11-12 DIAGNOSIS — D518 Other vitamin B12 deficiency anemias: Secondary | ICD-10-CM

## 2012-11-12 MED ORDER — CYANOCOBALAMIN 1000 MCG/ML IJ SOLN
1000.0000 ug | Freq: Once | INTRAMUSCULAR | Status: AC
  Administered 2012-11-12: 1000 ug via INTRAMUSCULAR

## 2012-11-20 ENCOUNTER — Ambulatory Visit: Payer: Medicare PPO | Admitting: Internal Medicine

## 2012-11-21 ENCOUNTER — Ambulatory Visit: Payer: Medicare PPO | Admitting: Internal Medicine

## 2012-11-26 ENCOUNTER — Other Ambulatory Visit (INDEPENDENT_AMBULATORY_CARE_PROVIDER_SITE_OTHER): Payer: Medicare PPO

## 2012-11-26 ENCOUNTER — Ambulatory Visit (INDEPENDENT_AMBULATORY_CARE_PROVIDER_SITE_OTHER): Payer: Medicare PPO | Admitting: Internal Medicine

## 2012-11-26 ENCOUNTER — Encounter: Payer: Self-pay | Admitting: Internal Medicine

## 2012-11-26 VITALS — BP 130/72 | HR 80 | Temp 98.2°F | Resp 16 | Wt 132.5 lb

## 2012-11-26 DIAGNOSIS — N390 Urinary tract infection, site not specified: Secondary | ICD-10-CM

## 2012-11-26 DIAGNOSIS — D519 Vitamin B12 deficiency anemia, unspecified: Secondary | ICD-10-CM

## 2012-11-26 DIAGNOSIS — D518 Other vitamin B12 deficiency anemias: Secondary | ICD-10-CM

## 2012-11-26 LAB — URINALYSIS, ROUTINE W REFLEX MICROSCOPIC
Bilirubin Urine: NEGATIVE
Hgb urine dipstick: NEGATIVE
Ketones, ur: NEGATIVE
Leukocytes, UA: NEGATIVE
Nitrite: NEGATIVE
Specific Gravity, Urine: 1.01 (ref 1.000–1.030)
Total Protein, Urine: NEGATIVE
Urine Glucose: NEGATIVE
Urobilinogen, UA: 0.2 (ref 0.0–1.0)
pH: 6.5 (ref 5.0–8.0)

## 2012-11-26 MED ORDER — CYANOCOBALAMIN 1000 MCG/ML IJ SOLN
1000.0000 ug | Freq: Once | INTRAMUSCULAR | Status: AC
  Administered 2012-11-26: 1000 ug via INTRAMUSCULAR

## 2012-11-26 NOTE — Assessment & Plan Note (Signed)
I will recheck her UA and urine clx and will treat if needed

## 2012-11-26 NOTE — Progress Notes (Signed)
Subjective:    Patient ID: Sherri Weber, female    DOB: [birth year], 78 y.o.   MRN: [id number]  Urinary Tract Infection  This is a recurrent problem. The current episode started in the past 7 days. The problem occurs intermittently. The problem has been gradually improving. The quality of the pain is described as burning. The pain is at a severity of 0/10. The patient is experiencing no pain. There has been no fever. She is not sexually active. There is no history of pyelonephritis. Pertinent negatives include no chills, discharge, flank pain, frequency, hematuria, hesitancy, nausea, sweats, urgency or vomiting. She has tried antibiotics for the symptoms. The treatment provided significant relief. Her past medical history is significant for recurrent UTIs.      Review of Systems  Constitutional: Negative.  Negative for fever, chills and fatigue.  HENT: Negative.   Eyes: Negative.   Respiratory: Negative for cough, chest tightness, shortness of breath, wheezing and stridor.   Cardiovascular: Negative for chest pain, palpitations and leg swelling.  Gastrointestinal: Negative for nausea, vomiting, abdominal pain, diarrhea and constipation.  Genitourinary: Negative for dysuria, hesitancy, urgency, frequency, hematuria, flank pain, decreased urine volume, vaginal bleeding, vaginal discharge, enuresis, difficulty urinating, genital sores, vaginal pain, menstrual problem, pelvic pain and dyspareunia.  Musculoskeletal: Negative.   Skin: Negative.   Neurological: Negative.   Hematological: Negative.   Psychiatric/Behavioral: Negative.        Objective:   Physical Exam  Vitals reviewed. Constitutional: She is oriented to person, place, and time. She appears well-developed and well-nourished.  Non-toxic appearance. She does not have a sickly appearance. She does not appear ill. No distress.  HENT:  Head: Normocephalic and atraumatic.  Mouth/Throat: Oropharynx is clear and moist. No  oropharyngeal exudate.  Eyes: Conjunctivae normal are normal. Right eye exhibits no discharge. Left eye exhibits no discharge. No scleral icterus.  Neck: Normal range of motion. Neck supple. No JVD present. No tracheal deviation present. No thyromegaly present.  Cardiovascular: Normal rate, regular rhythm, normal heart sounds and intact distal pulses.  Exam reveals no gallop and no friction rub.   No murmur heard. Pulmonary/Chest: Effort normal and breath sounds normal. No stridor. No respiratory distress. She has no wheezes. She has no rales. She exhibits no tenderness.  Abdominal: Soft. Bowel sounds are normal. She exhibits no distension and no mass. There is no hepatosplenomegaly. There is no tenderness. There is no rebound, no guarding and no CVA tenderness.  Musculoskeletal: Normal range of motion. She exhibits no edema and no tenderness.  Lymphadenopathy:    She has no cervical adenopathy.  Neurological: She is oriented to person, place, and time.  Skin: Skin is warm and dry. No rash noted. She is not diaphoretic. No erythema. No pallor.  Psychiatric: She has a normal mood and affect. Her behavior is normal. Judgment and thought content normal.      Lab Results  Component Value Date   WBC 6.5 02/28/2012   HGB 13.8 02/28/2012   HCT 40.4 02/28/2012   PLT 307.0 02/28/2012   GLUCOSE 86 06/05/2012   CHOL 175 04/23/2012   TRIG 183.0* 04/23/2012   HDL 34.50* 04/23/2012   LDLDIRECT 65.0 02/28/2012   LDLCALC 104* 04/23/2012   ALT 15 06/05/2012   AST 21 06/05/2012   NA 144 06/05/2012   K 4.6 06/05/2012   CL 108 06/05/2012   CREATININE 1.0 06/05/2012   BUN 15 06/05/2012   CO2 27 06/05/2012   TSH 2.23 06/05/2012  Assessment & Plan:

## 2012-11-26 NOTE — Patient Instructions (Signed)
Urinary Tract Infection Urinary tract infections (UTIs) can develop anywhere along your urinary tract. Your urinary tract is your body's drainage system for removing wastes and extra water. Your urinary tract includes two kidneys, two ureters, a bladder, and a urethra. Your kidneys are a pair of bean-shaped organs. Each kidney is about the size of your fist. They are located below your ribs, one on each side of your spine. CAUSES Infections are caused by microbes, which are microscopic organisms, including fungi, viruses, and bacteria. These organisms are so small that they can only be seen through a microscope. Bacteria are the microbes that most commonly cause UTIs. SYMPTOMS  Symptoms of UTIs may vary by age and gender of the patient and by the location of the infection. Symptoms in young women typically include a frequent and intense urge to urinate and a painful, burning feeling in the bladder or urethra during urination. Older women and men are more likely to be tired, shaky, and weak and have muscle aches and abdominal pain. A fever may mean the infection is in your kidneys. Other symptoms of a kidney infection include pain in your back or sides below the ribs, nausea, and vomiting. DIAGNOSIS To diagnose a UTI, your caregiver will ask you about your symptoms. Your caregiver also will ask to provide a urine sample. The urine sample will be tested for bacteria and white blood cells. White blood cells are made by your body to help fight infection. TREATMENT  Typically, UTIs can be treated with medication. Because most UTIs are caused by a bacterial infection, they usually can be treated with the use of antibiotics. The choice of antibiotic and length of treatment depend on your symptoms and the type of bacteria causing your infection. HOME CARE INSTRUCTIONS  If you were prescribed antibiotics, take them exactly as your caregiver instructs you. Finish the medication even if you feel better after you  have only taken some of the medication.  Drink enough water and fluids to keep your urine clear or pale yellow.  Avoid caffeine, tea, and carbonated beverages. They tend to irritate your bladder.  Empty your bladder often. Avoid holding urine for long periods of time.  Empty your bladder before and after sexual intercourse.  After a bowel movement, women should cleanse from front to back. Use each tissue only once. SEEK MEDICAL CARE IF:   You have back pain.  You develop a fever.  Your symptoms do not begin to resolve within 3 days. SEEK IMMEDIATE MEDICAL CARE IF:   You have severe back pain or lower abdominal pain.  You develop chills.  You have nausea or vomiting.  You have continued burning or discomfort with urination. MAKE SURE YOU:   Understand these instructions.  Will watch your condition.  Will get help right away if you are not doing well or get worse. Document Released: 09/21/2005 Document Revised: 06/12/2012 Document Reviewed: 01/20/2012 [employer] Patient Information 2013 [employer].  

## 2012-11-27 LAB — CULTURE, URINE COMPREHENSIVE
Colony Count: NO GROWTH
Organism ID, Bacteria: NO GROWTH

## 2012-11-28 ENCOUNTER — Ambulatory Visit: Payer: Medicare PPO | Admitting: Internal Medicine

## 2012-12-03 ENCOUNTER — Ambulatory Visit: Payer: Medicare PPO | Admitting: Internal Medicine

## 2012-12-04 ENCOUNTER — Encounter (HOSPITAL_COMMUNITY): Payer: Self-pay | Admitting: Emergency Medicine

## 2012-12-04 ENCOUNTER — Emergency Department (HOSPITAL_COMMUNITY)
Admission: EM | Admit: 2012-12-04 | Discharge: 2012-12-04 | Disposition: A | Discharge status: Home / Self Care | Payer: Medicare PPO | Attending: Emergency Medicine | Admitting: Emergency Medicine

## 2012-12-04 DIAGNOSIS — Z933 Colostomy status: Secondary | ICD-10-CM | POA: Insufficient documentation

## 2012-12-04 DIAGNOSIS — Z79899 Other long term (current) drug therapy: Secondary | ICD-10-CM | POA: Insufficient documentation

## 2012-12-04 DIAGNOSIS — K589 Irritable bowel syndrome without diarrhea: Secondary | ICD-10-CM | POA: Insufficient documentation

## 2012-12-04 DIAGNOSIS — F419 Anxiety disorder, unspecified: Secondary | ICD-10-CM

## 2012-12-04 DIAGNOSIS — F411 Generalized anxiety disorder: Secondary | ICD-10-CM | POA: Insufficient documentation

## 2012-12-04 DIAGNOSIS — R42 Dizziness and giddiness: Secondary | ICD-10-CM | POA: Insufficient documentation

## 2012-12-04 DIAGNOSIS — M1990 Unspecified osteoarthritis, unspecified site: Secondary | ICD-10-CM | POA: Insufficient documentation

## 2012-12-04 DIAGNOSIS — F3289 Other specified depressive episodes: Secondary | ICD-10-CM | POA: Insufficient documentation

## 2012-12-04 DIAGNOSIS — K5900 Constipation, unspecified: Secondary | ICD-10-CM | POA: Insufficient documentation

## 2012-12-04 DIAGNOSIS — F329 Major depressive disorder, single episode, unspecified: Secondary | ICD-10-CM

## 2012-12-04 DIAGNOSIS — F4321 Adjustment disorder with depressed mood: Secondary | ICD-10-CM

## 2012-12-04 DIAGNOSIS — Z9889 Other specified postprocedural states: Secondary | ICD-10-CM | POA: Insufficient documentation

## 2012-12-04 DIAGNOSIS — Z9089 Acquired absence of other organs: Secondary | ICD-10-CM | POA: Insufficient documentation

## 2012-12-04 MED ORDER — ALUM & MAG HYDROXIDE-SIMETH 200-200-20 MG/5ML PO SUSP: 30.0000 mL | ORAL | Status: DC | PRN

## 2012-12-04 MED ORDER — ZOLPIDEM TARTRATE 5 MG PO TABS: 5.0000 mg | ORAL_TABLET | Freq: Every evening | ORAL | Status: DC | PRN

## 2012-12-04 MED ORDER — ACETAMINOPHEN 325 MG PO TABS: 650.0000 mg | ORAL_TABLET | ORAL | Status: DC | PRN

## 2012-12-04 MED ORDER — IBUPROFEN 200 MG PO TABS: 600.0000 mg | ORAL_TABLET | Freq: Three times a day (TID) | ORAL | Status: DC | PRN

## 2012-12-04 MED ORDER — ONDANSETRON HCL 4 MG PO TABS: 4.0000 mg | ORAL_TABLET | Freq: Three times a day (TID) | ORAL | Status: DC | PRN

## 2012-12-04 MED ORDER — NICOTINE 21 MG/24HR TD PT24: 21.0000 mg | MEDICATED_PATCH | Freq: Every day | TRANSDERMAL | Status: DC

## 2012-12-04 NOTE — ED Provider Notes (Signed)
Patient reports she recently moved into a new apartment complex and she does not feel safe there. She is thinking she needs to move somewhere else. She states yesterday she felt dizzy which means week. She denies any spinning or headache. She reported to the emergency department she was concerned she may be having a stroke. However her main concern seems to be stressed about her new apartment and living situation. She reports she has an appointment with her PCP in 3 days, which is Dr. Yetta Barre with [facility]. She however states she feels safe to go home and make new living arrangements. Patient has had tele-psych consult done by Dr Berlin Hun who feels patient is psychiatrically stable and can be discharged for referral to local outpatient mental health facility. Patient is agreeable to this.  The patient is alert and cooperative very pleasant. I do not see any evidence of a stroke. She was advised to discuss her dizziness with her PCP at her appointment in 3 days.I will have [facility] give her mental health referral.   Plan discharge  Devoria Albe, MD, Franz Dell, MD 12/04/12 [phone]

## 2012-12-04 NOTE — ED Provider Notes (Signed)
Medical screening examination/treatment/procedure(s) were performed by non-physician practitioner and as supervising physician I was immediately available for consultation/collaboration.  Shweta Aman R. Nycole Kawahara, MD 12/04/12 1633 

## 2012-12-04 NOTE — ED Provider Notes (Signed)
History     CSN: [id number]  Arrival date & time 12/04/12  1017   First MD Initiated Contact with Patient 12/04/12 1125      Chief Complaint  Patient presents with  . Medical Clearance    (Consider location/radiation/quality/duration/timing/severity/associated sxs/prior treatment) HPI] Patient presents to the ED with complaints of feeling sad and anxious. She would like medication to help her calm down. She says that she is under a lot of stress. She has a lot of emotional stressors. She recently moved her ffrom [place] and misses her family. She says that she buys her grand kids dirt bikes and they never call her. She had surgery on her abdomen (Colostomy bag in place due to rectal prolapse) and was in the hospital for 5 days and no one in her family came to see her. In the past, her daughter OD on some medication ebcause she is bipolar. The patient is tearful and a bit scattered. She has no history of depression or anxiety. Her PCP Dr. Yetta Barre would not prescribe her any psychiatric medications without a psychiatry consult first. She never follow-up with the resources he gave her. She denies having SI/HI. She agrees to talk to the Psychiatrist here today. nad vss  Past Medical History  Diagnosis Date  . Depressive disorder, not elsewhere classified   . Diarrhea   . Other malaise and fatigue   . Abdominal distension   . Hemorrhoids   . Chronic constipation   . Family history of malignant neoplasm of gastrointestinal tract   . Rectal prolapse   . Ulcer of anus and rectum   . Irritable bowel syndrome   . Low back pain   . Osteoarthritis     Past Surgical History  Procedure Date  . Abdominal hysterectomy   . Cholecystectomy   . Appendectomy   . Hemicolectomy   . Cecectomy   . Colon surgery   . Colostomy     Family History  Problem Relation Age of Onset  . Cancer Sister     colon  . Diabetes Sister   . Mental retardation Maternal Uncle     History  Substance Use Topics   . Smoking status: Never Smoker   . Smokeless tobacco: Never Used  . Alcohol Use: No    OB History    Grav Para Term Preterm Abortions TAB SAB Ect Mult Living                  Review of Systems  Psychiatric/Behavioral: Positive for dysphoric mood.  All other systems reviewed and are negative.    Allergies  Review of patient's allergies indicates no known allergies.  Home Medications   Current Outpatient Rx  Name  Route  Sig  Dispense  Refill  . ALPRAZOLAM 2 MG PO TABS   Oral   Take 1 tablet (2 mg total) by mouth 3 (three) times daily as needed.   65 tablet   5   . CHOLINE FENOFIBRATE 135 MG PO CPDR   Oral   Take 1 capsule (135 mg total) by mouth daily.   90 capsule   1   . COLESTIPOL HCL 1 G PO TABS   Oral   Take 1 tablet (1 g total) by mouth 2 (two) times daily.   60 tablet   11   . CYCLOBENZAPRINE HCL 10 MG PO TABS   Oral   Take 1 tablet (10 mg total) by mouth 2 (two) times daily as needed for muscle spasms.  60 tablet   11   . HYDROCHLOROTHIAZIDE 12.5 MG PO TABS   Oral   Take 1 tablet (12.5 mg total) by mouth daily.   90 tablet   1   . HYDROCODONE-ACETAMINOPHEN 5-500 MG PO TABS   Oral   Take 1 tablet by mouth every 6 (six) hours as needed for pain.   90 tablet   5   . OMEPRAZOLE 40 MG PO CPDR   Oral   Take 1 capsule (40 mg total) by mouth daily.   90 capsule   3   . OXYBUTYNIN CHLORIDE ER 10 MG PO TB24   Oral   Take 1 tablet (10 mg total) by mouth daily.   90 tablet   3   . ALIGN 4 MG PO CAPS   Oral   Take 1 capsule by mouth daily.   140 capsule   0   . TRAMADOL HCL 50 MG PO TABS   Oral   Take 50 mg by mouth every 6 (six) hours as needed. pain         . TRIMETHOPRIM 100 MG PO TABS   Oral   Take 100 mg by mouth at bedtime.         . BENEFIBER PO   Oral   Take by mouth.           . ZOLPIDEM TARTRATE 10 MG PO TABS   Oral   Take 1 tablet (10 mg total) by mouth at bedtime as needed.   30 tablet   5   . ESCITALOPRAM  OXALATE 10 MG PO TABS   Oral   Take 1 tablet (10 mg total) by mouth daily.   90 tablet   3   . NITROFURANTOIN MACROCRYSTAL 100 MG PO CAPS   Oral   Take 1 capsule (100 mg total) by mouth at bedtime.   90 capsule   3   . PHENDIMETRAZINE TARTRATE ER 105 MG PO CP24   Oral   Take 1 capsule (105 mg total) by mouth daily.   30 each   2     BP 129/61  Pulse 85  Temp 97.9 F (36.6 C) (Oral)  Resp 20  SpO2 98%  Physical Exam  Nursing note and vitals reviewed. Constitutional: She appears well-developed and well-nourished. No distress.  HENT:  Head: Normocephalic and atraumatic.  Eyes: Pupils are equal, round, and reactive to light.  Neck: Normal range of motion. Neck supple.  Cardiovascular: Normal rate and regular rhythm.   Pulmonary/Chest: Effort normal.  Abdominal: Soft.    Neurological: She is alert.  Skin: Skin is warm and dry.  Psychiatric: Her mood appears anxious. She is agitated. Thought content is not paranoid and not delusional. She exhibits a depressed mood. She expresses no homicidal and no suicidal ideation. She expresses no suicidal plans and no homicidal plans.    ED Course  Procedures (including critical care time)  Labs Reviewed - No data to display No results found.   No diagnosis found. 1. Depressed 2. anxious   MDM  Pt appears to be medically sound. Followed closely by DR. Yetta Barre. Telepsych ordered for medication recommendation.  2:30pm Telepsych papers have been signed. Pt hand off to oncoming fast track PA for follow-up on Telepsych.     Dorthula Matas, PA 12/04/12 1431

## 2012-12-04 NOTE — Consult Note (Signed)
Reason for Consult: depression and anxiety Referring Physician: Dr. Gershon Weber is an 77 y.o. female.  HPI: patient was seen and chart reviewed.  Patient came to the [facility] long emergency department voluntarily and complaining about feeling  dizziness yesterday and thought she is going to have the stroke. Patient was scared of for having the stroke because her sister suffered with a stroke after feeling dizziness 5 years ago. Patient was not able to call the primary care physician or drive into the office yesterday. She stayed back in her apartment, relaxed by watching television and dizziness went away. Patient daughter was diagnosed with bipolar disorder and staying with her 2 young sons in [place]. Patient was lived there about a year in [place], but did not like it, so came back and than was lived in [place], [place]. Patient does not like staying there because no family members are in touch with her, so she has decided to stay in a apartment in [place]. Patient was recently relocated about a week ago. She suffered with the rectal prolapse and required colostomy bag, in July 2013, which is in place at this time and has no reported complications. Patient was worried that no family members for seeing her while she was in hospital. Patient feesl lonely and trying to be in contact with her previous friends in [place]. She denies current symptoms of for depression and anxiety.  MSE: . Patient was the appeared staying in her room was lying down supine position and watching television. Not appear to be in distress. Patient was awake, alert, oriented to time, place, person and the situation. Patient was feeling better being the, who check in the in the emergency department. Does not have a, dizziness, depression, and anxiety. Patient has denied suicidal, homicidal ideations, has no evidence of psychotic symptoms. Patient has normal rate, and volume of speech. Patient was more talkative  than usual. Patient has fair insight, judgment and impulse control.   Past Medical History  Diagnosis Date  . Depressive disorder, not elsewhere classified   . Diarrhea   . Other malaise and fatigue   . Abdominal distension   . Hemorrhoids   . Chronic constipation   . Family history of malignant neoplasm of gastrointestinal tract   . Rectal prolapse   . Ulcer of anus and rectum   . Irritable bowel syndrome   . Low back pain   . Osteoarthritis     Past Surgical History  Procedure Date  . Abdominal hysterectomy   . Cholecystectomy   . Appendectomy   . Hemicolectomy   . Cecectomy   . Colon surgery   . Colostomy     Family History  Problem Relation Age of Onset  . Cancer Sister     colon  . Diabetes Sister   . Mental retardation Maternal Uncle     Social History:  reports that she has never smoked. She has never used smokeless tobacco. She reports that she does not drink alcohol or use illicit drugs.  Allergies: No Known Allergies  Medications: I have reviewed the patient's current medications.  No results found for this or any previous visit (from the past 48 hour(s)).  No results found.  Positive for anxiety, bad mood, sleep disturbance and Feeling alone and recent relocation. Blood pressure 129/61, pulse 85, temperature 97.9 F (36.6 C), temperature source Oral, resp. rate 20, SpO2 98.00%.   Assessment/Plan: Adjustment disorder with disturbance of mood. Rule out major depressive disorder. Limited psychosocial  support  Recommendations: Patient does not meet criteria for acute psychiatric hospitalization so will be referred to the outpatient psychiatric services.  Sherri Weber,[name]. 12/04/2012, 3:47 PM

## 2012-12-04 NOTE — ED Provider Notes (Signed)
4:26 PM Patient signed out to be from Marlon Pel, PA-C. Patient disposition pending Telepsych assessment.   8:22 PM Patient seen by Dr. Lynelle Doctor who also discharged the patient.   Emilia Beck, PA-C 12/04/12 2022

## 2012-12-04 NOTE — ED Notes (Signed)
Pt complains of feeling "shakey and jittery" Pt tearful when in triage discussing missing her family that is out of state.

## 2012-12-04 NOTE — ED Provider Notes (Signed)
See prior note   Ward Givens, MD 12/04/12 2352

## 2012-12-05 ENCOUNTER — Ambulatory Visit: Payer: Medicare PPO | Admitting: Internal Medicine

## 2012-12-06 ENCOUNTER — Telehealth: Payer: Self-pay

## 2012-12-06 DIAGNOSIS — K920 Hematemesis: Secondary | ICD-10-CM

## 2012-12-06 DIAGNOSIS — K219 Gastro-esophageal reflux disease without esophagitis: Secondary | ICD-10-CM

## 2012-12-06 MED ORDER — PROMETHAZINE HCL 12.5 MG PO TABS: 12.5000 mg | ORAL_TABLET | Freq: Four times a day (QID) | ORAL | Status: DC | PRN

## 2012-12-06 NOTE — Telephone Encounter (Signed)
Received fax from pharmacy requesting refill on promethazine 12.5mg  last filled 04/2012. Please Francee Piccolo if ok

## 2012-12-06 NOTE — Telephone Encounter (Signed)
ok 

## 2012-12-07 ENCOUNTER — Ambulatory Visit: Payer: Medicare PPO | Admitting: Internal Medicine

## 2012-12-07 ENCOUNTER — Telehealth: Payer: Self-pay | Admitting: Internal Medicine

## 2012-12-07 ENCOUNTER — Encounter: Payer: Self-pay | Admitting: Internal Medicine

## 2012-12-07 ENCOUNTER — Ambulatory Visit (INDEPENDENT_AMBULATORY_CARE_PROVIDER_SITE_OTHER): Payer: Medicare PPO | Admitting: Internal Medicine

## 2012-12-07 VITALS — BP 120/68 | HR 86 | Temp 99.0°F | Resp 16 | Wt 132.0 lb

## 2012-12-07 DIAGNOSIS — F329 Major depressive disorder, single episode, unspecified: Secondary | ICD-10-CM

## 2012-12-07 DIAGNOSIS — K9419 Other complications of enterostomy: Secondary | ICD-10-CM | POA: Insufficient documentation

## 2012-12-07 DIAGNOSIS — Z7189 Other specified counseling: Secondary | ICD-10-CM

## 2012-12-07 DIAGNOSIS — M545 Low back pain, unspecified: Secondary | ICD-10-CM

## 2012-12-07 DIAGNOSIS — I10 Essential (primary) hypertension: Secondary | ICD-10-CM

## 2012-12-07 MED ORDER — SERTRALINE HCL 50 MG PO TABS: 50.0000 mg | ORAL_TABLET | Freq: Every day | ORAL | Status: DC

## 2012-12-07 NOTE — Assessment & Plan Note (Signed)
Continue current meds for pain 

## 2012-12-07 NOTE — Patient Instructions (Signed)

## 2012-12-07 NOTE — Assessment & Plan Note (Signed)
She needs some help with ostomy care so I have asked [facility] to see her

## 2012-12-07 NOTE — Progress Notes (Signed)
Subjective:    Patient ID: Sherri Weber, female    DOB: 1933-05-10, 78 y.o.   MRN: [id number]  HPI  She returns for f/up and complains that she has been feeling sad and anxious lately, she was seen in the ER a few days ago. She has not been taking lexapro because she did not feel like it helped her though she admits that she did not take it very long.   Review of Systems  Constitutional: Negative.   HENT: Negative.   Eyes: Negative.   Respiratory: Negative for cough, chest tightness, shortness of breath, wheezing and stridor.   Cardiovascular: Negative for chest pain, palpitations and leg swelling.  Gastrointestinal: Negative for nausea, vomiting, abdominal pain, diarrhea, constipation and blood in stool.  Genitourinary: Negative.   Musculoskeletal: Positive for back pain. Negative for myalgias, joint swelling, arthralgias and gait problem.  Skin: Negative.   Neurological: Negative.   Hematological: Negative for adenopathy. Does not bruise/bleed easily.  Psychiatric/Behavioral: Positive for sleep disturbance and dysphoric mood. Negative for suicidal ideas, hallucinations, behavioral problems, confusion, self-injury, decreased concentration and agitation. The patient is nervous/anxious. The patient is not hyperactive.        Objective:   Physical Exam  Vitals reviewed. Constitutional: She is oriented to person, place, and time. She appears well-developed and well-nourished.  Non-toxic appearance. She does not have a sickly appearance. She does not appear ill. No distress.  HENT:  Head: Normocephalic and atraumatic.  Mouth/Throat: Oropharynx is clear and moist. No oropharyngeal exudate.  Eyes: Conjunctivae normal are normal. Right eye exhibits no discharge. Left eye exhibits no discharge. No scleral icterus.  Neck: Normal range of motion. Neck supple. No JVD present. No tracheal deviation present. No thyromegaly present.  Cardiovascular: Normal rate, regular rhythm, normal heart  sounds and intact distal pulses.  Exam reveals no gallop and no friction rub.   No murmur heard. Pulmonary/Chest: Effort normal and breath sounds normal. No stridor. No respiratory distress. She has no wheezes. She has no rales. She exhibits no tenderness.  Abdominal: Soft. Bowel sounds are normal. She exhibits no distension and no mass. There is no tenderness. There is no rebound and no guarding.  Musculoskeletal: Normal range of motion. She exhibits no edema and no tenderness.  Lymphadenopathy:    She has no cervical adenopathy.  Neurological: She is oriented to person, place, and time.  Skin: Skin is warm and dry. No rash noted. She is not diaphoretic. No erythema. No pallor.  Psychiatric: She has a normal mood and affect. Her behavior is normal. Judgment and thought content normal. Her mood appears not anxious. Her affect is not angry, not blunt, not labile and not inappropriate. Her speech is not rapid and/or pressured, not delayed, not tangential and not slurred. Cognition and memory are normal. She does not exhibit a depressed mood. She is communicative.      Lab Results  Component Value Date   WBC 6.5 02/28/2012   HGB 13.8 02/28/2012   HCT 40.4 02/28/2012   PLT 307.0 02/28/2012   GLUCOSE 86 06/05/2012   CHOL 175 04/23/2012   TRIG 183.0* 04/23/2012   HDL 34.50* 04/23/2012   LDLDIRECT 65.0 02/28/2012   LDLCALC 104* 04/23/2012   ALT 15 06/05/2012   AST 21 06/05/2012   NA 144 06/05/2012   K 4.6 06/05/2012   CL 108 06/05/2012   CREATININE 1.0 06/05/2012   BUN 15 06/05/2012   CO2 27 06/05/2012   TSH 2.23 06/05/2012  Assessment & Plan:

## 2012-12-07 NOTE — Telephone Encounter (Signed)
ok 

## 2012-12-07 NOTE — Telephone Encounter (Signed)
The patient is hoping to get a letter to send her apt complex to get out of her lease, due to not wanting to stay alone.  I'll be happy to write this up if you prefer.    Thanks!

## 2012-12-07 NOTE — Assessment & Plan Note (Signed)
I have asked her to start sertraline

## 2012-12-07 NOTE — Assessment & Plan Note (Signed)
Her BP is well controlled 

## 2012-12-12 ENCOUNTER — Encounter: Payer: Self-pay | Admitting: Internal Medicine

## 2012-12-21 DIAGNOSIS — R5381 Other malaise: Secondary | ICD-10-CM

## 2012-12-21 DIAGNOSIS — I10 Essential (primary) hypertension: Secondary | ICD-10-CM

## 2012-12-21 DIAGNOSIS — R5383 Other fatigue: Secondary | ICD-10-CM

## 2012-12-21 DIAGNOSIS — Z433 Encounter for attention to colostomy: Secondary | ICD-10-CM

## 2012-12-21 DIAGNOSIS — Z466 Encounter for fitting and adjustment of urinary device: Secondary | ICD-10-CM

## 2012-12-24 ENCOUNTER — Encounter: Payer: Self-pay | Admitting: Internal Medicine

## 2012-12-24 ENCOUNTER — Ambulatory Visit (INDEPENDENT_AMBULATORY_CARE_PROVIDER_SITE_OTHER): Payer: Medicare PPO | Admitting: Internal Medicine

## 2012-12-24 VITALS — BP 140/80 | HR 98 | Temp 97.3°F | Resp 16 | Wt 128.0 lb

## 2012-12-24 DIAGNOSIS — A09 Infectious gastroenteritis and colitis, unspecified: Secondary | ICD-10-CM | POA: Insufficient documentation

## 2012-12-24 DIAGNOSIS — E669 Obesity, unspecified: Secondary | ICD-10-CM

## 2012-12-24 DIAGNOSIS — R609 Edema, unspecified: Secondary | ICD-10-CM

## 2012-12-24 DIAGNOSIS — I10 Essential (primary) hypertension: Secondary | ICD-10-CM

## 2012-12-24 DIAGNOSIS — R197 Diarrhea, unspecified: Secondary | ICD-10-CM

## 2012-12-24 MED ORDER — HYDROCHLOROTHIAZIDE 12.5 MG PO TABS: 12.5000 mg | ORAL_TABLET | Freq: Every day | ORAL | Status: DC

## 2012-12-24 MED ORDER — PHENDIMETRAZINE TARTRATE ER 105 MG PO CP24: 1.0000 | ORAL_CAPSULE | Freq: Every day | ORAL | Status: DC

## 2012-12-24 NOTE — Assessment & Plan Note (Signed)
Her BP is well controlled 

## 2012-12-24 NOTE — Assessment & Plan Note (Signed)
I will check her stool studies to look for infection

## 2012-12-24 NOTE — Patient Instructions (Signed)
Diarrhea Infections caused by germs (bacterial) or a virus commonly cause diarrhea. Your caregiver has determined that with time, rest and fluids, the diarrhea should improve. In general, eat normally while drinking more water than usual. Although water may prevent dehydration, it does not contain salt and minerals (electrolytes). Broths, weak tea without caffeine and oral rehydration solutions (ORS) replace fluids and electrolytes. Small amounts of fluids should be taken frequently. Large amounts at one time may not be tolerated. Plain water may be harmful in infants and the elderly. Oral rehydrating solutions (ORS) are available at pharmacies and grocery stores. ORS replace water and important electrolytes in proper proportions. Sports drinks are not as effective as ORS and may be harmful due to sugars worsening diarrhea.  ORS is especially recommended for use in children with diarrhea. As a general guideline for children, replace any new fluid losses from diarrhea and/or vomiting with ORS as follows:  If your child weighs 22 pounds or under (10 kg or less), give 60-120 mL ( -  cup or 2 - 4 ounces) of ORS for each episode of diarrheal stool or vomiting episode.  If your child weighs more than 22 pounds (more than 10 kgs), give 120-240 mL ( - 1 cup or 4 - 8 ounces) of ORS for each diarrheal stool or episode of vomiting.  While correcting for dehydration, children should eat normally. However, foods high in sugar should be avoided because this may worsen diarrhea. Large amounts of carbonated soft drinks, juice, gelatin desserts and other highly sugared drinks should be avoided.  After correction of dehydration, other liquids that are appealing to the child may be added. Children should drink small amounts of fluids frequently and fluids should be increased as tolerated. Children should drink enough fluids to keep urine clear or pale yellow.  Adults should eat normally while drinking more fluids than  usual. Drink small amounts of fluids frequently and increase as tolerated. Drink enough fluids to keep urine clear or pale yellow. Broths, weak decaffeinated tea, lemon lime soft drinks (allowed to go flat) and ORS replace fluids and electrolytes.  Avoid:  Carbonated drinks.  Juice.  Extremely hot or cold fluids.  Caffeine drinks.  Fatty, greasy foods.  Alcohol.  Tobacco.  Too much intake of anything at one time.  Gelatin desserts.  Probiotics are active cultures of beneficial bacteria. They may lessen the amount and number of diarrheal stools in adults. Probiotics can be found in yogurt with active cultures and in supplements.  Wash hands well to avoid spreading bacteria and virus.  Anti-diarrheal medications are not recommended for infants and children.  Only take over-the-counter or prescription medicines for pain, discomfort or fever as directed by your caregiver. Do not give aspirin to children because it may cause Reye's Syndrome.  For adults, ask your caregiver if you should continue all prescribed and over-the-counter medicines.  If your caregiver has given you a follow-up appointment, it is very important to keep that appointment. Not keeping the appointment could result in a chronic or permanent injury, and disability. If there is any problem keeping the appointment, you must call back to this facility for assistance. SEEK IMMEDIATE MEDICAL CARE IF:   You or your child is unable to keep fluids down or other symptoms or problems become worse in spite of treatment.  Vomiting or diarrhea develops and becomes persistent.  There is vomiting of blood or bile (green material).  There is blood in the stool or the stools are black and   tarry.  There is no urine output in 6-8 hours or there is only a small amount of very dark urine.  Abdominal pain develops, increases or localizes.  You have a fever.  Your baby is older than 3 months with a rectal temperature of 102 F  (38.9 C) or higher.  Your baby is 3 months old or younger with a rectal temperature of 100.4 F (38 C) or higher.  You or your child develops excessive weakness, dizziness, fainting or extreme thirst.  You or your child develops a rash, stiff neck, severe headache or become irritable or sleepy and difficult to awaken. MAKE SURE YOU:   Understand these instructions.  Will watch your condition.  Will get help right away if you are not doing well or get worse. Document Released: 12/02/2002 Document Revised: 03/05/2012 Document Reviewed: 10/19/2009 [employer] Patient Information 2013 [employer].  

## 2012-12-24 NOTE — Progress Notes (Signed)
  Subjective:    Patient ID: Sherri Weber, female    DOB: [birth year], 78 y.o.   MRN: [id number]  Diarrhea  This is a new problem. The current episode started in the past 7 days. The problem occurs 2 to 4 times per day. The problem has been unchanged. The stool consistency is described as watery. The patient states that diarrhea does not awaken her from sleep. Pertinent negatives include no abdominal pain, arthralgias, bloating, chills, coughing, fever, headaches, increased  flatus, myalgias, sweats, URI, vomiting or weight loss. Risk factors include recent antibiotic use. She has tried nothing for the symptoms. Her past medical history is significant for bowel resection.      Review of Systems  Constitutional: Negative for fever, chills, weight loss, diaphoresis, activity change, appetite change, fatigue and unexpected weight change.  HENT: Negative.   Eyes: Negative.   Respiratory: Negative.  Negative for cough.   Cardiovascular: Negative.   Gastrointestinal: Positive for diarrhea. Negative for nausea, vomiting, abdominal pain, constipation, blood in stool, abdominal distention, anal bleeding, rectal pain, bloating and flatus.  Genitourinary: Negative.   Musculoskeletal: Negative for myalgias, back pain, joint swelling, arthralgias and gait problem.  Skin: Negative.   Neurological: Negative.  Negative for headaches.  Hematological: Negative for adenopathy. Does not bruise/bleed easily.  Psychiatric/Behavioral: Negative.        Objective:   Physical Exam  Vitals reviewed. Constitutional: She is oriented to person, place, and time. She appears well-developed and well-nourished.  Non-toxic appearance. She does not have a sickly appearance. She does not appear ill. No distress.  HENT:  Head: Normocephalic and atraumatic.  Mouth/Throat: Oropharynx is clear and moist. No oropharyngeal exudate.  Eyes: Conjunctivae normal are normal. Right eye exhibits no discharge. Left eye exhibits no  discharge. No scleral icterus.  Neck: Normal range of motion. Neck supple. No JVD present. No tracheal deviation present. No thyromegaly present.  Cardiovascular: Normal rate, regular rhythm, normal heart sounds and intact distal pulses.  Exam reveals no gallop and no friction rub.   No murmur heard. Pulmonary/Chest: Effort normal. No stridor. No respiratory distress. She has no wheezes (ostomy sight looks good). She has no rales. She exhibits no tenderness (ostomy sight looks good).  Abdominal: Soft. Bowel sounds are normal. She exhibits no distension and no mass. There is no tenderness. There is no rebound and no guarding.  Musculoskeletal: Normal range of motion. She exhibits no edema and no tenderness.  Lymphadenopathy:    She has no cervical adenopathy.  Neurological: She is oriented to person, place, and time.  Skin: Skin is warm and dry. No rash noted. She is not diaphoretic. No erythema. No pallor.  Psychiatric: She has a normal mood and affect. Her behavior is normal. Judgment and thought content normal.     Lab Results  Component Value Date   WBC 6.5 02/28/2012   HGB 13.8 02/28/2012   HCT 40.4 02/28/2012   PLT 307.0 02/28/2012   GLUCOSE 86 06/05/2012   CHOL 175 04/23/2012   TRIG 183.0* 04/23/2012   HDL 34.50* 04/23/2012   LDLDIRECT 65.0 02/28/2012   LDLCALC 104* 04/23/2012   ALT 15 06/05/2012   AST 21 06/05/2012   NA 144 06/05/2012   K 4.6 06/05/2012   CL 108 06/05/2012   CREATININE 1.0 06/05/2012   BUN 15 06/05/2012   CO2 27 06/05/2012   TSH 2.23 06/05/2012       Assessment & Plan:

## 2012-12-25 ENCOUNTER — Telehealth: Payer: Self-pay | Admitting: Internal Medicine

## 2012-12-25 ENCOUNTER — Other Ambulatory Visit: Payer: Medicare PPO

## 2012-12-25 DIAGNOSIS — A09 Infectious gastroenteritis and colitis, unspecified: Secondary | ICD-10-CM

## 2012-12-25 NOTE — Telephone Encounter (Signed)
Forgot to tell DR at appt yesterday, that her bottom is still hurting, request call back

## 2012-12-26 LAB — FECAL LACTOFERRIN, QUANT: Lactoferrin: POSITIVE

## 2012-12-26 LAB — CLOSTRIDIUM DIFFICILE EIA: CDIFTX: NEGATIVE

## 2012-12-27 LAB — CLOSTRIDIUM DIFFICILE TOXIN

## 2012-12-27 LAB — CLOSTRIDIUM DIFFICILE EIA: CDIFTX: NEGATIVE

## 2012-12-29 LAB — STOOL CULTURE

## 2013-01-02 ENCOUNTER — Telehealth: Payer: Self-pay

## 2013-01-02 DIAGNOSIS — M545 Low back pain, unspecified: Secondary | ICD-10-CM

## 2013-01-02 MED ORDER — HYDROCODONE-ACETAMINOPHEN 5-325 MG PO TABS: 1.0000 | ORAL_TABLET | Freq: Three times a day (TID) | ORAL | Status: DC | PRN

## 2013-01-02 NOTE — Telephone Encounter (Signed)
done

## 2013-01-02 NOTE — Telephone Encounter (Signed)
Received fax from pharmacy stating  Hydrocodone 5-500 is no longer available. Please advise on med change pt takes 1tab every 6hr prn #90. Please also advise if ok to refill tramadol 50 mg also. Thanks

## 2013-01-03 ENCOUNTER — Ambulatory Visit: Payer: Medicare PPO | Admitting: Internal Medicine

## 2013-01-07 ENCOUNTER — Ambulatory Visit: Payer: Medicare PPO | Admitting: Internal Medicine

## 2013-01-07 DIAGNOSIS — Z0289 Encounter for other administrative examinations: Secondary | ICD-10-CM

## 2013-01-09 ENCOUNTER — Ambulatory Visit (INDEPENDENT_AMBULATORY_CARE_PROVIDER_SITE_OTHER): Payer: Medicare PPO | Admitting: Internal Medicine

## 2013-01-09 ENCOUNTER — Encounter: Payer: Self-pay | Admitting: Internal Medicine

## 2013-01-09 VITALS — BP 110/70 | HR 98 | Temp 98.5°F | Resp 16 | Wt 132.0 lb

## 2013-01-09 DIAGNOSIS — Z1231 Encounter for screening mammogram for malignant neoplasm of breast: Secondary | ICD-10-CM | POA: Insufficient documentation

## 2013-01-09 DIAGNOSIS — M545 Low back pain, unspecified: Secondary | ICD-10-CM

## 2013-01-09 DIAGNOSIS — I10 Essential (primary) hypertension: Secondary | ICD-10-CM

## 2013-01-09 MED ORDER — HYDROCODONE-ACETAMINOPHEN 5-325 MG PO TABS: 1.0000 | ORAL_TABLET | Freq: Three times a day (TID) | ORAL | Status: DC | PRN

## 2013-01-09 NOTE — Patient Instructions (Signed)
Back Pain, Adult Low back pain is very common. About 1 in 5 people have back pain.The cause of low back pain is rarely dangerous. The pain often gets better over time.About half of people with a sudden onset of back pain feel better in just 2 weeks. About 8 in 10 people feel better by 6 weeks.  CAUSES Some common causes of back pain include:  Strain of the muscles or ligaments supporting the spine.  Wear and tear (degeneration) of the spinal discs.  Arthritis.  Direct injury to the back. DIAGNOSIS Most of the time, the direct cause of low back pain is not known.However, back pain can be treated effectively even when the exact cause of the pain is unknown.Answering your caregiver's questions about your overall health and symptoms is one of the most accurate ways to make sure the cause of your pain is not dangerous. If your caregiver needs more information, he or she may order lab work or imaging tests (X-rays or MRIs).However, even if imaging tests show changes in your back, this usually does not require surgery. HOME CARE INSTRUCTIONS For many people, back pain returns.Since low back pain is rarely dangerous, it is often a condition that people can learn to manageon their own.   Remain active. It is stressful on the back to sit or stand in one place. Do not sit, drive, or stand in one place for more than 30 minutes at a time. Take short walks on level surfaces as soon as pain allows.Try to increase the length of time you walk each day.  Do not stay in bed.Resting more than 1 or 2 days can delay your recovery.  Do not avoid exercise or work.Your body is made to move.It is not dangerous to be active, even though your back may hurt.Your back will likely heal faster if you return to being active before your pain is gone.  Pay attention to your body when you bend and lift. Many people have less discomfortwhen lifting if they bend their knees, keep the load close to their bodies,and  avoid twisting. Often, the most comfortable positions are those that put less stress on your recovering back.  Find a comfortable position to sleep. Use a firm mattress and lie on your side with your knees slightly bent. If you lie on your back, put a pillow under your knees.  Only take over-the-counter or prescription medicines as directed by your caregiver. Over-the-counter medicines to reduce pain and inflammation are often the most helpful.Your caregiver may prescribe muscle relaxant drugs.These medicines help dull your pain so you can more quickly return to your normal activities and healthy exercise.  Put ice on the injured area.  Put ice in a plastic bag.  Place a towel between your skin and the bag.  Leave the ice on for 15 to 20 minutes, 3 to 4 times a day for the first 2 to 3 days. After that, ice and heat may be alternated to reduce pain and spasms.  Ask your caregiver about trying back exercises and gentle massage. This may be of some benefit.  Avoid feeling anxious or stressed.Stress increases muscle tension and can worsen back pain.It is important to recognize when you are anxious or stressed and learn ways to manage it.Exercise is a great option. SEEK MEDICAL CARE IF:  You have pain that is not relieved with rest or medicine.  You have pain that does not improve in 1 week.  You have new symptoms.  You are generally   not feeling well. SEEK IMMEDIATE MEDICAL CARE IF:   You have pain that radiates from your back into your legs.  You develop new bowel or bladder control problems.  You have unusual weakness or numbness in your arms or legs.  You develop nausea or vomiting.  You develop abdominal pain.  You feel faint. Document Released: 12/12/2005 Document Revised: 06/12/2012 Document Reviewed: 05/02/2011 [employer] Patient Information 2013 [employer].  

## 2013-01-10 NOTE — Assessment & Plan Note (Signed)
She has no radicular s/s She is getting relief from her current meds - will continue the same

## 2013-01-10 NOTE — Progress Notes (Signed)
Subjective:    Patient ID: Sherri Weber, female    DOB: [birth year], 79 y.o.   MRN: [id number]  Back Pain This is a chronic problem. The current episode started more than 1 year ago. The problem occurs intermittently. The problem is unchanged. The pain is present in the lumbar spine. The quality of the pain is described as aching. The pain does not radiate. The pain is at a severity of 6/10. The pain is moderate. The pain is worse during the day. The symptoms are aggravated by bending. Pertinent negatives include no abdominal pain, bladder incontinence, bowel incontinence, chest pain, dysuria, fever, headaches, leg pain, numbness, paresis, paresthesias, pelvic pain, perianal numbness, tingling, weakness or weight loss. Risk factors include obesity and lack of exercise. She has tried analgesics and muscle relaxant for the symptoms. The treatment provided significant relief.      Review of Systems  Constitutional: Negative for fever, chills, weight loss, diaphoresis, activity change, appetite change, fatigue and unexpected weight change.  HENT: Negative.   Eyes: Negative.   Respiratory: Negative for cough, chest tightness, shortness of breath, wheezing and stridor.   Cardiovascular: Negative for chest pain, palpitations and leg swelling.  Gastrointestinal: Negative for nausea, vomiting, abdominal pain, diarrhea, constipation and bowel incontinence.  Genitourinary: Negative.  Negative for bladder incontinence, dysuria and pelvic pain.  Musculoskeletal: Positive for back pain. Negative for myalgias, joint swelling, arthralgias and gait problem.  Skin: Negative for color change, pallor, rash and wound.  Neurological: Negative for dizziness, tingling, tremors, weakness, numbness, headaches and paresthesias.  Hematological: Negative for adenopathy. Does not bruise/bleed easily.  Psychiatric/Behavioral: Negative.        Objective:   Physical Exam  Vitals reviewed. Constitutional: She is  oriented to person, place, and time. She appears well-developed and well-nourished. No distress.  HENT:  Head: Normocephalic and atraumatic.  Mouth/Throat: Oropharynx is clear and moist. No oropharyngeal exudate.  Eyes: Conjunctivae normal are normal. Right eye exhibits no discharge. Left eye exhibits no discharge. No scleral icterus.  Neck: Normal range of motion. Neck supple. No JVD present. No tracheal deviation present. No thyromegaly present.  Cardiovascular: Normal rate, regular rhythm, normal heart sounds and intact distal pulses.  Exam reveals no gallop and no friction rub.   No murmur heard. Pulmonary/Chest: Effort normal and breath sounds normal. No stridor. No respiratory distress. She has no wheezes. She has no rales. She exhibits no tenderness.  Abdominal: Soft. Bowel sounds are normal. She exhibits no distension and no mass. There is no tenderness. There is no rebound and no guarding.  Musculoskeletal: Normal range of motion. She exhibits no edema and no tenderness.       Lumbar back: Normal. She exhibits normal range of motion, no tenderness, no bony tenderness, no swelling, no edema, no deformity, no laceration, no pain, no spasm and normal pulse.  Lymphadenopathy:    She has no cervical adenopathy.  Neurological: She is alert and oriented to person, place, and time. She has normal strength. She displays no atrophy, no tremor and normal reflexes. No cranial nerve deficit or sensory deficit. She exhibits normal muscle tone. She displays a negative Romberg sign. She displays no seizure activity. Coordination and gait normal.  Reflex Scores:      Tricep reflexes are 0 on the right side and 0 on the left side.      Bicep reflexes are 0 on the right side and 0 on the left side.      Brachioradialis reflexes are 0 on  the right side and 0 on the left side.      Patellar reflexes are 0 on the right side and 0 on the left side.      Achilles reflexes are 0 on the right side and 0 on the  left side. Skin: Skin is warm and dry. No rash noted. She is not diaphoretic. No erythema. No pallor.  Psychiatric: She has a normal mood and affect. Her behavior is normal. Judgment and thought content normal.          Assessment & Plan:

## 2013-01-10 NOTE — Assessment & Plan Note (Signed)
Her BP is well controlled 

## 2013-02-07 ENCOUNTER — Ambulatory Visit (HOSPITAL_COMMUNITY): Payer: Medicare PPO

## 2013-02-21 ENCOUNTER — Ambulatory Visit (HOSPITAL_COMMUNITY)
Admission: RE | Admit: 2013-02-21 | Discharge: 2013-02-21 | Disposition: A | Discharge status: Home / Self Care | Payer: Medicare PPO | Source: Ambulatory Visit | Attending: Internal Medicine | Admitting: Internal Medicine

## 2013-02-21 DIAGNOSIS — Z1231 Encounter for screening mammogram for malignant neoplasm of breast: Secondary | ICD-10-CM | POA: Insufficient documentation

## 2013-02-22 LAB — HM MAMMOGRAPHY: HM Mammogram: NORMAL

## 2013-03-06 ENCOUNTER — Ambulatory Visit (INDEPENDENT_AMBULATORY_CARE_PROVIDER_SITE_OTHER): Payer: Medicare PPO | Admitting: Internal Medicine

## 2013-03-06 ENCOUNTER — Encounter: Payer: Self-pay | Admitting: Internal Medicine

## 2013-03-06 VITALS — BP 102/70 | HR 80 | Temp 97.7°F | Resp 16 | Wt 139.0 lb

## 2013-03-06 DIAGNOSIS — I10 Essential (primary) hypertension: Secondary | ICD-10-CM

## 2013-03-06 DIAGNOSIS — K589 Irritable bowel syndrome without diarrhea: Secondary | ICD-10-CM

## 2013-03-06 DIAGNOSIS — G4700 Insomnia, unspecified: Secondary | ICD-10-CM

## 2013-03-06 DIAGNOSIS — M545 Low back pain, unspecified: Secondary | ICD-10-CM

## 2013-03-06 DIAGNOSIS — K9419 Other complications of enterostomy: Secondary | ICD-10-CM

## 2013-03-06 DIAGNOSIS — IMO0002 Reserved for concepts with insufficient information to code with codable children: Secondary | ICD-10-CM

## 2013-03-06 DIAGNOSIS — F329 Major depressive disorder, single episode, unspecified: Secondary | ICD-10-CM

## 2013-03-06 DIAGNOSIS — F3289 Other specified depressive episodes: Secondary | ICD-10-CM

## 2013-03-06 MED ORDER — ZOLPIDEM TARTRATE 10 MG PO TABS: 10.0000 mg | ORAL_TABLET | Freq: Every evening | ORAL | Status: DC | PRN

## 2013-03-06 MED ORDER — ALPRAZOLAM 2 MG PO TABS: 2.0000 mg | ORAL_TABLET | Freq: Three times a day (TID) | ORAL | Status: DC | PRN

## 2013-03-06 NOTE — Assessment & Plan Note (Signed)
She agrees to f/up with her surgeon in [facility]

## 2013-03-06 NOTE — Progress Notes (Signed)
Subjective:    Patient ID: Sherri Weber, female    DOB: [birth year], 79 y.o.   MRN: [id number]  HPI  She returns for f/up and complains of intermittent abd bloating with alternating constipation and diarrhea.  Review of Systems  Constitutional: Positive for unexpected weight change. Negative for fever, chills, diaphoresis, activity change, appetite change and fatigue.  HENT: Negative.   Eyes: Negative.   Cardiovascular: Negative for chest pain, palpitations and leg swelling.  Gastrointestinal: Positive for diarrhea, constipation and abdominal distention. Negative for nausea, vomiting, abdominal pain, anal bleeding and rectal pain.  Endocrine: Negative.   Genitourinary: Negative.   Musculoskeletal: Positive for back pain (chronic,unchanged). Negative for myalgias, joint swelling, arthralgias and gait problem.  Skin: Negative for color change, pallor, rash and wound.  Allergic/Immunologic: Negative.   Neurological: Negative.   Hematological: Negative.   Psychiatric/Behavioral: Positive for sleep disturbance (DFA and FA). Negative for suicidal ideas, hallucinations, behavioral problems, confusion, self-injury, decreased concentration and agitation. The patient is nervous/anxious. The patient is not hyperactive.        Objective:   Physical Exam  Vitals reviewed. Constitutional: She is oriented to person, place, and time. She appears well-developed and well-nourished.  Non-toxic appearance. She does not have a sickly appearance. She does not appear ill. No distress.  HENT:  Head: Normocephalic and atraumatic.  Mouth/Throat: Oropharynx is clear and moist. No oropharyngeal exudate.  Eyes: Conjunctivae are normal. Right eye exhibits no discharge. Left eye exhibits no discharge. No scleral icterus.  Neck: Normal range of motion. Neck supple. No JVD present. No tracheal deviation present. No thyromegaly present.  Cardiovascular: Normal rate, regular rhythm, normal heart sounds and intact  distal pulses.  Exam reveals no gallop and no friction rub.   No murmur heard. Pulmonary/Chest: Effort normal and breath sounds normal. No stridor. No respiratory distress. She has no wheezes. She has no rales. She exhibits no tenderness.  Abdominal: Soft. Normal appearance and bowel sounds are normal. She exhibits no shifting dullness, no distension, no pulsatile liver, no fluid wave, no abdominal bruit, no ascites, no pulsatile midline mass and no mass. There is no hepatosplenomegaly, splenomegaly or hepatomegaly. There is no tenderness. There is no rigidity, no rebound, no guarding, no CVA tenderness, no tenderness at McBurney's point and negative Murphy's sign. No hernia. Hernia confirmed negative in the ventral area.    Musculoskeletal: Normal range of motion. She exhibits no edema and no tenderness.  Lymphadenopathy:    She has no cervical adenopathy.  Neurological: She is oriented to person, place, and time.  Skin: Skin is warm and dry. No rash noted. She is not diaphoretic. No erythema. No pallor.  Psychiatric: Her behavior is normal. Judgment and thought content normal. Her mood appears anxious. Her affect is not angry, not blunt, not labile and not inappropriate. Her speech is tangential. Her speech is not rapid and/or pressured, not delayed and not slurred. Cognition and memory are normal. She does not exhibit a depressed mood. She is communicative.      Lab Results  Component Value Date   WBC 6.5 02/28/2012   HGB 13.8 02/28/2012   HCT 40.4 02/28/2012   PLT 307.0 02/28/2012   GLUCOSE 86 06/05/2012   CHOL 175 04/23/2012   TRIG 183.0* 04/23/2012   HDL 34.50* 04/23/2012   LDLDIRECT 65.0 02/28/2012   LDLCALC 104* 04/23/2012   ALT 15 06/05/2012   AST 21 06/05/2012   Sherri Weber 144 06/05/2012   K 4.6 06/05/2012   CL 108 06/05/2012  CREATININE 1.0 06/05/2012   BUN 15 06/05/2012   CO2 27 06/05/2012   TSH 2.23 06/05/2012      Assessment & Plan:

## 2013-03-06 NOTE — Assessment & Plan Note (Signed)
Continue current meds for pain 

## 2013-03-06 NOTE — Assessment & Plan Note (Signed)
Her BP is well controlled 

## 2013-03-06 NOTE — Assessment & Plan Note (Signed)
Continue current meds 

## 2013-03-06 NOTE — Assessment & Plan Note (Signed)
She does not want to take an additional med for this

## 2013-03-06 NOTE — Patient Instructions (Signed)
Irritable Bowel Syndrome  Irritable Bowel Syndrome (IBS) is caused by a disturbance of normal bowel function. Other terms used are spastic colon, mucous colitis, and irritable colon. It does not require surgery, nor does it lead to cancer. There is no cure for IBS. But with proper diet, stress reduction, and medication, you will find that your problems (symptoms) will gradually disappear or improve. IBS is a common digestive disorder. It usually appears in late adolescence or early adulthood. Women develop it twice as often as men.  CAUSES   After food has been digested and absorbed in the small intestine, waste material is moved into the colon (large intestine). In the colon, water and salts are absorbed from the undigested products coming from the small intestine. The remaining residue, or fecal material, is held for elimination. Under normal circumstances, gentle, rhythmic contractions on the bowel walls push the fecal material along the colon towards the rectum. In IBS, however, these contractions are irregular and poorly coordinated. The fecal material is either retained too long, resulting in constipation, or expelled too soon, producing diarrhea.  SYMPTOMS   The most common symptom of IBS is pain. It is typically in the lower left side of the belly (abdomen). But it may occur anywhere in the abdomen. It can be felt as heartburn, backache, or even as a dull pain in the arms or shoulders. The pain comes from excessive bowel-muscle spasms and from the buildup of gas and fecal material in the colon. This pain:   Can range from sharp belly (abdominal) cramps to a dull, continuous ache.   Usually worsens soon after eating.   Is typically relieved by having a bowel movement or passing gas.  Abdominal pain is usually accompanied by constipation. But it may also produce diarrhea. The diarrhea typically occurs right after a meal or upon arising in the morning. The stools are typically soft and watery. They are often  flecked with secretions (mucus).  Other symptoms of IBS include:   Bloating.   Loss of appetite.   Heartburn.   Feeling sick to your stomach (nausea).   Belching   Vomiting   Gas.  IBS may also cause a number of symptoms that are unrelated to the digestive system:   Fatigue.   Headaches.   Anxiety   Shortness of breath   Difficulty in concentrating.   Dizziness.  These symptoms tend to come and go.  DIAGNOSIS   The symptoms of IBS closely mimic the symptoms of other, more serious digestive disorders. So your caregiver may wish to perform a variety of additional tests to exclude these disorders. He/she wants to be certain of learning what is wrong (diagnosis). The nature and purpose of each test will be explained to you.  TREATMENT  A number of medications are available to help correct bowel function and/or relieve bowel spasms and abdominal pain. Among the drugs available are:   Mild, non-irritating laxatives for severe constipation and to help restore normal bowel habits.   Specific anti-diarrheal medications to treat severe or prolonged diarrhea.   Anti-spasmodic agents to relieve intestinal cramps.   Your caregiver may also decide to treat you with a mild tranquilizer or sedative during unusually stressful periods in your life.  The important thing to remember is that if any drug is prescribed for you, make sure that you take it exactly as directed. Make sure that your caregiver knows how well it worked for you.  HOME CARE INSTRUCTIONS    Avoid foods that   are high in fat or oils. Some examples are:heavy cream, butter, frankfurters, sausage, and other fatty meats.   Avoid foods that have a laxative effect, such as fruit, fruit juice, and dairy products.   Cut out carbonated drinks, chewing gum, and "gassy" foods, such as beans and cabbage. This may help relieve bloating and belching.   Bran taken with plenty of liquids may help relieve constipation.   Keep track of what foods seem to trigger  your symptoms.   Avoid emotionally charged situations or circumstances that produce anxiety.   Start or continue exercising.   Get plenty of rest and sleep.  MAKE SURE YOU:    Understand these instructions.   Will watch your condition.   Will get help right away if you are not doing well or get worse.  Document Released: 12/12/2005 Document Revised: 03/05/2012 Document Reviewed: 08/01/2008  [employer] Patient Information 2013 [employer].

## 2013-03-27 ENCOUNTER — Ambulatory Visit (INDEPENDENT_AMBULATORY_CARE_PROVIDER_SITE_OTHER): Payer: Medicare HMO | Admitting: General Surgery

## 2013-03-27 ENCOUNTER — Encounter (INDEPENDENT_AMBULATORY_CARE_PROVIDER_SITE_OTHER): Payer: Self-pay | Admitting: General Surgery

## 2013-03-27 VITALS — BP 112/62 | HR 93 | Temp 97.6°F | Resp 18 | Ht 60.0 in | Wt 137.4 lb

## 2013-03-27 DIAGNOSIS — Z433 Encounter for attention to colostomy: Secondary | ICD-10-CM | POA: Insufficient documentation

## 2013-03-27 NOTE — Progress Notes (Addendum)
Patient ID: Sherri Weber, female   DOB: 24-Nov-1933, 79 y.o.   MRN: [id number]  Chief Complaint  Patient presents with  . New Evaluation    hx colostomy/sx at [facility]    HPI Sherri Weber is a 79 y.o. female.   HPI  She is referred by Dr. Henderson Cloud for assistance with colostomy care. She underwent a colostomy by Dr. Juluis Mire because of significant pelvic floor dysfunction which was causing incontinence and severe pain.  This was last year. She has been significantly better since that time. She lives in [place] and wanted to see somebody regarding her colostomy in case she had any problems and has been referred for that. She states that she is comfortable caring for her colostomy. She has gained some weight. She notices a fullness around the colostomy when she gets up from a lying position.  Past Medical History  Diagnosis Date  . Depressive disorder, not elsewhere classified   . Diarrhea   . Other malaise and fatigue   . Abdominal distension   . Hemorrhoids   . Chronic constipation   . Family history of malignant neoplasm of gastrointestinal tract   . Rectal prolapse   . Ulcer of anus and rectum   . Irritable bowel syndrome   . Low back pain   . Osteoarthritis     Past Surgical History  Procedure Laterality Date  . Abdominal hysterectomy    . Cholecystectomy    . Appendectomy    . Hemicolectomy    . Cecectomy    . Colon surgery    . Colostomy      Family History  Problem Relation Age of Onset  . Diabetes Sister   . Stroke Sister   . Mental retardation Maternal Uncle   . Emphysema Mother   . Stroke Father   . Cancer Sister     Colon    Social History History  Substance Use Topics  . Smoking status: Never Smoker   . Smokeless tobacco: Never Used  . Alcohol Use: Yes     Comment: Social.    No Known Allergies  Current Outpatient Prescriptions  Medication Sig Dispense Refill  . alprazolam (XANAX) 2 MG tablet Take 1 tablet (2 mg total) by mouth  3 (three) times daily as needed.  65 tablet  5  . cyclobenzaprine (FLEXERIL) 10 MG tablet Take 1 tablet (10 mg total) by mouth 2 (two) times daily as needed for muscle spasms.  60 tablet  11  . dexlansoprazole (DEXILANT) 60 MG capsule Take 60 mg by mouth daily.      Marland Kitchen HYDROcodone-acetaminophen (NORCO/VICODIN) 5-325 MG per tablet Take 1 tablet by mouth every 8 (eight) hours as needed for pain.  90 tablet  3  . oxybutynin (DITROPAN-XL) 10 MG 24 hr tablet Take 1 tablet (10 mg total) by mouth daily.  90 tablet  3  . promethazine (PHENERGAN) 12.5 MG tablet Take 1 tablet (12.5 mg total) by mouth every 6 (six) hours as needed.  30 tablet  3  . sertraline (ZOLOFT) 50 MG tablet Take 1 tablet (50 mg total) by mouth daily.  30 tablet  11  . Wheat Dextrin (BENEFIBER PO) Take by mouth.        . zolpidem (AMBIEN) 10 MG tablet Take 1 tablet (10 mg total) by mouth at bedtime as needed.  30 tablet  5   No current facility-administered medications for this visit.    Review of Systems Review of Systems  Gastrointestinal:  Positive for nausea.  Neurological:       Intermittent dizziness.    Blood pressure 112/62, pulse 93, temperature 97.6 F (36.4 C), temperature source Temporal, resp. rate 18, height 5' (1.524 m), weight 137 lb 6.4 oz (62.324 kg).  Physical Exam Physical Exam  Constitutional: She appears well-developed and well-nourished. No distress.  HENT:  Mucous membranes are dry.  Abdominal: Soft. There is no tenderness.  Left-sided colostomy that is pink. No significant peristomal irritation of the skin. No obvious peristomal hernia.    Data Reviewed Notes from Dr. Henderson Cloud, Dr. Emogene Morgan.  Assessment    Status post colostomy-she seems to be doing well with this. No evidence of parastomal hernia.     Plan    I've told her to avoid weight gain.   We would be glad to see her back for any problems she has with the colostomy.       Lakendra Helling J 03/27/2013, 11:51 AM

## 2013-03-27 NOTE — Patient Instructions (Signed)
Avoid weight gain.  Avoid a lot of heavy lifting.

## 2013-04-01 ENCOUNTER — Other Ambulatory Visit: Payer: Self-pay | Admitting: Internal Medicine

## 2013-04-17 ENCOUNTER — Telehealth (INDEPENDENT_AMBULATORY_CARE_PROVIDER_SITE_OTHER): Payer: Self-pay | Admitting: *Deleted

## 2013-04-17 NOTE — Telephone Encounter (Signed)
Patient called to state that she is having vomiting.  Patient was prescribed Phenergan 12.5mg  by her PMD Dr. Yetta Barre which patient states is not helping.  Patient is wanting Dr. Arne Cleveland opinion on the vomiting.

## 2013-04-18 NOTE — Telephone Encounter (Signed)
As long as her colostomy is functioning, then she does not have a bowel obstruction.  If that is the case, she should be evaluated by her PCP and/or a gastroenterologist.  If she is vomiting and not having colostomy output, she should go to the ED for evaluation.

## 2013-04-18 NOTE — Telephone Encounter (Signed)
Called and left message for patient to call back to give her information from Dr. Abbey Chatters.

## 2013-04-24 ENCOUNTER — Ambulatory Visit (INDEPENDENT_AMBULATORY_CARE_PROVIDER_SITE_OTHER): Payer: Medicare HMO | Admitting: General Surgery

## 2013-04-25 ENCOUNTER — Ambulatory Visit (INDEPENDENT_AMBULATORY_CARE_PROVIDER_SITE_OTHER): Payer: Medicare PPO | Admitting: Internal Medicine

## 2013-04-25 ENCOUNTER — Encounter: Payer: Self-pay | Admitting: Internal Medicine

## 2013-04-25 VITALS — BP 118/72 | HR 72 | Temp 97.3°F | Ht 60.0 in | Wt 133.4 lb

## 2013-04-25 DIAGNOSIS — F439 Reaction to severe stress, unspecified: Secondary | ICD-10-CM

## 2013-04-25 DIAGNOSIS — Z639 Problem related to primary support group, unspecified: Secondary | ICD-10-CM

## 2013-04-25 DIAGNOSIS — F411 Generalized anxiety disorder: Secondary | ICD-10-CM

## 2013-04-25 DIAGNOSIS — R112 Nausea with vomiting, unspecified: Secondary | ICD-10-CM

## 2013-04-25 MED ORDER — PROMETHAZINE HCL 12.5 MG PO TABS: ORAL_TABLET | ORAL | Status: DC

## 2013-04-25 NOTE — Progress Notes (Signed)
Subjective:    Patient ID: Sherri Weber, female    DOB: July 31, 1933, 79 y.o.   MRN: [id number]  HPI  Pt presents to the clinic today with c/o nausea, and vomitng. This occurred 2 weeks ago. She does have a history of IBS and does have an ostomy. The nausea and vomiting is chronic but she feels like it is being exacerbated by family problems. She does have a lot of anxiety and does take xanax for that. She has not been around people with similar symptoms. She denies fever chills or body ahes. She has taken phenergan for the nausea and she does need a refill of that. Additionally today, pt would like a refill of Tramadol. She has not been on this since January. She is on Norco. She is about to have her teet pulled in preparation for dentures on Monday morning. She is almost out of her Norco and cant have it refilled til May 15.  Review of Systems      Past Medical History  Diagnosis Date  . Depressive disorder, not elsewhere classified   . Diarrhea   . Other malaise and fatigue   . Abdominal distension   . Hemorrhoids   . Chronic constipation   . Family history of malignant neoplasm of gastrointestinal tract   . Rectal prolapse   . Ulcer of anus and rectum   . Irritable bowel syndrome   . Low back pain   . Osteoarthritis     Current Outpatient Prescriptions  Medication Sig Dispense Refill  . alprazolam (XANAX) 2 MG tablet Take 1 tablet (2 mg total) by mouth 3 (three) times daily as needed.  65 tablet  5  . cyclobenzaprine (FLEXERIL) 10 MG tablet Take 1 tablet (10 mg total) by mouth 2 (two) times daily as needed for muscle spasms.  60 tablet  11  . dexlansoprazole (DEXILANT) 60 MG capsule Take 60 mg by mouth daily.      Marland Kitchen HYDROcodone-acetaminophen (NORCO/VICODIN) 5-325 MG per tablet Take 1 tablet by mouth every 8 (eight) hours as needed for pain.  90 tablet  3  . oxybutynin (DITROPAN-XL) 10 MG 24 hr tablet Take 1 tablet (10 mg total) by mouth daily.  90 tablet  3  . promethazine  (PHENERGAN) 12.5 MG tablet TAKE ONE TABLET BY MOUTH EVERY 6 HOURS AS NEEDED  30 tablet  0  . sertraline (ZOLOFT) 50 MG tablet Take 1 tablet (50 mg total) by mouth daily.  30 tablet  11  . Wheat Dextrin (BENEFIBER PO) Take by mouth.        . zolpidem (AMBIEN) 10 MG tablet Take 1 tablet (10 mg total) by mouth at bedtime as needed.  30 tablet  5   No current facility-administered medications for this visit.    No Known Allergies  Family History  Problem Relation Age of Onset  . Diabetes Sister   . Stroke Sister   . Mental retardation Maternal Uncle   . Emphysema Mother   . Stroke Father   . Cancer Sister     Colon    History   Social History  . Marital Status: Divorced    Spouse Name: N/A    Number of Children: N/A  . Years of Education: N/A   Occupational History  . Not on file.   Social History Main Topics  . Smoking status: Never Smoker   . Smokeless tobacco: Never Used  . Alcohol Use: Yes     Comment: Social.  .  Drug Use: No  . Sexually Active: Not Currently   Other Topics Concern  . Not on file   Social History Narrative  . No narrative on file     Constitutional: Denies fever, malaise, fatigue, headache or abrupt weight changes.   Gastrointestinal: Pt reports nausea, vomiting. Denies abdominal pain, bloating, constipation, diarrhea or blood in the stool.    No other specific complaints in a complete review of systems (except as listed in HPI above).  Objective:   Physical Exam   BP 118/72  Pulse 72  Temp(Src) 97.3 F (36.3 C) (Oral)  Ht 5' (1.524 m)  Wt 133 lb 6.4 oz (60.51 kg)  BMI 26.05 kg/m2  SpO2 95% Wt Readings from Last 3 Encounters:  04/25/13 133 lb 6.4 oz (60.51 kg)  03/27/13 137 lb 6.4 oz (62.324 kg)  03/06/13 139 lb (63.05 kg)    General: Appears her stated age, well developed, well nourished in NAD.Marland Kitchen  Cardiovascular: Normal rate and rhythm. S1,S2 noted.  No murmur, rubs or gallops noted. No JVD or BLE edema. No carotid bruits  noted. Pulmonary/Chest: Normal effort and positive vesicular breath sounds. No respiratory distress. No wheezes, rales or ronchi noted.  Abdomen: Soft and nontender. Normal bowel sounds, no bruits noted. No distention or masses noted. Liver, spleen and kidneys non palpable. Ostomy present.         Assessment & Plan:   Nausea and vomiting, chronic but exacerbated by family issues:  Refilled phenergan today Continue all current meds Pt declined referral for CBT today  Declined to give patient refill for tramadol as she is already on Norco  RTC as needed

## 2013-04-25 NOTE — Patient Instructions (Signed)
Nausea and Vomiting Nausea is a sick feeling that often comes before throwing up (vomiting). Vomiting is a reflex where stomach contents come out of your mouth. Vomiting can cause severe loss of body fluids (dehydration). Children and elderly adults can become dehydrated quickly, especially if they also have diarrhea. Nausea and vomiting are symptoms of a condition or disease. It is important to find the cause of your symptoms. CAUSES   Direct irritation of the stomach lining. This irritation can result from increased acid production (gastroesophageal reflux disease), infection, food poisoning, taking certain medicines (such as nonsteroidal anti-inflammatory drugs), alcohol use, or tobacco use.  Signals from the brain.These signals could be caused by a headache, heat exposure, an inner ear disturbance, increased pressure in the brain from injury, infection, a tumor, or a concussion, pain, emotional stimulus, or metabolic problems.  An obstruction in the gastrointestinal tract (bowel obstruction).  Illnesses such as diabetes, hepatitis, gallbladder problems, appendicitis, kidney problems, cancer, sepsis, atypical symptoms of a heart attack, or eating disorders.  Medical treatments such as chemotherapy and radiation.  Receiving medicine that makes you sleep (general anesthetic) during surgery. DIAGNOSIS Your caregiver may ask for tests to be done if the problems do not improve after a few days. Tests may also be done if symptoms are severe or if the reason for the nausea and vomiting is not clear. Tests may include:  Urine tests.  Blood tests.  Stool tests.  Cultures (to look for evidence of infection).  X-rays or other imaging studies. Test results can help your caregiver make decisions about treatment or the need for additional tests. TREATMENT You need to stay well hydrated. Drink frequently but in small amounts.You may wish to drink water, sports drinks, clear broth, or eat frozen  ice pops or gelatin dessert to help stay hydrated.When you eat, eating slowly may help prevent nausea.There are also some antinausea medicines that may help prevent nausea. HOME CARE INSTRUCTIONS   Take all medicine as directed by your caregiver.  If you do not have an appetite, do not force yourself to eat. However, you must continue to drink fluids.  If you have an appetite, eat a normal diet unless your caregiver tells you differently.  Eat a variety of complex carbohydrates (rice, wheat, potatoes, bread), lean meats, yogurt, fruits, and vegetables.  Avoid high-fat foods because they are more difficult to digest.  Drink enough water and fluids to keep your urine clear or pale yellow.  If you are dehydrated, ask your caregiver for specific rehydration instructions. Signs of dehydration may include:  Severe thirst.  Dry lips and mouth.  Dizziness.  Dark urine.  Decreasing urine frequency and amount.  Confusion.  Rapid breathing or pulse. SEEK IMMEDIATE MEDICAL CARE IF:   You have blood or brown flecks (like coffee grounds) in your vomit.  You have black or bloody stools.  You have a severe headache or stiff neck.  You are confused.  You have severe abdominal pain.  You have chest pain or trouble breathing.  You do not urinate at least once every 8 hours.  You develop cold or clammy skin.  You continue to vomit for longer than 24 to 48 hours.  You have a fever. MAKE SURE YOU:   Understand these instructions.  Will watch your condition.  Will get help right away if you are not doing well or get worse. Document Released: 12/12/2005 Document Revised: 03/05/2012 Document Reviewed: 05/11/2011 [employer] Patient Information 2013 [employer].  

## 2013-04-25 NOTE — Progress Notes (Signed)
  Subjective:    Patient ID: Sherri Weber, female    DOB: March 09, 1933, 79 y.o.   MRN: [id number]  HPI  Patient is here for a refill of her Nausea pill (promethazine).  It was severe 2 weeks ago after her daughter called her.  Feels like family problems are increasing nausea and vomiting.      Wants tramadol because she is getting the caps pulled, and is going to get dentures on Monday, May 5th.     Review of Systems  Gastrointestinal: Positive for nausea and vomiting. Negative for diarrhea.  Psychiatric/Behavioral: The patient is nervous/anxious.   All other systems reviewed and are negative.       Objective:   Physical Exam  Nursing note and vitals reviewed. Constitutional: She is oriented to person, place, and time. She appears well-developed and well-nourished. No distress.  HENT:  Head: Normocephalic and atraumatic.  Eyes: Conjunctivae are normal. No scleral icterus.  Neck: Normal range of motion. Neck supple.  Cardiovascular: Normal rate, regular rhythm and normal heart sounds.   Pulmonary/Chest: Effort normal and breath sounds normal. No respiratory distress. She has no wheezes. She has no rales. She exhibits no tenderness.  Abdominal: Soft. Bowel sounds are normal. She exhibits no distension. There is no tenderness. There is no rebound and no guarding.  Ostomy site in LLQ, clean and well maintained.  Neurological: She is alert and oriented to person, place, and time.  Skin: Skin is warm and dry.  Psychiatric: She has a normal mood and affect. Her behavior is normal.          Assessment & Plan:  Patient here for refill of promethazine for nausea  Past medical history has been review.  Nausea is a chronic problem.  Promethazine refilled today.    Return precautions discussed.  Patient to call if symptoms not resolved in several days, sooner if worse.

## 2013-04-28 ENCOUNTER — Emergency Department (HOSPITAL_COMMUNITY): Payer: Medicare PPO

## 2013-04-28 ENCOUNTER — Inpatient Hospital Stay (HOSPITAL_COMMUNITY)
Admission: EM | Admit: 2013-04-28 | Discharge: 2013-05-01 | DRG: 470 | Disposition: A | Discharge status: Skilled Nursing Facility | Payer: Medicare PPO | Attending: Internal Medicine | Admitting: Internal Medicine

## 2013-04-28 ENCOUNTER — Encounter (HOSPITAL_COMMUNITY): Payer: Self-pay | Admitting: Emergency Medicine

## 2013-04-28 DIAGNOSIS — I10 Essential (primary) hypertension: Secondary | ICD-10-CM | POA: Diagnosis present

## 2013-04-28 DIAGNOSIS — S72009A Fracture of unspecified part of neck of unspecified femur, initial encounter for closed fracture: Secondary | ICD-10-CM

## 2013-04-28 DIAGNOSIS — W010XXA Fall on same level from slipping, tripping and stumbling without subsequent striking against object, initial encounter: Secondary | ICD-10-CM | POA: Diagnosis present

## 2013-04-28 DIAGNOSIS — S72002D Fracture of unspecified part of neck of left femur, subsequent encounter for closed fracture with routine healing: Secondary | ICD-10-CM

## 2013-04-28 DIAGNOSIS — Z933 Colostomy status: Secondary | ICD-10-CM

## 2013-04-28 DIAGNOSIS — F3289 Other specified depressive episodes: Secondary | ICD-10-CM | POA: Diagnosis present

## 2013-04-28 DIAGNOSIS — N3281 Overactive bladder: Secondary | ICD-10-CM

## 2013-04-28 DIAGNOSIS — F411 Generalized anxiety disorder: Secondary | ICD-10-CM | POA: Diagnosis present

## 2013-04-28 DIAGNOSIS — E785 Hyperlipidemia, unspecified: Secondary | ICD-10-CM | POA: Diagnosis present

## 2013-04-28 DIAGNOSIS — Y92009 Unspecified place in unspecified non-institutional (private) residence as the place of occurrence of the external cause: Secondary | ICD-10-CM

## 2013-04-28 DIAGNOSIS — E538 Deficiency of other specified B group vitamins: Secondary | ICD-10-CM | POA: Diagnosis present

## 2013-04-28 DIAGNOSIS — F329 Major depressive disorder, single episode, unspecified: Secondary | ICD-10-CM

## 2013-04-28 DIAGNOSIS — N318 Other neuromuscular dysfunction of bladder: Secondary | ICD-10-CM | POA: Diagnosis present

## 2013-04-28 DIAGNOSIS — M545 Low back pain, unspecified: Secondary | ICD-10-CM

## 2013-04-28 DIAGNOSIS — S72002A Fracture of unspecified part of neck of left femur, initial encounter for closed fracture: Secondary | ICD-10-CM

## 2013-04-28 DIAGNOSIS — M1990 Unspecified osteoarthritis, unspecified site: Secondary | ICD-10-CM

## 2013-04-28 DIAGNOSIS — G4700 Insomnia, unspecified: Secondary | ICD-10-CM | POA: Diagnosis present

## 2013-04-28 DIAGNOSIS — Z79899 Other long term (current) drug therapy: Secondary | ICD-10-CM

## 2013-04-28 DIAGNOSIS — D519 Vitamin B12 deficiency anemia, unspecified: Secondary | ICD-10-CM

## 2013-04-28 DIAGNOSIS — S72002J Fracture of unspecified part of neck of left femur, subsequent encounter for open fracture type IIIA, IIIB, or IIIC with delayed healing: Secondary | ICD-10-CM

## 2013-04-28 DIAGNOSIS — S72002P Fracture of unspecified part of neck of left femur, subsequent encounter for closed fracture with malunion: Secondary | ICD-10-CM

## 2013-04-28 DIAGNOSIS — D72829 Elevated white blood cell count, unspecified: Secondary | ICD-10-CM | POA: Diagnosis present

## 2013-04-28 LAB — TYPE AND SCREEN
ABO/RH(D): A POS
Antibody Screen: NEGATIVE

## 2013-04-28 LAB — CBC
HCT: 35.9 % — ABNORMAL LOW (ref 36.0–46.0)
Hemoglobin: 12.9 g/dL (ref 12.0–15.0)
MCH: 31.5 pg (ref 26.0–34.0)
MCHC: 35.9 g/dL (ref 30.0–36.0)
MCV: 87.6 fL (ref 78.0–100.0)
Platelets: 246 10*3/uL (ref 150–400)
RBC: 4.1 MIL/uL (ref 3.87–5.11)
RDW: 12.4 % (ref 11.5–15.5)
WBC: 14.2 10*3/uL — ABNORMAL HIGH (ref 4.0–10.5)

## 2013-04-28 LAB — BASIC METABOLIC PANEL
BUN: 15 mg/dL (ref 6–23)
CO2: 26 mEq/L (ref 19–32)
Calcium: 8.7 mg/dL (ref 8.4–10.5)
Chloride: 101 mEq/L (ref 96–112)
Creatinine, Ser: 0.84 mg/dL (ref 0.50–1.10)
GFR calc Af Amer: 76 mL/min — ABNORMAL LOW (ref >90)
GFR calc non Af Amer: 65 mL/min — ABNORMAL LOW (ref >90)
Glucose, Bld: 93 mg/dL (ref 70–99)
Potassium: 3.8 mEq/L (ref 3.5–5.1)
Sodium: 136 mEq/L (ref 135–145)

## 2013-04-28 LAB — PROTIME-INR
INR: 0.95 (ref 0.00–1.49)
Prothrombin Time: 12.6 seconds (ref 11.6–15.2)

## 2013-04-28 LAB — ABO/RH: ABO/RH(D): A POS

## 2013-04-28 MED ORDER — PANTOPRAZOLE SODIUM 40 MG PO TBEC
80.0000 mg | DELAYED_RELEASE_TABLET | Freq: Every day | ORAL | Status: DC
  Administered 2013-04-30 – 2013-05-01 (×2): 80 mg via ORAL
  Filled 2013-04-28 (×3): qty 2

## 2013-04-28 MED ORDER — HYDROMORPHONE HCL PF 1 MG/ML IJ SOLN
0.5000 mg | INTRAMUSCULAR | Status: DC | PRN
  Administered 2013-04-28 – 2013-04-30 (×9): 0.5 mg via INTRAVENOUS
  Filled 2013-04-28 (×7): qty 1

## 2013-04-28 MED ORDER — MORPHINE SULFATE 4 MG/ML IJ SOLN
6.0000 mg | Freq: Once | INTRAMUSCULAR | Status: AC
  Administered 2013-04-28: 6 mg via INTRAVENOUS
  Filled 2013-04-28: qty 2

## 2013-04-28 MED ORDER — PANTOPRAZOLE SODIUM 40 MG PO TBEC: 40.0000 mg | DELAYED_RELEASE_TABLET | Freq: Every day | ORAL | Status: DC

## 2013-04-28 MED ORDER — ZOLPIDEM TARTRATE 5 MG PO TABS
5.0000 mg | ORAL_TABLET | Freq: Every evening | ORAL | Status: DC | PRN
  Administered 2013-04-28 – 2013-04-29 (×2): 5 mg via ORAL
  Filled 2013-04-28 (×2): qty 1

## 2013-04-28 MED ORDER — OXYBUTYNIN CHLORIDE ER 10 MG PO TB24
10.0000 mg | ORAL_TABLET | Freq: Every day | ORAL | Status: DC
  Administered 2013-04-28 – 2013-05-01 (×3): 10 mg via ORAL
  Filled 2013-04-28 (×4): qty 1

## 2013-04-28 MED ORDER — SERTRALINE HCL 50 MG PO TABS
50.0000 mg | ORAL_TABLET | Freq: Every day | ORAL | Status: DC
  Administered 2013-04-30 – 2013-05-01 (×2): 50 mg via ORAL
  Filled 2013-04-28 (×3): qty 1

## 2013-04-28 MED ORDER — ALPRAZOLAM 1 MG PO TABS
2.0000 mg | ORAL_TABLET | Freq: Three times a day (TID) | ORAL | Status: DC | PRN
  Administered 2013-04-28 – 2013-04-29 (×2): 2 mg via ORAL
  Filled 2013-04-28 (×2): qty 2

## 2013-04-28 MED ORDER — SODIUM CHLORIDE 0.9 % IV BOLUS (SEPSIS)
1000.0000 mL | Freq: Once | INTRAVENOUS | Status: AC
  Administered 2013-04-28: 1000 mL via INTRAVENOUS

## 2013-04-28 MED ORDER — HEPARIN SODIUM (PORCINE) 5000 UNIT/ML IJ SOLN
5000.0000 [IU] | Freq: Three times a day (TID) | INTRAMUSCULAR | Status: DC
  Administered 2013-04-28 – 2013-05-01 (×8): 5000 [IU] via SUBCUTANEOUS
  Filled 2013-04-28 (×11): qty 1

## 2013-04-28 MED ORDER — PROMETHAZINE HCL 25 MG PO TABS
25.0000 mg | ORAL_TABLET | Freq: Four times a day (QID) | ORAL | Status: DC | PRN
  Administered 2013-04-28 – 2013-04-30 (×5): 25 mg via ORAL
  Filled 2013-04-28 (×5): qty 1

## 2013-04-28 MED ORDER — SODIUM CHLORIDE 0.9 % IV SOLN
INTRAVENOUS | Status: DC
  Administered 2013-04-29 – 2013-04-30 (×3): via INTRAVENOUS

## 2013-04-28 MED ORDER — ZOLPIDEM TARTRATE 10 MG PO TABS: 10.0000 mg | ORAL_TABLET | Freq: Every evening | ORAL | Status: DC | PRN

## 2013-04-28 NOTE — ED Notes (Signed)
Per Ems: The patient reports that she fell. The patient states that she has pain at her left groin

## 2013-04-28 NOTE — ED Notes (Signed)
Medications given to Eustace Pen RN and informed Art therapist pending

## 2013-04-28 NOTE — ED Provider Notes (Addendum)
History     CSN: [id number]  Arrival date & time 04/28/13  1428   First MD Initiated Contact with Patient 04/28/13 1520      Chief Complaint  Patient presents with  . Fall  . Groin Pain    HPI The patient reports tripping and falling today in her house.  She landed on her left buttock.  She presents with left hip pain.  Her pain is moderate to severe at this time.  Pain is worsened by palpation and range of motion of her left hip.  No neck pain.  No head injury.  No use of anticoagulants.  No chest pain or shortness of breath.  No abdominal pain.  No back pain.  No weakness of her upper lower extremities.   Past Medical History  Diagnosis Date  . Depressive disorder, not elsewhere classified   . Diarrhea   . Other malaise and fatigue   . Abdominal distension   . Hemorrhoids   . Chronic constipation   . Family history of malignant neoplasm of gastrointestinal tract   . Rectal prolapse   . Ulcer of anus and rectum   . Irritable bowel syndrome   . Low back pain   . Osteoarthritis     Past Surgical History  Procedure Laterality Date  . Abdominal hysterectomy    . Cholecystectomy    . Appendectomy    . Hemicolectomy    . Cecectomy    . Colon surgery    . Colostomy      Family History  Problem Relation Age of Onset  . Diabetes Sister   . Stroke Sister   . Mental retardation Maternal Uncle   . Emphysema Mother   . Stroke Father   . Cancer Sister     Colon    History  Substance Use Topics  . Smoking status: Never Smoker   . Smokeless tobacco: Never Used  . Alcohol Use: Yes     Comment: Social.    OB History   Grav Para Term Preterm Abortions TAB SAB Ect Mult Living                  Review of Systems  All other systems reviewed and are negative.    Allergies  Review of patient's allergies indicates no known allergies.  Home Medications   Current Outpatient Rx  Name  Route  Sig  Dispense  Refill  . alprazolam (XANAX) 2 MG tablet   Oral   Take 2  mg by mouth 3 (three) times daily as needed for anxiety.         . cyclobenzaprine (FLEXERIL) 10 MG tablet   Oral   Take 1 tablet (10 mg total) by mouth 2 (two) times daily as needed for muscle spasms.   60 tablet   11   . dexlansoprazole (DEXILANT) 60 MG capsule   Oral   Take 60 mg by mouth every evening.          Marland Kitchen HYDROcodone-acetaminophen (NORCO/VICODIN) 5-325 MG per tablet   Oral   Take 1 tablet by mouth every 8 (eight) hours as needed for pain.   90 tablet   3   . oxybutynin (DITROPAN-XL) 10 MG 24 hr tablet   Oral   Take 10 mg by mouth every evening.         . promethazine (PHENERGAN) 25 MG tablet   Oral   Take 25 mg by mouth every 6 (six) hours as needed for nausea.         Marland Kitchen  sertraline (ZOLOFT) 50 MG tablet   Oral   Take 50 mg by mouth every evening.         . zolpidem (AMBIEN) 10 MG tablet   Oral   Take 10 mg by mouth at bedtime as needed for sleep.           BP 148/69  Pulse 83  Temp(Src) 98 F (36.7 C) (Oral)  Resp 16  Ht 5\' 5"  (1.651 m)  Wt 133 lb (60.328 kg)  BMI 22.13 kg/m2  SpO2 98%  Physical Exam  Nursing note and vitals reviewed. Constitutional: She is oriented to person, place, and time. She appears well-developed and well-nourished. No distress.  HENT:  Head: Normocephalic and atraumatic.  Eyes: EOM are normal.  Neck: Normal range of motion.  Cardiovascular: Normal rate, regular rhythm and normal heart sounds.   Pulmonary/Chest: Effort normal and breath sounds normal.  Abdominal: Soft. She exhibits no distension. There is no tenderness.  Musculoskeletal: Normal range of motion.  Neurological: She is alert and oriented to person, place, and time.  Skin: Skin is warm and dry.  Psychiatric: She has a normal mood and affect. Judgment normal.    ED Course  Procedures (including critical care time)  Labs Reviewed  CBC  BASIC METABOLIC PANEL  PROTIME-INR  TYPE AND SCREEN   Dg Chest 1 View  04/28/2013  *RADIOLOGY REPORT*   Clinical Data: Head fracture.  Preoperative evaluation.  CHEST - 1 VIEW  Comparison: Report dated 05/10/2000.  Findings: Normal sized heart.  Minimally elevated left hemidiaphragm.  Small medial right diaphragmatic eventration.  Mild right lower lobe bronchiectasis.  Small amount of linear density at the left lung base.  Unremarkable bones.  IMPRESSION:  1.  Small amount of linear atelectasis or scarring at the left lung base. 2.  Mild right lower lobe bronchiectasis.   Original Report Authenticated By: Beckie Salts, M.D.    Dg Hip Complete Left  04/28/2013  *RADIOLOGY REPORT*  Clinical Data: Fall, left growing pain, hip pain.  LEFT HIP - COMPLETE 2+ VIEW  Comparison: 06/05/2012  Findings: There is a left femoral neck fracture with varus angulation and impaction.  No subluxation or dislocation.  Early degenerative changes in the hips bilaterally.  SI joints are symmetric and unremarkable.  IMPRESSION: Left femoral neck fracture with impaction and varus angulation.   Original Report Authenticated By: Charlett Nose, M.D.    I personally reviewed the imaging tests through PACS system I reviewed available ER/hospitalization records through the EMR   1. Hip fracture, left, closed, initial encounter       MDM  Left femoral neck fracture.  Hospitalist admission.  Orthopedic consultation.  N.p.o.   Ortho: Dr Elliot Gurney, MD 04/28/13 1804   Date: 04/28/2013  Rate: 97  Rhythm: normal sinus rhythm  QRS Axis: normal  Intervals: normal  ST/T Wave abnormalities: normal  Conduction Disutrbances: none  Narrative Interpretation:   Old EKG Reviewed: No significant changes noted     Lyanne Co, MD 04/28/13 1821

## 2013-04-28 NOTE — H&P (Signed)
Hospitalist Admission History and Physical  Patient name: Sherri Weber Medical record number: [id number] Date of birth: Dec 29, 1932 Age: 79 y.o. Gender: female  Primary Care Provider: Sanda Linger, MD  Chief Complaint: fall, femoral neck fracture  History of Present Illness:This is a 79 y.o. year old otherwise independent female who presents today with L femoral neck fracture s/p fall. Pt states that she has been in otherwise normal state of health. Pt states she was getting some linens out of the closet when she fell backwards, landing on her L buttock. Pt states that she did not notice pain immediately, but felt significant amount of pain upon ambulation. Pt was brought to ER by a friend. Pt was noted to have a L femoral neck fracture with angulation. Dr. Charlann Boxer from [facility] was called, plan for pt to be operated on in the next 1-2 days. Otherwise medical admission. Pt denies any CP, SOB, dizziness, weakness, headache prior to event. Has ostomy that has been stable. Baseline OAB with in and out caths at home. Pt denies any fevers, chills, back pain or other systemic sxs prior to fall. Pt otherwise independent living prior to fall.   Patient Active Problem List   Diagnosis Date Noted  . Colostomy care 03/27/2013  . Other screening mammogram 01/09/2013  . 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 UTI (lower urinary tract infection) 11/26/2012  . B12 deficiency anemia 10/15/2012  . Osteopenia 02/28/2012  . OAB (overactive bladder) 07/18/2011  . DEPRESSIVE DISORDER 03/14/2011  . Essential hypertension, benign 11/01/2010  . GERD 11/01/2010  . OBESITY 05/27/2010  . HYPERLIPIDEMIA 07/01/2009  . OSTEOARTHRITIS 06/03/2009  . LOW BACK PAIN 06/03/2009  . IBS 04/22/2008   Past Medical History: Past Medical History  Diagnosis Date  . Depressive disorder, not elsewhere classified   . Diarrhea   . Other malaise and fatigue   . Abdominal distension   . Hemorrhoids    . Chronic constipation   . Family history of malignant neoplasm of gastrointestinal tract   . Rectal prolapse   . Ulcer of anus and rectum   . Irritable bowel syndrome   . Low back pain   . Osteoarthritis     Past Surgical History: Past Surgical History  Procedure Laterality Date  . Abdominal hysterectomy    . Cholecystectomy    . Appendectomy    . Hemicolectomy    . Cecectomy    . Colon surgery    . Colostomy      Social History: History   Social History  . Marital Status: Divorced    Spouse Name: N/A    Number of Children: N/A  . Years of Education: N/A   Social History Main Topics  . Smoking status: Never Smoker   . Smokeless tobacco: Never Used  . Alcohol Use: Yes     Comment: Social.  . Drug Use: No  . Sexually Active: Not Currently   Other Topics Concern  . None   Social History Narrative  . None    Family History: Family History  Problem Relation Age of Onset  . Diabetes Sister   . Stroke Sister   . Mental retardation Maternal Uncle   . Emphysema Mother   . Stroke Father   . Cancer Sister     Colon    Allergies: No Known Allergies  Current Facility-Administered Medications  Medication Dose Route Frequency Provider Last Rate Last Dose  . 0.9 %  sodium chloride infusion   Intravenous  Continuous Doree Albee, MD      . ALPRAZolam Prudy Feeler) tablet 2 mg  2 mg Oral TID PRN Doree Albee, MD      . heparin injection 5,000 Units  5,000 Units Subcutaneous Q8H Doree Albee, MD      . oxybutynin (DITROPAN-XL) 24 hr tablet 10 mg  10 mg Oral Daily Doree Albee, MD      . pantoprazole (PROTONIX) EC tablet 40 mg  40 mg Oral Daily Doree Albee, MD      . promethazine (PHENERGAN) tablet 25 mg  25 mg Oral Q6H PRN Doree Albee, MD      . sertraline (ZOLOFT) tablet 50 mg  50 mg Oral Daily Doree Albee, MD      . zolpidem (AMBIEN) tablet 10 mg  10 mg Oral QHS PRN Doree Albee, MD       Current Outpatient Prescriptions  Medication Sig Dispense Refill   . alprazolam (XANAX) 2 MG tablet Take 2 mg by mouth 3 (three) times daily as needed for anxiety.      . cyclobenzaprine (FLEXERIL) 10 MG tablet Take 1 tablet (10 mg total) by mouth 2 (two) times daily as needed for muscle spasms.  60 tablet  11  . dexlansoprazole (DEXILANT) 60 MG capsule Take 60 mg by mouth every evening.       Marland Kitchen HYDROcodone-acetaminophen (NORCO/VICODIN) 5-325 MG per tablet Take 1 tablet by mouth every 8 (eight) hours as needed for pain.  90 tablet  3  . oxybutynin (DITROPAN-XL) 10 MG 24 hr tablet Take 10 mg by mouth every evening.      . promethazine (PHENERGAN) 25 MG tablet Take 25 mg by mouth every 6 (six) hours as needed for nausea.      . sertraline (ZOLOFT) 50 MG tablet Take 50 mg by mouth every evening.      . zolpidem (AMBIEN) 10 MG tablet Take 10 mg by mouth at bedtime as needed for sleep.       Review Of Systems: 12 point ROS negative except as noted above in HPI.  Physical Exam: Filed Vitals:   04/28/13 1825  BP: 135/73  Pulse:   Temp:   Resp: 16    General: alert and cooperative HEENT: PERRLA and extra ocular movement intact Heart: S1, S2 normal, no murmur, rub or gallop, regular rate and rhythm Lungs: clear to auscultation, no wheezes or rales and unlabored breathing Abdomen: soft non tender, + bowel sounds, R lower abdomen ostomy CDI  Extremities: 2+ peripheral pulses, + marked pain with L leg flexion, external rotation, general movement, neurovasculalry intact distally  Skin:no rashes Neurology: normal without focal findings and mental status, speech normal, alert and oriented x3  Labs and Imaging: Lab Results  Component Value Date/Time   NA 136 04/28/2013  6:14 PM   K 3.8 04/28/2013  6:14 PM   CL 101 04/28/2013  6:14 PM   CO2 26 04/28/2013  6:14 PM   BUN 15 04/28/2013  6:14 PM   CREATININE 0.84 04/28/2013  6:14 PM   GLUCOSE 93 04/28/2013  6:14 PM   Lab Results  Component Value Date   WBC 14.2* 04/28/2013   HGB 12.9 04/28/2013   HCT 35.9* 04/28/2013   MCV  87.6 04/28/2013   PLT 246 04/28/2013    Dg Chest 1 View  04/28/2013  *RADIOLOGY REPORT*  Clinical Data: Head fracture.  Preoperative evaluation.  CHEST - 1 VIEW  Comparison: Report dated 05/10/2000.  Findings: Normal sized heart.  Minimally elevated left  hemidiaphragm.  Small medial right diaphragmatic eventration.  Mild right lower lobe bronchiectasis.  Small amount of linear density at the left lung base.  Unremarkable bones.  IMPRESSION:  1.  Small amount of linear atelectasis or scarring at the left lung base. 2.  Mild right lower lobe bronchiectasis.   Original Report Authenticated By: Beckie Salts, M.D.    Dg Hip Complete Left  04/28/2013  *RADIOLOGY REPORT*  Clinical Data: Fall, left growing pain, hip pain.  LEFT HIP - COMPLETE 2+ VIEW  Comparison: 06/05/2012  Findings: There is a left femoral neck fracture with varus angulation and impaction.  No subluxation or dislocation.  Early degenerative changes in the hips bilaterally.  SI joints are symmetric and unremarkable.  IMPRESSION: Left femoral neck fracture with impaction and varus angulation.   Original Report Authenticated By: Charlett Nose, M.D.      Assessment and Plan: VIANEY CANIGLIA is a 79 y.o. year old female presenting with L femoral neck fracture s/p mechanical fall.  Femoral neck fracture: Dr. Charlann Boxer with [facility] consulted, plan to operate in next 1-2 days. Formal consultation in am. Pain management in interim. PT/OT s/p surgery.  Leukocytosis: likely reactive 2/2 to fracture. No signs of systemic/localized infection. Continue follow/trend.   Hypertension: Off meds outpt. BP normalized today. Continue to follow  Anxiety/Depression: Continue zoloft and xanax.   Insomnia: Continue ambien.   OAB: in and out caths. Continue ditropan.     FEN/GI: heart healthy diet (preliminarily ok'd by ortho per report). Ostomy stable. Continue PPI.  Prophylaxis: sub q heparin Disposition: pending further evaluation. Code Status:full  code.        Doree Albee MD  Pager: [phone]

## 2013-04-29 ENCOUNTER — Encounter (HOSPITAL_COMMUNITY): Payer: Self-pay | Admitting: Anesthesiology

## 2013-04-29 ENCOUNTER — Inpatient Hospital Stay (HOSPITAL_COMMUNITY): Payer: Medicare PPO

## 2013-04-29 ENCOUNTER — Encounter (HOSPITAL_COMMUNITY)
Admission: EM | Disposition: A | Discharge status: Skilled Nursing Facility | Payer: Self-pay | Source: Home / Self Care | Attending: Internal Medicine

## 2013-04-29 ENCOUNTER — Inpatient Hospital Stay (HOSPITAL_COMMUNITY): Payer: Medicare PPO | Admitting: Anesthesiology

## 2013-04-29 DIAGNOSIS — D518 Other vitamin B12 deficiency anemias: Secondary | ICD-10-CM

## 2013-04-29 DIAGNOSIS — N318 Other neuromuscular dysfunction of bladder: Secondary | ICD-10-CM

## 2013-04-29 DIAGNOSIS — S72002A Fracture of unspecified part of neck of left femur, initial encounter for closed fracture: Secondary | ICD-10-CM

## 2013-04-29 DIAGNOSIS — S72009D Fracture of unspecified part of neck of unspecified femur, subsequent encounter for closed fracture with routine healing: Secondary | ICD-10-CM

## 2013-04-29 DIAGNOSIS — F329 Major depressive disorder, single episode, unspecified: Secondary | ICD-10-CM

## 2013-04-29 HISTORY — PX: HIP ARTHROPLASTY: SHX981

## 2013-04-29 LAB — COMPREHENSIVE METABOLIC PANEL
ALT: 10 U/L (ref 0–35)
AST: 16 U/L (ref 0–37)
Albumin: 3.4 g/dL — ABNORMAL LOW (ref 3.5–5.2)
Alkaline Phosphatase: 72 U/L (ref 39–117)
BUN: 13 mg/dL (ref 6–23)
CO2: 31 mEq/L (ref 19–32)
Calcium: 8.7 mg/dL (ref 8.4–10.5)
Chloride: 104 mEq/L (ref 96–112)
Creatinine, Ser: 0.8 mg/dL (ref 0.50–1.10)
GFR calc Af Amer: 80 mL/min — ABNORMAL LOW (ref >90)
GFR calc non Af Amer: 69 mL/min — ABNORMAL LOW (ref >90)
Glucose, Bld: 121 mg/dL — ABNORMAL HIGH (ref 70–99)
Potassium: 4.6 mEq/L (ref 3.5–5.1)
Sodium: 141 mEq/L (ref 135–145)
Total Bilirubin: 0.4 mg/dL (ref 0.3–1.2)
Total Protein: 6.4 g/dL (ref 6.0–8.3)

## 2013-04-29 LAB — CBC
HCT: 35.3 % — ABNORMAL LOW (ref 36.0–46.0)
Hemoglobin: 12.3 g/dL (ref 12.0–15.0)
MCH: 30.8 pg (ref 26.0–34.0)
MCHC: 34.8 g/dL (ref 30.0–36.0)
MCV: 88.5 fL (ref 78.0–100.0)
Platelets: 214 10*3/uL (ref 150–400)
RBC: 3.99 MIL/uL (ref 3.87–5.11)
RDW: 12.7 % (ref 11.5–15.5)
WBC: 8.5 10*3/uL (ref 4.0–10.5)

## 2013-04-29 LAB — SURGICAL PCR SCREEN
MRSA, PCR: NEGATIVE
Staphylococcus aureus: NEGATIVE

## 2013-04-29 SURGERY — HEMIARTHROPLASTY, HIP, DIRECT ANTERIOR APPROACH, FOR FRACTURE
Anesthesia: Spinal | Site: Hip | Laterality: Left | Wound class: Clean

## 2013-04-29 MED ORDER — PROPOFOL INFUSION 10 MG/ML OPTIME
INTRAVENOUS | Status: DC | PRN
  Administered 2013-04-29: 75 ug/kg/min via INTRAVENOUS

## 2013-04-29 MED ORDER — ONDANSETRON HCL 4 MG/2ML IJ SOLN
INTRAMUSCULAR | Status: DC | PRN
  Administered 2013-04-29: 4 mg via INTRAVENOUS

## 2013-04-29 MED ORDER — SODIUM CHLORIDE 0.9 % IV SOLN
10.0000 mg | INTRAVENOUS | Status: DC | PRN
  Administered 2013-04-29: 50 ug/min via INTRAVENOUS

## 2013-04-29 MED ORDER — METHOCARBAMOL 100 MG/ML IJ SOLN: 500.0000 mg | Freq: Four times a day (QID) | INTRAVENOUS | Status: DC | PRN

## 2013-04-29 MED ORDER — LACTATED RINGERS IV SOLN
INTRAVENOUS | Status: DC | PRN
  Administered 2013-04-29: 16:00:00 via INTRAVENOUS

## 2013-04-29 MED ORDER — METHOCARBAMOL 500 MG PO TABS
500.0000 mg | ORAL_TABLET | Freq: Four times a day (QID) | ORAL | Status: DC | PRN
  Administered 2013-04-30 – 2013-05-01 (×4): 500 mg via ORAL
  Filled 2013-04-29 (×4): qty 1

## 2013-04-29 MED ORDER — FLEET ENEMA 7-19 GM/118ML RE ENEM: 1.0000 | ENEMA | Freq: Once | RECTAL | Status: AC | PRN

## 2013-04-29 MED ORDER — DOCUSATE SODIUM 100 MG PO CAPS
100.0000 mg | ORAL_CAPSULE | Freq: Two times a day (BID) | ORAL | Status: DC
  Administered 2013-04-29 – 2013-05-01 (×4): 100 mg via ORAL

## 2013-04-29 MED ORDER — ONDANSETRON HCL 4 MG/2ML IJ SOLN: 4.0000 mg | Freq: Four times a day (QID) | INTRAMUSCULAR | Status: DC | PRN

## 2013-04-29 MED ORDER — MIDAZOLAM HCL 5 MG/5ML IJ SOLN
INTRAMUSCULAR | Status: DC | PRN
  Administered 2013-04-29: 2 mg via INTRAVENOUS

## 2013-04-29 MED ORDER — PHENYLEPHRINE HCL 10 MG/ML IJ SOLN
INTRAMUSCULAR | Status: DC | PRN
  Administered 2013-04-29 (×4): 80 ug via INTRAVENOUS

## 2013-04-29 MED ORDER — BLISTEX EX OINT
TOPICAL_OINTMENT | CUTANEOUS | Status: AC
  Administered 2013-04-29: 21:00:00
  Filled 2013-04-29: qty 10

## 2013-04-29 MED ORDER — MEPERIDINE HCL 50 MG/ML IJ SOLN: 6.2500 mg | INTRAMUSCULAR | Status: DC | PRN

## 2013-04-29 MED ORDER — CEFAZOLIN SODIUM-DEXTROSE 2-3 GM-% IV SOLR
2.0000 g | INTRAVENOUS | Status: AC
  Administered 2013-04-29: 2 g via INTRAVENOUS

## 2013-04-29 MED ORDER — PHENOL 1.4 % MT LIQD: 1.0000 | OROMUCOSAL | Status: DC | PRN

## 2013-04-29 MED ORDER — MENTHOL 3 MG MT LOZG: 1.0000 | LOZENGE | OROMUCOSAL | Status: DC | PRN

## 2013-04-29 MED ORDER — ONDANSETRON HCL 4 MG PO TABS: 4.0000 mg | ORAL_TABLET | Freq: Four times a day (QID) | ORAL | Status: DC | PRN

## 2013-04-29 MED ORDER — PROPOFOL 10 MG/ML IV BOLUS
INTRAVENOUS | Status: DC | PRN
  Administered 2013-04-29: 50 mg via INTRAVENOUS

## 2013-04-29 MED ORDER — DEXTROSE 5 % IV SOLN
30.0000 ug/min | INTRAVENOUS | Status: DC
  Administered 2013-04-29: 6.667 ug/min via INTRAVENOUS
  Administered 2013-04-29: 13.333 ug/min via INTRAVENOUS
  Filled 2013-04-29: qty 1

## 2013-04-29 MED ORDER — LACTATED RINGERS IV SOLN: INTRAVENOUS | Status: DC

## 2013-04-29 MED ORDER — BUPIVACAINE HCL (PF) 0.5 % IJ SOLN
INTRAMUSCULAR | Status: DC | PRN
  Administered 2013-04-29: 3 mL

## 2013-04-29 MED ORDER — BISACODYL 10 MG RE SUPP: 10.0000 mg | Freq: Every day | RECTAL | Status: DC | PRN

## 2013-04-29 MED ORDER — POLYETHYLENE GLYCOL 3350 17 G PO PACK: 17.0000 g | PACK | Freq: Every day | ORAL | Status: DC | PRN

## 2013-04-29 MED ORDER — CEFAZOLIN SODIUM-DEXTROSE 2-3 GM-% IV SOLR
2.0000 g | Freq: Four times a day (QID) | INTRAVENOUS | Status: AC
  Administered 2013-04-29 – 2013-04-30 (×2): 2 g via INTRAVENOUS
  Filled 2013-04-29 (×2): qty 50

## 2013-04-29 MED ORDER — ACETAMINOPHEN 10 MG/ML IV SOLN
INTRAVENOUS | Status: DC | PRN
  Administered 2013-04-29: 1000 mg via INTRAVENOUS

## 2013-04-29 MED ORDER — FERROUS SULFATE 325 (65 FE) MG PO TABS
325.0000 mg | ORAL_TABLET | Freq: Three times a day (TID) | ORAL | Status: DC
  Administered 2013-04-30 (×3): 325 mg via ORAL
  Filled 2013-04-29 (×7): qty 1

## 2013-04-29 MED ORDER — EPHEDRINE SULFATE 50 MG/ML IJ SOLN
INTRAMUSCULAR | Status: DC | PRN
  Administered 2013-04-29: 7.5 mg via INTRAVENOUS
  Administered 2013-04-29: 10 mg via INTRAVENOUS

## 2013-04-29 MED ORDER — METOCLOPRAMIDE HCL 5 MG/ML IJ SOLN: 5.0000 mg | Freq: Three times a day (TID) | INTRAMUSCULAR | Status: DC | PRN

## 2013-04-29 MED ORDER — METOCLOPRAMIDE HCL 10 MG PO TABS: 5.0000 mg | ORAL_TABLET | Freq: Three times a day (TID) | ORAL | Status: DC | PRN

## 2013-04-29 MED ORDER — HYDROCODONE-ACETAMINOPHEN 5-325 MG PO TABS
1.0000 | ORAL_TABLET | ORAL | Status: DC | PRN
  Administered 2013-04-29 – 2013-05-01 (×7): 2 via ORAL
  Filled 2013-04-29 (×7): qty 2

## 2013-04-29 MED ORDER — 0.9 % SODIUM CHLORIDE (POUR BTL) OPTIME
TOPICAL | Status: DC | PRN
  Administered 2013-04-29: 1000 mL

## 2013-04-29 MED ORDER — ONDANSETRON HCL 4 MG/2ML IJ SOLN
4.0000 mg | Freq: Four times a day (QID) | INTRAMUSCULAR | Status: DC | PRN
  Administered 2013-04-29 (×2): 4 mg via INTRAVENOUS
  Filled 2013-04-29: qty 2

## 2013-04-29 MED ORDER — FENTANYL CITRATE 0.05 MG/ML IJ SOLN: 25.0000 ug | INTRAMUSCULAR | Status: DC | PRN

## 2013-04-29 MED ORDER — PROMETHAZINE HCL 25 MG/ML IJ SOLN: 6.2500 mg | INTRAMUSCULAR | Status: DC | PRN

## 2013-04-29 MED ORDER — FENTANYL CITRATE 0.05 MG/ML IJ SOLN
INTRAMUSCULAR | Status: DC | PRN
  Administered 2013-04-29 (×2): 50 ug via INTRAVENOUS

## 2013-04-29 SURGICAL SUPPLY — 46 items
BAG ZIPLOCK 12X15 (MISCELLANEOUS) ×2 IMPLANT
BLADE SAW SGTL 18X1.27X75 (BLADE) ×2 IMPLANT
CLOTH BEACON ORANGE TIMEOUT ST (SAFETY) ×2 IMPLANT
DERMABOND ADVANCED (GAUZE/BANDAGES/DRESSINGS) ×1
DERMABOND ADVANCED .7 DNX12 (GAUZE/BANDAGES/DRESSINGS) ×1 IMPLANT
DRAPE INCISE IOBAN 85X60 (DRAPES) ×2 IMPLANT
DRAPE ORTHO SPLIT 77X108 STRL (DRAPES) ×2
DRAPE POUCH INSTRU U-SHP 10X18 (DRAPES) ×2 IMPLANT
DRAPE SURG 17X11 SM STRL (DRAPES) ×2 IMPLANT
DRAPE SURG ORHT 6 SPLT 77X108 (DRAPES) ×2 IMPLANT
DRAPE U-SHAPE 47X51 STRL (DRAPES) ×2 IMPLANT
DRSG AQUACEL AG ADV 3.5X10 (GAUZE/BANDAGES/DRESSINGS) ×2 IMPLANT
DRSG TEGADERM 4X4.75 (GAUZE/BANDAGES/DRESSINGS) ×2 IMPLANT
DURAPREP 26ML APPLICATOR (WOUND CARE) ×2 IMPLANT
ELECT BLADE TIP CTD 4 INCH (ELECTRODE) ×2 IMPLANT
ELECT REM PT RETURN 9FT ADLT (ELECTROSURGICAL) ×2
ELECTRODE REM PT RTRN 9FT ADLT (ELECTROSURGICAL) ×1 IMPLANT
EVACUATOR 1/8 PVC DRAIN (DRAIN) ×2 IMPLANT
FACESHIELD LNG OPTICON STERILE (SAFETY) ×8 IMPLANT
GAUZE SPONGE 2X2 8PLY STRL LF (GAUZE/BANDAGES/DRESSINGS) ×1 IMPLANT
GLOVE BIOGEL PI IND STRL 7.5 (GLOVE) ×1 IMPLANT
GLOVE BIOGEL PI IND STRL 8 (GLOVE) ×1 IMPLANT
GLOVE BIOGEL PI INDICATOR 7.5 (GLOVE) ×1
GLOVE BIOGEL PI INDICATOR 8 (GLOVE) ×1
GLOVE ECLIPSE 8.0 STRL XLNG CF (GLOVE) IMPLANT
GLOVE ORTHO TXT STRL SZ7.5 (GLOVE) ×4 IMPLANT
GLOVE SURG ORTHO 8.0 STRL STRW (GLOVE) ×2 IMPLANT
GOWN BRE IMP PREV XXLGXLNG (GOWN DISPOSABLE) ×4 IMPLANT
GOWN STRL NON-REIN LRG LVL3 (GOWN DISPOSABLE) ×2 IMPLANT
HANDPIECE INTERPULSE COAX TIP (DISPOSABLE)
IMMOBILIZER KNEE 20 (SOFTGOODS)
IMMOBILIZER KNEE 20 THIGH 36 (SOFTGOODS) IMPLANT
KIT BASIN OR (CUSTOM PROCEDURE TRAY) ×2 IMPLANT
MANIFOLD NEPTUNE II (INSTRUMENTS) ×2 IMPLANT
PACK TOTAL JOINT (CUSTOM PROCEDURE TRAY) ×2 IMPLANT
POSITIONER SURGICAL ARM (MISCELLANEOUS) ×2 IMPLANT
SET HNDPC FAN SPRY TIP SCT (DISPOSABLE) IMPLANT
SPONGE GAUZE 2X2 STER 10/PKG (GAUZE/BANDAGES/DRESSINGS) ×1
STRIP CLOSURE SKIN 1/2X4 (GAUZE/BANDAGES/DRESSINGS) ×4 IMPLANT
SUT ETHIBOND NAB CT1 #1 30IN (SUTURE) ×2 IMPLANT
SUT MNCRL AB 4-0 PS2 18 (SUTURE) ×2 IMPLANT
SUT VIC AB 1 CT1 36 (SUTURE) ×4 IMPLANT
SUT VIC AB 2-0 CT1 27 (SUTURE) ×2
SUT VIC AB 2-0 CT1 TAPERPNT 27 (SUTURE) ×2 IMPLANT
TOWEL OR 17X26 10 PK STRL BLUE (TOWEL DISPOSABLE) ×4 IMPLANT
TRAY FOLEY CATH 14FRSI W/METER (CATHETERS) ×2 IMPLANT

## 2013-04-29 NOTE — Progress Notes (Signed)
Spoke to [place] in [place], states she has spoken to Dr. Charlann Boxer and his PA, have patient ready by 1600 for OR and need consent to say left hip hemiarthroplasty for left him fracture.

## 2013-04-29 NOTE — Transfer of Care (Signed)
Immediate Anesthesia Transfer of Care Note  Patient: Sherri Weber  Procedure(s) Performed: Procedure(s): ARTHROPLASTY BIPOLAR HIP (Left)  Patient Location: PACU  Anesthesia Type:Spinal  Level of Consciousness: awake, alert , oriented and patient cooperative  Airway & Oxygen Therapy: Patient Spontanous Breathing and Patient connected to nasal cannula oxygen  Post-op Assessment: Report given to PACU RN and Post -op Vital signs reviewed and stable  Post vital signs: Reviewed and stable  Complications: No apparent anesthesia complications

## 2013-04-29 NOTE — Anesthesia Procedure Notes (Signed)
Spinal  Patient location during procedure: OR Staffing Anesthesiologist: Phillips Grout Performed by: anesthesiologist  Preanesthetic Checklist Completed: patient identified, site marked, surgical consent, pre-op evaluation, timeout performed, IV checked, risks and benefits discussed and monitors and equipment checked Spinal Block Patient position: right lateral decubitus Prep: Betadine Patient monitoring: heart rate, continuous pulse ox and blood pressure Approach: right paramedian Location: L2-3 Injection technique: single-shot Needle Needle type: Spinocan  Needle gauge: 22 G Needle length: 9 cm Additional Notes Expiration date of kit checked and confirmed. Patient tolerated procedure well, without complications.

## 2013-04-29 NOTE — Consult Note (Signed)
Reason for Consult: Left femoral neck fracture Referring Physician:  Lyanne Co, MD  Sherri Weber is an 79 y.o. female.  HPI:   The patient reports tripping and falling at her house yesterday. She landed on her left buttock. She presented to the ER with left hip pain. Her pain was moderate to severe when arriving to the ER. Pain is worsened by palpation and range of motion of her left hip. No neck pain. No head injury. X-rays in the ER reveal a left femoral neck fracture. Dr. Charlann Boxer was consulted and saw the patient this morning. Various options are discusses and she understands and wishes to proceed with a left hip hemi arthroplasty. Risks, benefits and expectations were discussed with the patient. Patient understand the risks, benefits and expectations and wishes to proceed with surgery.   Past Medical History  Diagnosis Date  . Depressive disorder, not elsewhere classified   . Diarrhea   . Other malaise and fatigue   . Abdominal distension   . Hemorrhoids   . Chronic constipation   . Family history of malignant neoplasm of gastrointestinal tract   . Rectal prolapse   . Ulcer of anus and rectum   . Irritable bowel syndrome   . Low back pain   . Osteoarthritis     Past Surgical History  Procedure Laterality Date  . Abdominal hysterectomy    . Cholecystectomy    . Appendectomy    . Hemicolectomy    . Cecectomy    . Colon surgery    . Colostomy      Family History  Problem Relation Age of Onset  . Diabetes Sister   . Stroke Sister   . Mental retardation Maternal Uncle   . Emphysema Mother   . Stroke Father   . Cancer Sister     Colon    Social History:  reports that she has never smoked. She has never used smokeless tobacco. She reports that  drinks alcohol. She reports that she does not use illicit drugs.  Allergies: No Known Allergies   Results for orders placed during the hospital encounter of 04/28/13 (from the past 48 hour(s))  CBC     Status: Abnormal    Collection Time    04/28/13  6:14 PM      Result Value Range   WBC 14.2 (*) 4.0 - 10.5 K/uL   RBC 4.10  3.87 - 5.11 MIL/uL   Hemoglobin 12.9  12.0 - 15.0 g/dL   HCT 29.5 (*) 18.8 - 41.6 %   MCV 87.6  78.0 - 100.0 fL   MCH 31.5  26.0 - 34.0 pg   MCHC 35.9  30.0 - 36.0 g/dL   RDW 60.6  30.1 - 60.1 %   Platelets 246  150 - 400 K/uL  BASIC METABOLIC PANEL     Status: Abnormal   Collection Time    04/28/13  6:14 PM      Result Value Range   Sodium 136  135 - 145 mEq/L   Potassium 3.8  3.5 - 5.1 mEq/L   Chloride 101  96 - 112 mEq/L   CO2 26  19 - 32 mEq/L   Glucose, Bld 93  70 - 99 mg/dL   BUN 15  6 - 23 mg/dL   Creatinine, Ser 0.93  0.50 - 1.10 mg/dL   Calcium 8.7  8.4 - 23.5 mg/dL   GFR calc non Af Amer 65 (*) >90 mL/min   GFR calc Af Denyse Dago  76 (*) >90 mL/min   Comment:            The eGFR has been calculated     using the CKD EPI equation.     This calculation has not been     validated in all clinical     situations.     eGFR's persistently     <90 mL/min signify     possible Chronic Kidney Disease.  TYPE AND SCREEN     Status: None   Collection Time    04/28/13  6:14 PM      Result Value Range   ABO/RH(D) A POS     Antibody Screen NEG     Sample Expiration 05/01/2013    PROTIME-INR     Status: None   Collection Time    04/28/13  6:14 PM      Result Value Range   Prothrombin Time 12.6  11.6 - 15.2 seconds   INR 0.95  0.00 - 1.49  ABO/RH     Status: None   Collection Time    04/28/13  6:14 PM      Result Value Range   ABO/RH(D) A POS    COMPREHENSIVE METABOLIC PANEL     Status: Abnormal   Collection Time    04/29/13  5:25 AM      Result Value Range   Sodium 141  135 - 145 mEq/L   Potassium 4.6  3.5 - 5.1 mEq/L   Comment: NO VISIBLE HEMOLYSIS     DELTA CHECK NOTED   Chloride 104  96 - 112 mEq/L   CO2 31  19 - 32 mEq/L   Glucose, Bld 121 (*) 70 - 99 mg/dL   BUN 13  6 - 23 mg/dL   Creatinine, Ser 1.61  0.50 - 1.10 mg/dL   Calcium 8.7  8.4 - 09.6 mg/dL    Total Protein 6.4  6.0 - 8.3 g/dL   Albumin 3.4 (*) 3.5 - 5.2 g/dL   AST 16  0 - 37 U/L   ALT 10  0 - 35 U/L   Alkaline Phosphatase 72  39 - 117 U/L   Total Bilirubin 0.4  0.3 - 1.2 mg/dL   GFR calc non Af Amer 69 (*) >90 mL/min   GFR calc Af Amer 80 (*) >90 mL/min   Comment:            The eGFR has been calculated     using the CKD EPI equation.     This calculation has not been     validated in all clinical     situations.     eGFR's persistently     <90 mL/min signify     possible Chronic Kidney Disease.  CBC     Status: Abnormal   Collection Time    04/29/13  5:25 AM      Result Value Range   WBC 8.5  4.0 - 10.5 K/uL   RBC 3.99  3.87 - 5.11 MIL/uL   Hemoglobin 12.3  12.0 - 15.0 g/dL   HCT 04.5 (*) 40.9 - 81.1 %   MCV 88.5  78.0 - 100.0 fL   MCH 30.8  26.0 - 34.0 pg   MCHC 34.8  30.0 - 36.0 g/dL   RDW 91.4  78.2 - 95.6 %   Platelets 214  150 - 400 K/uL    Dg Chest 1 View  04/28/2013  *RADIOLOGY REPORT*  Clinical Data: Head fracture.  Preoperative evaluation.  CHEST -  1 VIEW  Comparison: Report dated 05/10/2000.  Findings: Normal sized heart.  Minimally elevated left hemidiaphragm.  Small medial right diaphragmatic eventration.  Mild right lower lobe bronchiectasis.  Small amount of linear density at the left lung base.  Unremarkable bones.  IMPRESSION:  1.  Small amount of linear atelectasis or scarring at the left lung base. 2.  Mild right lower lobe bronchiectasis.   Original Report Authenticated By: Beckie Salts, M.D.    Dg Hip Complete Left  04/28/2013  *RADIOLOGY REPORT*  Clinical Data: Fall, left growing pain, hip pain.  LEFT HIP - COMPLETE 2+ VIEW  Comparison: 06/05/2012  Findings: There is a left femoral neck fracture with varus angulation and impaction.  No subluxation or dislocation.  Early degenerative changes in the hips bilaterally.  SI joints are symmetric and unremarkable.  IMPRESSION: Left femoral neck fracture with impaction and varus angulation.   Original Report  Authenticated By: Charlett Nose, M.D.     Review of Systems  Constitutional: Positive for malaise/fatigue.  HENT: Negative.   Eyes: Negative.   Respiratory: Negative.   Cardiovascular: Negative.   Gastrointestinal: Positive for constipation.  Genitourinary: Negative.   Musculoskeletal: Positive for back pain and joint pain.  Skin: Negative.   Neurological: Negative.   Endo/Heme/Allergies: Negative.   Psychiatric/Behavioral: Positive for depression.   Blood pressure 128/61, pulse 76, temperature 99.9 F (37.7 C), temperature source Oral, resp. rate 18, height 5\' 5"  (1.651 m), weight 57.788 kg (127 lb 6.4 oz), SpO2 99.00%. Physical Exam  Constitutional: She is oriented to person, place, and time. She appears well-developed and well-nourished.  HENT:  Head: Normocephalic and atraumatic.  Mouth/Throat: Oropharynx is clear and moist.  Eyes: Pupils are equal, round, and reactive to light.  Neck: Neck supple. No JVD present. No tracheal deviation present. No thyromegaly present.  Cardiovascular: Normal rate, regular rhythm, normal heart sounds and intact distal pulses.   Respiratory: Effort normal and breath sounds normal. No respiratory distress. She has no wheezes.  GI: Soft. There is no tenderness. There is no guarding.  Musculoskeletal:       Left hip: She exhibits decreased range of motion, decreased strength, tenderness, bony tenderness and swelling. She exhibits no laceration.  Lymphadenopathy:    She has no cervical adenopathy.  Neurological: She is alert and oriented to person, place, and time.  Skin: Skin is warm and dry.  Psychiatric: She has a normal mood and affect.    Assessment/Plan: Left femoral neck fracture  NPO today Surgery is scheduled for today Plan is for a left hip hemi arthroplasty, consent will be obtained.   Gerrit Halls 04/29/2013, 3:04 PM

## 2013-04-29 NOTE — Anesthesia Preprocedure Evaluation (Signed)
Anesthesia Evaluation  Patient identified by MRN, date of birth, ID band Patient awake    Reviewed: Allergy & Precautions, H&P , NPO status , Patient's Chart, lab work & pertinent test results  Airway Mallampati: II TM Distance: >3 FB Neck ROM: Full    Dental no notable dental hx. (+) Edentulous Lower, Edentulous Upper, Upper Dentures and Lower Dentures   Pulmonary neg pulmonary ROS,  breath sounds clear to auscultation  Pulmonary exam normal       Cardiovascular hypertension, negative cardio ROS  Rhythm:Regular Rate:Normal     Neuro/Psych negative neurological ROS  negative psych ROS   GI/Hepatic negative GI ROS, Neg liver ROS,   Endo/Other  negative endocrine ROS  Renal/GU negative Renal ROS  negative genitourinary   Musculoskeletal negative musculoskeletal ROS (+)   Abdominal   Peds negative pediatric ROS (+)  Hematology negative hematology ROS (+)   Anesthesia Other Findings   Reproductive/Obstetrics negative OB ROS                           Anesthesia Physical Anesthesia Plan  ASA: II  Anesthesia Plan: Spinal   Post-op Pain Management:    Induction: Intravenous  Airway Management Planned: Simple Face Mask  Additional Equipment:   Intra-op Plan:   Post-operative Plan:   Informed Consent: I have reviewed the patients History and Physical, chart, labs and discussed the procedure including the risks, benefits and alternatives for the proposed anesthesia with the patient or authorized representative who has indicated his/her understanding and acceptance.   Dental advisory given  Plan Discussed with: CRNA  Anesthesia Plan Comments:         Anesthesia Quick Evaluation

## 2013-04-29 NOTE — Op Note (Signed)
NAME:  Sherri Weber                ACCOUNT NO.:  [id number]   MEDICAL RECORD NO.: [id number]   LOCATION:  1435                         FACILITY:  [facility]   DATE OF BIRTH:  November 08, 1933  PHYSICIAN:  Madlyn Frankel. Charlann Boxer, M.D.     DATE OF PROCEDURE:  04/29/2013                               OPERATIVE REPORT     PREOPERATIVE DIAGNOSIS:  Left displaced femoral neck fracture.   POSTOPERATIVE DIAGNOSIS:  Left displaced femoral neck fracture.   PROCEDURE:  Left hip hemiarthroplasty utilizing DePuy component, size 3 Hi Tri-Lock stem with a 45 unipolar ball with a +0 adapter.   SURGEON:  Madlyn Frankel. Charlann Boxer, MD   ASSISTANT:  Lanney Gins, PA-C.   ANESTHESIA:  General.   SPECIMENS:  None.   DRAINS:  One medium Hemovac.   BLOOD LOSS:  About <150 cc.   COMPLICATIONS:  None.   INDICATION OF PROCEDURE:  Sherri Weber is a 79 year old female who lives independently.  She unfortunately had a fall at her house.  She was admitted to the hospital after radiographs revealed a femoral neck fracture.  She was seen and evaluated and was scheduled for surgery for fixation.  The necessity of surgical repair was discussed with her reviewing benefits and necessities.  Consent was obtained after reviewing risks of infection, DVT, component failure, and need for revision surgery.   PROCEDURE IN DETAIL:  The patient was brought to the operative theater. Once adequate anesthesia, preoperative antibiotics, 2 g of Ancef administered, the patient was positioned into the right lateral decubitus position with the left side up.  The left lower extremity was then prepped and draped in sterile fashion.  A time-out was performed identifying the patient, planned procedure, and extremity.   A lateral incision was made off the proximal trochanter. Sharp dissection was carried down to the iliotibial band and gluteal fascia. The gluteal fascia was then incised for posterior approach.  The short external rotators were  taken down separate from the posterior capsule. An L capsulotomy was made preserving the posterior leaflet for later anatomic repair. Fracture site was identified and after removing comminuted segments of the posterior femoral neck, the femoral head was removed without difficulty and measured on the back table  using the sizing rings and determined to be 45 mm in diameter.   The proximal femur was then exposed.  Retractors placed.  I then drilled, opened the proximal femur.  Then I hand reamed once and  Irrigated the canal to try to prevent fat emboli.  I began broaching the femur with a starting broach up to a size 3 broach with good medial and lateral metaphyseal fit without evidence of any torsion or movement.  A trial reduction was carried out with a high offset neck and a +0 adapter with a 45 ball.  The hip reduced nicely.  The leg lengths appeared to be equal compared to the down leg.   The hip went through a range of motion without evidence of any subluxation or impingement.   Given these findings, the trial components removed.  The final 3 Hi  Tri-Lock stem was opened.  After irrigating the canal, the final stem  was impacted and sat at the level where the broach was. Based on this and the trial reduction, a +0 adapter was opened and impacted in the 45mm unipolar ball onto a clean and dry trunnion.  The hip had been irrigated throughout the case and again at this point.  I re- Approximated the posterior capsule to the superior leaflet using a  #1 Vicryl,  and placed a medium Hemovac drain deep.  The remainder of the wound was closed with #1 Vicryl in the iliotibial band and gluteal fascia, a  2-0 Vicryl in the sub-Q tissue and a running 4-0 Monocryl in the skin.  The hip was cleaned, dried, and dressed sterilely using Dermabond and Aquacel dressing.  Drain site was dressed separately.  She was then brought to recovery room, extubated in stable condition, tolerating the procedure  well.  Lanney Gins, PA-C was present and utilized as Geophysicist/field seismologist for the entire case from  Preoperative positioning to management of the contralateral extremity and retractors to  General facilitation of the procedure.  He was also involved with primary wound closure.         Madlyn Frankel Charlann Boxer, M.D.

## 2013-04-29 NOTE — Interval H&P Note (Signed)
History and Physical Interval Note:  04/29/2013 4:34 PM  Sherri Weber  has presented today for surgery, with the diagnosis of fractured left hip  The various methods of treatment have been discussed with the patient and family. After consideration of risks, benefits and other options for treatment, the patient has consented to  Procedure(s): ARTHROPLASTY BIPOLAR HIP (Left) as a surgical intervention .  The patient's history has been reviewed, patient examined, no change in status, stable for surgery.  I have reviewed the patient's chart and labs.  Questions were answered to the patient's satisfaction.     Shelda Pal

## 2013-04-29 NOTE — Progress Notes (Signed)
Clinical Social Work Department BRIEF PSYCHOSOCIAL ASSESSMENT 04/29/2013  Patient:  Sherri Weber, Sherri Weber     Account Number:  [id number]     Admit date:  04/28/2013  Clinical Social Worker:  Candie Chroman  Date/Time:  04/29/2013 02:23 PM  Referred by:  RN  Date Referred:  04/29/2013 Referred for  SNF Placement   Other Referral:   Interview type:  Patient Other interview type:    PSYCHOSOCIAL DATA Living Status:  ALONE Admitted from facility:   Level of care:   Primary support name:  Mardella Layman Primary support relationship to patient:  NEIGHBOR Degree of support available:   unclear    CURRENT CONCERNS Current Concerns  Substance Abuse   Other Concerns:    SOCIAL WORK ASSESSMENT / PLAN Pt is a 79 yr old female living at home prior to hospitalization. CSW met with pt to assist with d/c planning. Pt has surgery pending and will need ST Rehab following hospital d/c. CSW has initated SNF search and will provide bed offer s as received. Pt has [employer] which requires pre authorization. CSW will assist with this process.   Assessment/plan status:  Psychosocial Support/Ongoing Assessment of Needs Other assessment/ plan:   Information/referral to community resources:   SNF list provided and insurance reviewed.    PATIENT'S/FAMILY'S RESPONSE TO PLAN OF CARE: Pt is looking forward to having surgery completed. She is in agreement with plan for ST Rehab.    Cori Razor LCSW [phone]

## 2013-04-29 NOTE — Progress Notes (Signed)
Clinical Social Work Department CLINICAL SOCIAL WORK PLACEMENT NOTE 04/29/2013  Patient:  Sherri Weber, Sherri Weber  Account Number:  [id number] Admit date:  04/28/2013  Clinical Social Worker:  Cori Razor, LCSW  Date/time:  04/29/2013 03:00 PM  Clinical Social Work is seeking post-discharge placement for this patient at the following level of care:   SKILLED NURSING   (*CSW will update this form in Epic as items are completed)   04/29/2013  Patient/family provided with [facility] Department of Clinical Social Work's list of facilities offering this level of care within the geographic area requested by the patient (or if unable, by the patient's family).  04/29/2013  Patient/family informed of their freedom to choose among providers that offer the needed level of care, that participate in Medicare, Medicaid or managed care program needed by the patient, have an available bed and are willing to accept the patient.    Patient/family informed of [facility]' ownership interest in [facility], as well as of the fact that they are under no obligation to receive care at this facility.  PASARR submitted to EDS on 04/29/2013 PASARR number received from EDS on   FL2 transmitted to all facilities in geographic area requested by pt/family on  04/29/2013 FL2 transmitted to all facilities within larger geographic area on   Patient informed that his/her managed care company has contracts with or will negotiate with  certain facilities, including the following:     Patient/family informed of bed offers received:   Patient chooses bed at  Physician recommends and patient chooses bed at    Patient to be transferred to  on   Patient to be transferred to facility by   The following physician request were entered in Epic:   Additional Comments:  Cori Razor LCSW [phone]

## 2013-04-29 NOTE — Care Management Note (Addendum)
    Page 1 of 1   05/01/2013     7:28:27 PM   CARE MANAGEMENT NOTE 05/01/2013  Patient:  Sherri Weber, Sherri Weber   Account Number:  [id number]  Date Initiated:  04/29/2013  Documentation initiated by:  Lorenda Ishihara  Subjective/Objective Assessment:   79 yo female admitted s/p fall with hip fracture. PTA lived at home alone.     Action/Plan:   Home vs SNF, await post op PT recommendations.   Anticipated DC Date:  05/02/2013   Anticipated DC Plan:  SKILLED NURSING FACILITY  In-house referral  Clinical Social Worker      DC Planning Services  CM consult      Choice offered to / List presented to:             Status of service:  Completed, signed off Medicare Important Message given?  NA - LOS <3 / Initial given by admissions (If response is "NO", the following Medicare IM given date fields will be blank) Date Medicare IM given:   Date Additional Medicare IM given:    Discharge Disposition:  SKILLED NURSING FACILITY  Per UR Regulation:  Reviewed for med. necessity/level of care/duration of stay  If discussed at Long Length of Stay Meetings, dates discussed:    Comments:

## 2013-04-29 NOTE — Progress Notes (Signed)
[facility] PROGRESS NOTE  KOBI ALLER ZOX:[id number] DOB: 27-Oct-1933 DOA: 04/28/2013 PCP: Sanda Linger, MD  Assessment/Plan: Left femoral neck fracture -appreciate Dr. Charlann Boxer -pain control -PT after surgery and removal of foley -Medically stable for surgery Leukocytosis -Resolved -Likely reactive secondary to fracture Anxiety/depression -Continue Zoloft and when necessary Xanax Rectal prolapse -Status post colostomy Overactive bladder -Continue Ditropan XL B12 deficiency -Check B12 the morning   Family Communication:   Pt at beside Disposition Plan:   SNF when medically stable     Procedures/Studies: Dg Chest 1 View  04/28/2013  *RADIOLOGY REPORT*  Clinical Data: Head fracture.  Preoperative evaluation.  CHEST - 1 VIEW  Comparison: Report dated 05/10/2000.  Findings: Normal sized heart.  Minimally elevated left hemidiaphragm.  Small medial right diaphragmatic eventration.  Mild right lower lobe bronchiectasis.  Small amount of linear density at the left lung base.  Unremarkable bones.  IMPRESSION:  1.  Small amount of linear atelectasis or scarring at the left lung base. 2.  Mild right lower lobe bronchiectasis.   Original Report Authenticated By: Beckie Salts, M.D.    Dg Hip Complete Left  04/28/2013  *RADIOLOGY REPORT*  Clinical Data: Fall, left growing pain, hip pain.  LEFT HIP - COMPLETE 2+ VIEW  Comparison: 06/05/2012  Findings: There is a left femoral neck fracture with varus angulation and impaction.  No subluxation or dislocation.  Early degenerative changes in the hips bilaterally.  SI joints are symmetric and unremarkable.  IMPRESSION: Left femoral neck fracture with impaction and varus angulation.   Original Report Authenticated By: Charlett Nose, M.D.          Subjective: Patient complains of left hip pain. Denies any fevers, chills, chest pain, shortness breath, nausea, vomiting, diarrhea, abdominal pain. No dizziness. No syncope.  Objective: Filed  Vitals:   04/28/13 2015 04/28/13 2020 04/28/13 2040 04/29/13 0507  BP: 112/61  125/61 121/78  Pulse:   81 79  Temp:  98.6 F (37 C) 98.8 F (37.1 C) 98.2 F (36.8 C)  TempSrc:  Oral Oral Oral  Resp: 14  16   Height:   5\' 5"  (1.651 m)   Weight:   57.788 kg (127 lb 6.4 oz)   SpO2:   100% 96%    Intake/Output Summary (Last 24 hours) at 04/29/13 1244 Last data filed at 04/29/13 1000  Gross per 24 hour  Intake 673.33 ml  Output    850 ml  Net -176.67 ml   Weight change:  Exam:   General:  Pt is alert, follows commands appropriately, not in acute distress  HEENT: No icterus, No thrush,  [place]/AT  Cardiovascular: RRR, S1/S2, no rubs, no gallops  Respiratory: CTA bilaterally, no wheezing, no crackles, no rhonchi  Abdomen: Soft/+BS, non tender, non distended, no guarding  Extremities: trace edema, No lymphangitis, No petechiae, No rashes, no synovitis  Data Reviewed: Basic Metabolic Panel:  Recent Labs Lab 04/28/13 1814 04/29/13 0525  NA 136 141  K 3.8 4.6  CL 101 104  CO2 26 31  GLUCOSE 93 121*  BUN 15 13  CREATININE 0.84 0.80  CALCIUM 8.7 8.7   Liver Function Tests:  Recent Labs Lab 04/29/13 0525  AST 16  ALT 10  ALKPHOS 72  BILITOT 0.4  PROT 6.4  ALBUMIN 3.4*   No results found for this basename: LIPASE, AMYLASE,  in the last 168 hours No results found for this basename: AMMONIA,  in the last 168 hours CBC:  Recent Labs Lab 04/28/13 1814  04/29/13 0525  WBC 14.2* 8.5  HGB 12.9 12.3  HCT 35.9* 35.3*  MCV 87.6 88.5  PLT 246 214   Cardiac Enzymes: No results found for this basename: CKTOTAL, CKMB, CKMBINDEX, TROPONINI,  in the last 168 hours BNP: No components found with this basename: POCBNP,  CBG: No results found for this basename: GLUCAP,  in the last 168 hours  No results found for this or any previous visit (from the past 240 hour(s)).   Scheduled Meds: . heparin  5,000 Units Subcutaneous Q8H  . oxybutynin  10 mg Oral Daily  .  pantoprazole  80 mg Oral Daily  . sertraline  50 mg Oral Daily   Continuous Infusions: . sodium chloride 50 mL/hr at 04/29/13 1047     Ayiden Milliman, DO  [facility] Pager [phone]  If 7PM-7AM, please contact night-coverage [email or web address] Password [email or web address] 04/29/2013, 12:44 PM   LOS: 1 day

## 2013-04-29 NOTE — Progress Notes (Signed)
INITIAL NUTRITION ASSESSMENT  DOCUMENTATION CODES Per approved criteria  -Not Applicable   INTERVENTION: - Recommend Boos Plus TID when diet advanced - Diet advancement per MD - Recommend MD provide appropriate medication for lip blister  - Will continue to monitor   NUTRITION DIAGNOSIS: Inadequate oral intake related to inability to eat as evidenced by NPO.   Goal: Advance diet as tolerated to regular diet.   Monitor:  Weights, labs, diet advancement  Reason for Assessment: Nutrition risk   79 y.o. female  Admitting Dx: Fall, left femoral neck fracture   ASSESSMENT: Met with pt who reports poor appetite at home r/t living alone, tries to eat at least 1 good meal/day and drink 2-3 Boost/day. Pt reports weight is usually 133 pounds (04/25/13) but noted it's down to 127 pounds, likely r/t changes in scales. Pt admitted with a fall and is NPO for left hip arthroplasty. Pt denies any problems with colostomy. Pt c/o blister inside top right part of her mouth that is bothering her.   Height: Ht Readings from Last 1 Encounters:  04/28/13 5\' 5"  (1.651 m)    Weight: Wt Readings from Last 1 Encounters:  04/28/13 127 lb 6.4 oz (57.788 kg)    Ideal Body Weight: 125 lb  % Ideal Body Weight: 102  Wt Readings from Last 10 Encounters:  04/28/13 127 lb 6.4 oz (57.788 kg)  04/28/13 127 lb 6.4 oz (57.788 kg)  04/25/13 133 lb 6.4 oz (60.51 kg)  03/27/13 137 lb 6.4 oz (62.324 kg)  03/06/13 139 lb (63.05 kg)  01/09/13 132 lb (59.875 kg)  12/24/12 128 lb (58.06 kg)  12/07/12 132 lb (59.875 kg)  11/26/12 132 lb 8 oz (60.102 kg)  10/29/12 135 lb 12 oz (61.576 kg)    Usual Body Weight: 133 lb  % Usual Body Weight: 95  BMI:  Body mass index is 21.2 kg/(m^2).  Estimated Nutritional Needs: Kcal: 1425-1700 Protein: 60-70g Fluid: 1.4-1.7L/day  Skin: Intact   Diet Order: NPO  EDUCATION NEEDS: -No education needs identified at this time   Intake/Output Summary (Last 24 hours)  at 04/29/13 1720 Last data filed at 04/29/13 1400  Gross per 24 hour  Intake 873.33 ml  Output    950 ml  Net -76.67 ml    Last BM: PTA  Labs:   Recent Labs Lab 04/28/13 1814 04/29/13 0525  NA 136 141  K 3.8 4.6  CL 101 104  CO2 26 31  BUN 15 13  CREATININE 0.84 0.80  CALCIUM 8.7 8.7  GLUCOSE 93 121*    CBG (last 3)  No results found for this basename: GLUCAP,  in the last 72 hours  Scheduled Meds: .  ceFAZolin (ANCEF) IV  2 g Intravenous On Call to OR  . [MAR HOLD] heparin  5,000 Units Subcutaneous Q8H  . [facility] HOLD] oxybutynin  10 mg Oral Daily  . [facility] HOLD] pantoprazole  80 mg Oral Daily  . [facility] HOLD] sertraline  50 mg Oral Daily    Continuous Infusions: . sodium chloride 50 mL/hr at 04/29/13 1047    Past Medical History  Diagnosis Date  . Depressive disorder, not elsewhere classified   . Diarrhea   . Other malaise and fatigue   . Abdominal distension   . Hemorrhoids   . Chronic constipation   . Family history of malignant neoplasm of gastrointestinal tract   . Rectal prolapse   . Ulcer of anus and rectum   . Irritable bowel syndrome   .  Low back pain   . Osteoarthritis     Past Surgical History  Procedure Laterality Date  . Abdominal hysterectomy    . Cholecystectomy    . Appendectomy    . Hemicolectomy    . Cecectomy    . Colon surgery    . Colostomy       Levon Hedger MS, RD, LDN [phone] Pager [phone] After Hours Pager

## 2013-04-29 NOTE — Progress Notes (Signed)
Called [facility] to give report, told Nursing staff to call when patient room assignment had been determined

## 2013-04-29 NOTE — Anesthesia Postprocedure Evaluation (Signed)
  Anesthesia Post-op Note  Patient: Sherri Weber  Procedure(s) Performed: Procedure(s) (LRB): ARTHROPLASTY BIPOLAR HIP (Left)  Patient Location: PACU  Anesthesia Type: General  Level of Consciousness: awake and alert   Airway and Oxygen Therapy: Patient Spontanous Breathing  Post-op Pain: mild  Post-op Assessment: Post-op Vital signs reviewed, Patient's Cardiovascular Status Stable, Respiratory Function Stable, Patent Airway and No signs of Nausea or vomiting  Last Vitals:  Filed Vitals:   04/29/13 2010  BP: 100/47  Pulse: 89  Temp:   Resp: 15    Post-op Vital Signs: stable   Complications: No apparent anesthesia complications

## 2013-04-29 NOTE — H&P (View-Only) (Signed)
Reason for Consult: Left femoral neck fracture Referring Physician:  Kevin M Campos, MD  Sherri Weber is an 79 y.o. female.  HPI:   The patient reports tripping and falling at her house yesterday. She landed on her left buttock. She presented to the ER with left hip pain. Her pain was moderate to severe when arriving to the ER. Pain is worsened by palpation and range of motion of her left hip. No neck pain. No head injury. X-rays in the ER reveal a left femoral neck fracture. Dr. Olin was consulted and saw the patient this morning. Various options are discusses and she understands and wishes to proceed with a left hip hemi arthroplasty. Risks, benefits and expectations were discussed with the patient. Patient understand the risks, benefits and expectations and wishes to proceed with surgery.   Past Medical History  Diagnosis Date  . Depressive disorder, not elsewhere classified   . Diarrhea   . Other malaise and fatigue   . Abdominal distension   . Hemorrhoids   . Chronic constipation   . Family history of malignant neoplasm of gastrointestinal tract   . Rectal prolapse   . Ulcer of anus and rectum   . Irritable bowel syndrome   . Low back pain   . Osteoarthritis     Past Surgical History  Procedure Laterality Date  . Abdominal hysterectomy    . Cholecystectomy    . Appendectomy    . Hemicolectomy    . Cecectomy    . Colon surgery    . Colostomy      Family History  Problem Relation Age of Onset  . Diabetes Sister   . Stroke Sister   . Mental retardation Maternal Uncle   . Emphysema Mother   . Stroke Father   . Cancer Sister     Colon    Social History:  reports that she has never smoked. She has never used smokeless tobacco. She reports that  drinks alcohol. She reports that she does not use illicit drugs.  Allergies: No Known Allergies   Results for orders placed during the hospital encounter of 04/28/13 (from the past 48 hour(s))  CBC     Status: Abnormal    Collection Time    04/28/13  6:14 PM      Result Value Range   WBC 14.2 (*) 4.0 - 10.5 K/uL   RBC 4.10  3.87 - 5.11 MIL/uL   Hemoglobin 12.9  12.0 - 15.0 g/dL   HCT 35.9 (*) 36.0 - 46.0 %   MCV 87.6  78.0 - 100.0 fL   MCH 31.5  26.0 - 34.0 pg   MCHC 35.9  30.0 - 36.0 g/dL   RDW 12.4  11.5 - 15.5 %   Platelets 246  150 - 400 K/uL  BASIC METABOLIC PANEL     Status: Abnormal   Collection Time    04/28/13  6:14 PM      Result Value Range   Sodium 136  135 - 145 mEq/L   Potassium 3.8  3.5 - 5.1 mEq/L   Chloride 101  96 - 112 mEq/L   CO2 26  19 - 32 mEq/L   Glucose, Bld 93  70 - 99 mg/dL   BUN 15  6 - 23 mg/dL   Creatinine, Ser 0.84  0.50 - 1.10 mg/dL   Calcium 8.7  8.4 - 10.5 mg/dL   GFR calc non Af Amer 65 (*) >90 mL/min   GFR calc Af Amer   76 (*) >90 mL/min   Comment:            The eGFR has been calculated     using the CKD EPI equation.     This calculation has not been     validated in all clinical     situations.     eGFR's persistently     <90 mL/min signify     possible Chronic Kidney Disease.  TYPE AND SCREEN     Status: None   Collection Time    04/28/13  6:14 PM      Result Value Range   ABO/RH(D) A POS     Antibody Screen NEG     Sample Expiration 05/01/2013    PROTIME-INR     Status: None   Collection Time    04/28/13  6:14 PM      Result Value Range   Prothrombin Time 12.6  11.6 - 15.2 seconds   INR 0.95  0.00 - 1.49  ABO/RH     Status: None   Collection Time    04/28/13  6:14 PM      Result Value Range   ABO/RH(D) A POS    COMPREHENSIVE METABOLIC PANEL     Status: Abnormal   Collection Time    04/29/13  5:25 AM      Result Value Range   Sodium 141  135 - 145 mEq/L   Potassium 4.6  3.5 - 5.1 mEq/L   Comment: NO VISIBLE HEMOLYSIS     DELTA CHECK NOTED   Chloride 104  96 - 112 mEq/L   CO2 31  19 - 32 mEq/L   Glucose, Bld 121 (*) 70 - 99 mg/dL   BUN 13  6 - 23 mg/dL   Creatinine, Ser 0.80  0.50 - 1.10 mg/dL   Calcium 8.7  8.4 - 10.5 mg/dL    Total Protein 6.4  6.0 - 8.3 g/dL   Albumin 3.4 (*) 3.5 - 5.2 g/dL   AST 16  0 - 37 U/L   ALT 10  0 - 35 U/L   Alkaline Phosphatase 72  39 - 117 U/L   Total Bilirubin 0.4  0.3 - 1.2 mg/dL   GFR calc non Af Amer 69 (*) >90 mL/min   GFR calc Af Amer 80 (*) >90 mL/min   Comment:            The eGFR has been calculated     using the CKD EPI equation.     This calculation has not been     validated in all clinical     situations.     eGFR's persistently     <90 mL/min signify     possible Chronic Kidney Disease.  CBC     Status: Abnormal   Collection Time    04/29/13  5:25 AM      Result Value Range   WBC 8.5  4.0 - 10.5 K/uL   RBC 3.99  3.87 - 5.11 MIL/uL   Hemoglobin 12.3  12.0 - 15.0 g/dL   HCT 35.3 (*) 36.0 - 46.0 %   MCV 88.5  78.0 - 100.0 fL   MCH 30.8  26.0 - 34.0 pg   MCHC 34.8  30.0 - 36.0 g/dL   RDW 12.7  11.5 - 15.5 %   Platelets 214  150 - 400 K/uL    Dg Chest 1 View  04/28/2013  *RADIOLOGY REPORT*  Clinical Data: Head fracture.  Preoperative evaluation.  CHEST -   1 VIEW  Comparison: Report dated 05/10/2000.  Findings: Normal sized heart.  Minimally elevated left hemidiaphragm.  Small medial right diaphragmatic eventration.  Mild right lower lobe bronchiectasis.  Small amount of linear density at the left lung base.  Unremarkable bones.  IMPRESSION:  1.  Small amount of linear atelectasis or scarring at the left lung base. 2.  Mild right lower lobe bronchiectasis.   Original Report Authenticated By: Steven Reid, M.D.    Dg Hip Complete Left  04/28/2013  *RADIOLOGY REPORT*  Clinical Data: Fall, left growing pain, hip pain.  LEFT HIP - COMPLETE 2+ VIEW  Comparison: 06/05/2012  Findings: There is a left femoral neck fracture with varus angulation and impaction.  No subluxation or dislocation.  Early degenerative changes in the hips bilaterally.  SI joints are symmetric and unremarkable.  IMPRESSION: Left femoral neck fracture with impaction and varus angulation.   Original Report  Authenticated By: Kevin Dover, M.D.     Review of Systems  Constitutional: Positive for malaise/fatigue.  HENT: Negative.   Eyes: Negative.   Respiratory: Negative.   Cardiovascular: Negative.   Gastrointestinal: Positive for constipation.  Genitourinary: Negative.   Musculoskeletal: Positive for back pain and joint pain.  Skin: Negative.   Neurological: Negative.   Endo/Heme/Allergies: Negative.   Psychiatric/Behavioral: Positive for depression.   Blood pressure 128/61, pulse 76, temperature 99.9 F (37.7 C), temperature source Oral, resp. rate 18, height 5' 5" (1.651 m), weight 57.788 kg (127 lb 6.4 oz), SpO2 99.00%. Physical Exam  Constitutional: She is oriented to person, place, and time. She appears well-developed and well-nourished.  HENT:  Head: Normocephalic and atraumatic.  Mouth/Throat: Oropharynx is clear and moist.  Eyes: Pupils are equal, round, and reactive to light.  Neck: Neck supple. No JVD present. No tracheal deviation present. No thyromegaly present.  Cardiovascular: Normal rate, regular rhythm, normal heart sounds and intact distal pulses.   Respiratory: Effort normal and breath sounds normal. No respiratory distress. She has no wheezes.  GI: Soft. There is no tenderness. There is no guarding.  Musculoskeletal:       Left hip: She exhibits decreased range of motion, decreased strength, tenderness, bony tenderness and swelling. She exhibits no laceration.  Lymphadenopathy:    She has no cervical adenopathy.  Neurological: She is alert and oriented to person, place, and time.  Skin: Skin is warm and dry.  Psychiatric: She has a normal mood and affect.    Assessment/Plan: Left femoral neck fracture  NPO today Surgery is scheduled for today Plan is for a left hip hemi arthroplasty, consent will be obtained.   Albertina Leise Scott 04/29/2013, 3:04 PM      

## 2013-04-30 ENCOUNTER — Encounter (HOSPITAL_COMMUNITY): Payer: Self-pay | Admitting: Orthopedic Surgery

## 2013-04-30 DIAGNOSIS — IMO0002 Reserved for concepts with insufficient information to code with codable children: Secondary | ICD-10-CM

## 2013-04-30 DIAGNOSIS — S72009A Fracture of unspecified part of neck of unspecified femur, initial encounter for closed fracture: Principal | ICD-10-CM

## 2013-04-30 LAB — BASIC METABOLIC PANEL
BUN: 8 mg/dL (ref 6–23)
CO2: 23 mEq/L (ref 19–32)
Calcium: 7.9 mg/dL — ABNORMAL LOW (ref 8.4–10.5)
Chloride: 102 mEq/L (ref 96–112)
Creatinine, Ser: 0.62 mg/dL (ref 0.50–1.10)
GFR calc Af Amer: >90 mL/min (ref >90)
GFR calc non Af Amer: 85 mL/min — ABNORMAL LOW (ref >90)
Glucose, Bld: 107 mg/dL — ABNORMAL HIGH (ref 70–99)
Potassium: 4.4 mEq/L (ref 3.5–5.1)
Sodium: 135 mEq/L (ref 135–145)

## 2013-04-30 LAB — CBC
HCT: 33.2 % — ABNORMAL LOW (ref 36.0–46.0)
Hemoglobin: 11.4 g/dL — ABNORMAL LOW (ref 12.0–15.0)
MCH: 30.7 pg (ref 26.0–34.0)
MCHC: 34.3 g/dL (ref 30.0–36.0)
MCV: 89.5 fL (ref 78.0–100.0)
Platelets: 163 10*3/uL (ref 150–400)
RBC: 3.71 MIL/uL — ABNORMAL LOW (ref 3.87–5.11)
RDW: 12.8 % (ref 11.5–15.5)
WBC: 11.1 10*3/uL — ABNORMAL HIGH (ref 4.0–10.5)

## 2013-04-30 LAB — VITAMIN B12: Vitamin B-12: 439 pg/mL (ref 211–911)

## 2013-04-30 NOTE — Progress Notes (Signed)
Physical Therapy Treatment Patient Details Name: Sherri Weber MRN: [id number] DOB: Sep 18, 1933 Today's Date: 04/30/2013 Time: 1440-1453 PT Time Calculation (min): 13 min  PT Assessment / Plan / Recommendation Comments on Treatment Session       Follow Up Recommendations  SNF     Does the patient have the potential to tolerate intense rehabilitation     Barriers to Discharge        Equipment Recommendations  Rolling walker with 5" wheels    Recommendations for Other Services OT consult  Frequency Min 3X/week   Plan Discharge plan remains appropriate    Precautions / Restrictions Precautions Precautions: Posterior Hip;Fall Precaution Comments: Reviewed and demonstrated 3/3 hip precautions.  Restrictions Weight Bearing Restrictions: No LLE Weight Bearing: Weight bearing as tolerated   Pertinent Vitals/Pain 8/10 L hip    Mobility  Bed Mobility Bed Mobility: Sit to Supine Sit to Supine: Patient Percentage: + 2 Tot assist: 60% Details for Bed Mobility Assistance: Assist for L LE onto bed. Vcs safety, technique, hand placement.  Transfers Transfers: Sit to Stand;Stand to Sit;Stand Pivot Transfers Sit to Stand: 1: +2 Total assist;From chair/3-in-1;With armrests Sit to Stand: Patient Percentage: 60% Stand to Sit: 1: +2 Total assist;To bed;With upper extremity assist Stand to Sit: Patient Percentage: 60% Details for Transfer Assistance: VCs safety, technique, hand placement. Assist to rise, stabilize, control descent.    Exercises     PT Diagnosis: Difficulty walking;Abnormality of gait;Acute pain  PT Problem List: Decreased strength;Decreased range of motion;Decreased activity tolerance;Decreased balance;Pain;Decreased knowledge of use of DME;Decreased knowledge of precautions PT Treatment Interventions: DME instruction;Gait training;Functional mobility training;Therapeutic activities;Therapeutic exercise;Balance training;Patient/family education   PT Goals Acute  Rehab PT Goals PT Goal Formulation: With patient Time For Goal Achievement: 05/14/13 Potential to Achieve Goals: Good Pt will go Supine/Side to Sit: with min assist PT Goal: Supine/Side to Sit - Progress: Goal set today Pt will go Sit to Supine/Side: with min assist PT Goal: Sit to Supine/Side - Progress: Progressing toward goal Pt will go Sit to Stand: with min assist PT Goal: Sit to Stand - Progress: Progressing toward goal Pt will Transfer Bed to Chair/Chair to Bed: with min assist PT Transfer Goal: Bed to Chair/Chair to Bed - Progress: Progressing toward goal Pt will Ambulate: 51 - 150 feet;with min assist;with rolling walker PT Goal: Ambulate - Progress: Goal set today Pt will Perform Home Exercise Program: with min assist PT Goal: Perform Home Exercise Program - Progress: Goal set today  Visit Information  Last PT Received On: 04/30/13 Assistance Needed: +2    Subjective Data  Subjective: that wasn't too bad Patient Stated Goal: rehab   Cognition  Cognition Arousal/Alertness: Awake/alert Behavior During Therapy: WFL for tasks assessed/performed Overall Cognitive Status: Within Functional Limits for tasks assessed    Balance     End of Session PT - End of Session Equipment Utilized During Treatment: Gait belt Activity Tolerance: Patient tolerated treatment well Patient left: in bed;with call bell/phone within reach   GP     Rebeca Alert, MPT Pager: [phone]

## 2013-04-30 NOTE — Progress Notes (Signed)
[facility] PROGRESS NOTE  Sherri Weber ZOX:[id number] DOB: [birth year] DOA: 04/28/2013 PCP: Sanda Linger, MD  Brief History 79 y.o. year old otherwise independent female who presents today with L femoral neck fracture s/p fall. Pt states that she has been in otherwise normal state of health. Pt states she was getting some linens out of the closet when she fell backwards, landing on her L buttock. Pt states that she did not notice pain immediately, but felt significant amount of pain upon ambulation. Pt was brought to ER by a friend. Pt was noted to have a L femoral neck fracture with angulation. Dr. Charlann Boxer from [facility] was called.    Assessment/Plan: Left femoral neck fracture  -s/p left hemiarthroplasty 04/29/2013 -appreciate Dr. Charlann Boxer  -pain control  -PT after surgery  -remove foley 05/01/13 Leukocytosis  -Likely reactive secondary to fracture and surgery -CBC in am Drop in platelets -monitor -may need d/c heparin continues to fall Anxiety/depression  -Continue Zoloft and when necessary Xanax  Rectal prolapse  -Status post colostomy  --+BM in ostomy Overactive bladder  -Continue Ditropan XL  -Patient performs in and out cath at home for history of overactive bladder B12 deficiency  -Check B12--439 -Patient was not on B12 supplementation at home with this pre-existing diagnosis   Active Problems:   Fracture of femoral neck, left    Family Communication:   Pt at beside Disposition Plan:   SNF 05/01/13 or 05/02/13      Procedures/Studies: Dg Chest 1 View  04/28/2013  *RADIOLOGY REPORT*  Clinical Data: Head fracture.  Preoperative evaluation.  CHEST - 1 VIEW  Comparison: Report dated 05/10/2000.  Findings: Normal sized heart.  Minimally elevated left hemidiaphragm.  Small medial right diaphragmatic eventration.  Mild right lower lobe bronchiectasis.  Small amount of linear density at the left lung base.  Unremarkable bones.  IMPRESSION:  1.  Small amount of  linear atelectasis or scarring at the left lung base. 2.  Mild right lower lobe bronchiectasis.   Original Report Authenticated By: Beckie Salts, M.D.    Dg Hip Complete Left  04/28/2013  *RADIOLOGY REPORT*  Clinical Data: Fall, left growing pain, hip pain.  LEFT HIP - COMPLETE 2+ VIEW  Comparison: 06/05/2012  Findings: There is a left femoral neck fracture with varus angulation and impaction.  No subluxation or dislocation.  Early degenerative changes in the hips bilaterally.  SI joints are symmetric and unremarkable.  IMPRESSION: Left femoral neck fracture with impaction and varus angulation.   Original Report Authenticated By: Charlett Nose, M.D.    Dg Pelvis Portable  04/29/2013  *RADIOLOGY REPORT*  Clinical Data: Status post left hip surgery.  PORTABLE PELVIS  Comparison: Plain films 04/28/2013.  Findings: The patient has a new bipolar left hip hemiarthroplasty. The device is located and no fracture is identified.  Gas in the soft tissues from surgery noted.  No new abnormality.  IMPRESSION: Left hip replacement without evidence of complication.   Original Report Authenticated By: Holley Dexter, M.D.          Subjective:   Objective: Filed Vitals:   04/30/13 0337 04/30/13 0716 04/30/13 1010 04/30/13 1400  BP:  112/73 111/68 94/56  Pulse:  94 89 99  Temp:  99.5 F (37.5 C) 97.8 F (36.6 C) 99.7 F (37.6 C)  TempSrc:  Oral    Resp: 16 16 16 16   Height:      Weight:      SpO2: 96% 96% 94% 94%    Intake/Output  Summary (Last 24 hours) at 04/30/13 1658 Last data filed at 04/30/13 1402  Gross per 24 hour  Intake 3081.67 ml  Output   1850 ml  Net 1231.67 ml   Weight change:  Exam:   General:  Pt is alert, follows commands appropriately, not in acute distress  HEENT: No icterus, No thrush, No neck mass, [place]/AT  Cardiovascular: RRR, S1/S2, no rubs, no gallops  Respiratory: CTA bilaterally, no wheezing, no crackles, no rhonchi  Abdomen: Soft/+BS, non tender, non distended,  no guarding  Extremities: No edema, No lymphangitis, No petechiae, No rashes, no synovitis  Data Reviewed: Basic Metabolic Panel:  Recent Labs Lab 04/28/13 1814 04/29/13 0525 04/30/13 0444  NA 136 141 135  K 3.8 4.6 4.4  CL 101 104 102  CO2 26 31 23   GLUCOSE 93 121* 107*  BUN 15 13 8   CREATININE 0.84 0.80 0.62  CALCIUM 8.7 8.7 7.9*   Liver Function Tests:  Recent Labs Lab 04/29/13 0525  AST 16  ALT 10  ALKPHOS 72  BILITOT 0.4  PROT 6.4  ALBUMIN 3.4*   No results found for this basename: LIPASE, AMYLASE,  in the last 168 hours No results found for this basename: AMMONIA,  in the last 168 hours CBC:  Recent Labs Lab 04/28/13 1814 04/29/13 0525 04/30/13 0444  WBC 14.2* 8.5 11.1*  HGB 12.9 12.3 11.4*  HCT 35.9* 35.3* 33.2*  MCV 87.6 88.5 89.5  PLT 246 214 163   Cardiac Enzymes: No results found for this basename: CKTOTAL, CKMB, CKMBINDEX, TROPONINI,  in the last 168 hours BNP: No components found with this basename: POCBNP,  CBG: No results found for this basename: GLUCAP,  in the last 168 hours  Recent Results (from the past 240 hour(s))  SURGICAL PCR SCREEN     Status: None   Collection Time    04/29/13  2:39 PM      Result Value Range Status   MRSA, PCR NEGATIVE  NEGATIVE Final   Staphylococcus aureus NEGATIVE  NEGATIVE Final   Comment:            The Xpert SA Assay (FDA     approved for NASAL specimens     in patients over 84 years of age),     is one component of     a comprehensive surveillance     program.  Test performance has     been validated by [employer] for patients greater     than or equal to 34 year old.     It is not intended     to diagnose infection nor to     guide or monitor treatment.     Scheduled Meds: . docusate sodium  100 mg Oral BID  . ferrous sulfate  325 mg Oral TID PC  . heparin  5,000 Units Subcutaneous Q8H  . oxybutynin  10 mg Oral Daily  . pantoprazole  80 mg Oral Daily  . sertraline  50 mg Oral  Daily   Continuous Infusions: . sodium chloride 50 mL/hr at 04/30/13 0605     Aison Malveaux, DO  [facility] Pager [phone]  If 7PM-7AM, please contact night-coverage [email or web address] Password [email or web address] 04/30/2013, 4:58 PM   LOS: 2 days

## 2013-04-30 NOTE — Evaluation (Signed)
Physical Therapy Evaluation Patient Details Name: Sherri Weber MRN: [id number] DOB: 01-15-1933 Today's Date: 04/30/2013 Time: 4098-1191 PT Time Calculation (min): 25 min  PT Assessment / Plan / Recommendation Clinical Impression  79 yo female s/p L hip hemi. Prior to admission pt was independent with mobility. On eval, pt required +2 assist for mobility-able to ambulate ~10 feet with RW. Recommend SNF for ST rehab to improve strength, activity tolerance, gait and balance.     PT Assessment  Patient needs continued PT services    Follow Up Recommendations  SNF    Does the patient have the potential to tolerate intense rehabilitation      Barriers to Discharge        Equipment Recommendations  Rolling walker with 5" wheels    Recommendations for Other Services OT consult   Frequency Min 3X/week    Precautions / Restrictions Precautions Precautions: Fall;Posterior Hip Precaution Comments: Reviewed and demonstrated 3/3 hip precautions.  Restrictions Weight Bearing Restrictions: No LLE Weight Bearing: Weight bearing as tolerated   Pertinent Vitals/Pain 5/10 L hip      Mobility  Bed Mobility Bed Mobility: Supine to Sit Supine to Sit: 1: +2 Total assist;HOB elevated;With rails Supine to Sit: Patient Percentage: 40% Details for Bed Mobility Assistance: Assist for L LE off bed. Vcs safety, technique, hand placement.  Transfers Transfers: Sit to Stand;Stand to Sit Sit to Stand: 1: +2 Total assist;From bed;With upper extremity assist Sit to Stand: Patient Percentage: 50% Stand to Sit: 1: +2 Total assist;To chair/3-in-1;With armrests;With upper extremity assist Stand to Sit: Patient Percentage: 50% Details for Transfer Assistance: VCs safety, technique, hand placement. Assist to rise, stabilize, control descent. Ambulation/Gait Ambulation/Gait Assistance: 1: +2 Total assist Ambulation/Gait: Patient Percentage: 60% Ambulation Distance (Feet): 10 Feet Assistive device:  Rolling walker Ambulation/Gait Assistance Details: VCs safety, technique, sequence. Assist to stabilize throughout ambulation. followed with recliner. Distance limited by dizziness.  Gait Pattern: Step-to pattern;Antalgic;Decreased stride length;Decreased step length - left    Exercises     PT Diagnosis: Difficulty walking;Abnormality of gait;Acute pain  PT Problem List: Decreased strength;Decreased range of motion;Decreased activity tolerance;Decreased balance;Pain;Decreased knowledge of use of DME;Decreased knowledge of precautions PT Treatment Interventions: DME instruction;Gait training;Functional mobility training;Therapeutic activities;Therapeutic exercise;Balance training;Patient/family education   PT Goals Acute Rehab PT Goals PT Goal Formulation: With patient Time For Goal Achievement: 05/14/13 Potential to Achieve Goals: Good Pt will go Supine/Side to Sit: with min assist PT Goal: Supine/Side to Sit - Progress: Goal set today Pt will go Sit to Supine/Side: with min assist PT Goal: Sit to Supine/Side - Progress: Goal set today Pt will go Sit to Stand: with min assist PT Goal: Sit to Stand - Progress: Goal set today Pt will Ambulate: 51 - 150 feet;with min assist;with rolling walker PT Goal: Ambulate - Progress: Goal set today Pt will Perform Home Exercise Program: with min assist PT Goal: Perform Home Exercise Program - Progress: Goal set today  Visit Information  Last PT Received On: 04/30/13 Assistance Needed: +2    Subjective Data  Subjective: that wasn't too bad Patient Stated Goal: rehab   Prior Functioning  Home Living Lives With: Alone Type of Home: House Home Adaptive Equipment: None Prior Function Level of Independence: Independent Communication Communication: No difficulties    Cognition  Cognition Arousal/Alertness: Awake/alert Behavior During Therapy: WFL for tasks assessed/performed Overall Cognitive Status: Within Functional Limits for tasks  assessed    Extremity/Trunk Assessment Right Lower Extremity Assessment RLE ROM/Strength/Tone: [facility] for tasks assessed Left  Lower Extremity Assessment LLE ROM/Strength/Tone: Deficits LLE ROM/Strength/Tone Deficits: hip abd/add 2/5, hip flex 2/5,moves ankle Trunk Assessment Trunk Assessment: Normal   Balance    End of Session PT - End of Session Equipment Utilized During Treatment: Gait belt Activity Tolerance:  (Limited by dizziness) Patient left: in chair;with call bell/phone within reach;with family/visitor present  GP     Rebeca Alert, MPT Pager: [phone]

## 2013-04-30 NOTE — Progress Notes (Signed)
   Subjective: 1 Day Post-Op Procedure(s) (LRB): ARTHROPLASTY BIPOLAR HIP (Left)   Patient reports pain as mild, pain well controlled. No events throughout the night.   Objective:   VITALS:   Filed Vitals:   04/30/13 1400  BP: 94/56  Pulse: 99  Temp: 99.7 F (37.6 C)  Resp: 16    Neurovascular intact Dorsiflexion/Plantar flexion intact Incision: dressing C/D/I No cellulitis present Compartment soft  LABS  Recent Labs  04/28/13 1814 04/29/13 0525 04/30/13 0444  HGB 12.9 12.3 11.4*  HCT 35.9* 35.3* 33.2*  WBC 14.2* 8.5 11.1*  PLT 246 214 163     Recent Labs  04/28/13 1814 04/29/13 0525 04/30/13 0444  NA 136 141 135  K 3.8 4.6 4.4  BUN 15 13 8   CREATININE 0.84 0.80 0.62  GLUCOSE 93 121* 107*     Assessment/Plan: 1 Day Post-Op Procedure(s) (LRB): ARTHROPLASTY BIPOLAR HIP (Left) HV drain d/c'ed Advance diet Up with therapy Discharge to SNF eventually, when ready   Anastasio Auerbach. Audrianna Driskill   PAC  04/30/2013, 2:55 PM

## 2013-05-01 LAB — BASIC METABOLIC PANEL
BUN: 10 mg/dL (ref 6–23)
CO2: 27 mEq/L (ref 19–32)
Calcium: 8 mg/dL — ABNORMAL LOW (ref 8.4–10.5)
Chloride: 100 mEq/L (ref 96–112)
Creatinine, Ser: 0.71 mg/dL (ref 0.50–1.10)
GFR calc Af Amer: >90 mL/min (ref >90)
GFR calc non Af Amer: 81 mL/min — ABNORMAL LOW (ref >90)
Glucose, Bld: 98 mg/dL (ref 70–99)
Potassium: 3.9 mEq/L (ref 3.5–5.1)
Sodium: 133 mEq/L — ABNORMAL LOW (ref 135–145)

## 2013-05-01 LAB — CBC
HCT: 31.3 % — ABNORMAL LOW (ref 36.0–46.0)
Hemoglobin: 10.6 g/dL — ABNORMAL LOW (ref 12.0–15.0)
MCH: 30.2 pg (ref 26.0–34.0)
MCHC: 33.9 g/dL (ref 30.0–36.0)
MCV: 89.2 fL (ref 78.0–100.0)
Platelets: 161 10*3/uL (ref 150–400)
RBC: 3.51 MIL/uL — ABNORMAL LOW (ref 3.87–5.11)
RDW: 12.9 % (ref 11.5–15.5)
WBC: 12.2 10*3/uL — ABNORMAL HIGH (ref 4.0–10.5)

## 2013-05-01 MED ORDER — HYDROCODONE-ACETAMINOPHEN 5-325 MG PO TABS: 1.0000 | ORAL_TABLET | ORAL | Status: DC | PRN

## 2013-05-01 MED ORDER — ENSURE NUTRITION SHAKE PO LIQD: 1.0000 | Freq: Two times a day (BID) | ORAL | Status: DC

## 2013-05-01 MED ORDER — POLYETHYLENE GLYCOL 3350 17 G PO PACK: 17.0000 g | PACK | Freq: Every day | ORAL | Status: DC | PRN

## 2013-05-01 MED ORDER — DSS 100 MG PO CAPS: 100.0000 mg | ORAL_CAPSULE | Freq: Two times a day (BID) | ORAL | Status: DC

## 2013-05-01 MED ORDER — FERROUS SULFATE 325 (65 FE) MG PO TABS: 325.0000 mg | ORAL_TABLET | Freq: Three times a day (TID) | ORAL | Status: DC

## 2013-05-01 MED ORDER — ASPIRIN EC 325 MG PO TBEC: 325.0000 mg | DELAYED_RELEASE_TABLET | Freq: Two times a day (BID) | ORAL | Status: DC

## 2013-05-01 MED ORDER — ALPRAZOLAM 2 MG PO TABS: 2.0000 mg | ORAL_TABLET | Freq: Three times a day (TID) | ORAL | Status: DC | PRN

## 2013-05-01 NOTE — Progress Notes (Addendum)
   Subjective: 2 Days Post-Op Procedure(s) (LRB): ARTHROPLASTY BIPOLAR HIP (Left)   Patient reports pain as mild, pain well controlled. States that pain increase when she gets up with therapy, but not too bad. No events throughout the night. Ready to be d/c to SNF when ready medically.  Objective:   VITALS:   Filed Vitals:   05/01/13 0522  BP: 103/68  Pulse: 101  Temp: 99.6 F (37.6 C)  Resp: 16    Neurovascular intact Dorsiflexion/Plantar flexion intact Incision: dressing C/D/I No cellulitis present Compartment soft  LABS  Recent Labs  04/29/13 0525 04/30/13 0444 05/01/13 0505  HGB 12.3 11.4* 10.6*  HCT 35.3* 33.2* 31.3*  WBC 8.5 11.1* 12.2*  PLT 214 163 161     Recent Labs  04/29/13 0525 04/30/13 0444 05/01/13 0505  NA 141 135 133*  K 4.6 4.4 3.9  BUN 13 8 10   CREATININE 0.80 0.62 0.71  GLUCOSE 121* 107* 98     Assessment/Plan: 2 Days Post-Op Procedure(s) (LRB): ARTHROPLASTY BIPOLAR HIP (Left) Up with therapy Discharge to SNF when ready medically Orthopaedically stable Defer to medicine for anticoagulation, suggest ASA 325 mg bid for 2 weeks unless other medical issues warrant other Norco for pain, Rx printed Flexeril for muscle spasms, Rx printed WBAT left leg Follow up in 2 weeks at [facility]. Follow up with OLIN,Daneisha Surges D in 2 weeks.  Contact information:  [facility] [address], [address] [place] [address] [phone]        Anastasio Auerbach. Anays Detore   PAC  05/01/2013, 9:14 AM

## 2013-05-01 NOTE — Progress Notes (Signed)
ST Rehab bed is available today at [facility] if pt is stable for d/c. [employer] medicare has provided prior authorization for SNF placement.  Cori Razor LCSW [phone]

## 2013-05-01 NOTE — Discharge Summary (Signed)
[facility]  Physician Discharge Summary   Patient ID: Sherri Weber MRN: [id number] DOB/AGE: [birth year] 79 y.o.  Admit date: 04/28/2013 Discharge date: 05/01/2013  PCP: Sanda Linger, MD  DISCHARGE DIAGNOSES:  Active Problems:   Fracture of femoral neck, left   RECOMMENDATIONS FOR OUTPATIENT FOLLOW UP: 1. Please discontinue foley and allow patient to self catheterize as soon as she is able to do so. We will leave foley in for now.   DISCHARGE CONDITION: fair  Diet recommendation: Low Sodium  Filed Weights   04/28/13 1632 04/28/13 2040  Weight: 60.328 kg (133 lb) 57.788 kg (127 lb 6.4 oz)    INITIAL HISTORY: 79 y.o. year old otherwise independent female who presented with L femoral neck fracture s/p fall. Pt states that she has been in otherwise normal state of health. Pt stated she was getting some linens out of the closet when she fell backwards, landing on her L buttock. Pt stated that she did not notice pain immediately, but felt significant amount of pain upon ambulation. Pt was brought to ER by a friend. Pt was noted to have a L femoral neck fracture with angulation. Dr. Charlann Boxer from [facility] was called.  Consultations:  Dr. Charlann Boxer, Ortho  Procedures:  LEFT THR 5/5  HOSPITAL COURSE:   Left femoral neck fracture  She is s/p left hemiarthroplasty 04/29/2013. Pain is adequately controlled. DVT proph: Ortho recommends Aspirin BID which will be ordered.  Leukocytosis  Likely reactive secondary to fracture and surgery. Continue monitor at SNF depending on clinical picture.  Drop in platelets  Counts stable.   Anxiety/depression  Continue Zoloft and when necessary Xanax   Rectal prolapse  Status post colostomy last year. Stable.   Overactive bladder  Continue Ditropan XL. Patient performs in and out cath at home for history of overactive bladder. Since she is still quite dependant on others will hold off on removing foley. SNF should remove foley she  is able to self catheterize.   B12 deficiency  B12--439. Patient was not on B12 supplementation at home with this pre-existing diagnosis.  Overall she feels well. She remains stable despite low grade fever which is most likely from surgery. This can be monitored at SNF. She is ok for discharge. No wound infection per ortho.   PERTINENT LABS:  The results of significant diagnostics from this hospitalization (including imaging, microbiology, ancillary and laboratory) are listed below for reference.    Microbiology: Recent Results (from the past 240 hour(s))  SURGICAL PCR SCREEN     Status: None   Collection Time    04/29/13  2:39 PM      Result Value Range Status   MRSA, PCR NEGATIVE  NEGATIVE Final   Staphylococcus aureus NEGATIVE  NEGATIVE Final   Comment:            The Xpert SA Assay (FDA     approved for NASAL specimens     in patients over 77 years of age),     is one component of     a comprehensive surveillance     program.  Test performance has     been validated by [employer] for patients greater     than or equal to 6 year old.     It is not intended     to diagnose infection nor to     guide or monitor treatment.     Labs: Basic Metabolic Panel:  Recent Labs Lab 04/28/13 1814  04/29/13 0525 04/30/13 0444 05/01/13 0505  NA 136 141 135 133*  K 3.8 4.6 4.4 3.9  CL 101 104 102 100  CO2 26 31 23 27   GLUCOSE 93 121* 107* 98  BUN 15 13 8 10   CREATININE 0.84 0.80 0.62 0.71  CALCIUM 8.7 8.7 7.9* 8.0*   Liver Function Tests:  Recent Labs Lab 04/29/13 0525  AST 16  ALT 10  ALKPHOS 72  BILITOT 0.4  PROT 6.4  ALBUMIN 3.4*   CBC:  Recent Labs Lab 04/28/13 1814 04/29/13 0525 04/30/13 0444 05/01/13 0505  WBC 14.2* 8.5 11.1* 12.2*  HGB 12.9 12.3 11.4* 10.6*  HCT 35.9* 35.3* 33.2* 31.3*  MCV 87.6 88.5 89.5 89.2  PLT 246 214 163 161    IMAGING STUDIES Dg Chest 1 View  04/28/2013  *RADIOLOGY REPORT*  Clinical Data: Head fracture.   Preoperative evaluation.  CHEST - 1 VIEW  Comparison: Report dated 05/10/2000.  Findings: Normal sized heart.  Minimally elevated left hemidiaphragm.  Small medial right diaphragmatic eventration.  Mild right lower lobe bronchiectasis.  Small amount of linear density at the left lung base.  Unremarkable bones.  IMPRESSION:  1.  Small amount of linear atelectasis or scarring at the left lung base. 2.  Mild right lower lobe bronchiectasis.   Original Report Authenticated By: Beckie Salts, M.D.    Dg Hip Complete Left  04/28/2013  *RADIOLOGY REPORT*  Clinical Data: Fall, left growing pain, hip pain.  LEFT HIP - COMPLETE 2+ VIEW  Comparison: 06/05/2012  Findings: There is a left femoral neck fracture with varus angulation and impaction.  No subluxation or dislocation.  Early degenerative changes in the hips bilaterally.  SI joints are symmetric and unremarkable.  IMPRESSION: Left femoral neck fracture with impaction and varus angulation.   Original Report Authenticated By: Charlett Nose, M.D.    Dg Pelvis Portable  04/29/2013  *RADIOLOGY REPORT*  Clinical Data: Status post left hip surgery.  PORTABLE PELVIS  Comparison: Plain films 04/28/2013.  Findings: The patient has a new bipolar left hip hemiarthroplasty. The device is located and no fracture is identified.  Gas in the soft tissues from surgery noted.  No new abnormality.  IMPRESSION: Left hip replacement without evidence of complication.   Original Report Authenticated By: Holley Dexter, M.D.     DISCHARGE EXAMINATION: Filed Vitals:   04/30/13 1010 04/30/13 1400 04/30/13 2028 05/01/13 0522  BP: 111/68 94/56 117/77 103/68  Pulse: 89 99 111 101  Temp: 97.8 F (36.6 C) 99.7 F (37.6 C) 100 F (37.8 C) 99.6 F (37.6 C)  TempSrc:      Resp: 16 16 18 16   Height:      Weight:      SpO2: 94% 94% 96% 92%   General appearance: alert, cooperative, appears stated age and no distress Resp: clear to auscultation bilaterally Cardio: regular rate and  rhythm, S1, S2 normal, no murmur, click, rub or gallop GI: soft, non-tender; bowel sounds normal; no masses,  no organomegaly and colostomy noted Extremities: extremities normal, atraumatic, no cyanosis or edema Neurologic: A&Ox3. No focal deficits.  DISPOSITION: SNF  Discharge Orders   Future Orders Complete By Expires     Call MD / Call 911  As directed     Comments:      If you experience chest pain or shortness of breath, CALL 911 and be transported to the hospital emergency room.  If you develope a fever above 101 F, pus (white drainage) or increased drainage or  redness at the wound, or calf pain, call your surgeon's office.    Constipation Prevention  As directed     Comments:      Drink plenty of fluids.  Prune juice may be helpful.  You may use a stool softener, such as Colace (over the counter) 100 mg twice a day.  Use MiraLax (over the counter) for constipation as needed.    Diet - low sodium heart healthy  As directed     Diet - low sodium heart healthy  As directed     Discharge instructions  As directed     Comments:      Maintain surgical dressing for 10-14 days, then replace with gauze and tape. Keep the area dry and clean until follow up. Follow up in 2 weeks at [facility]. Call with any questions or concerns.    Discharge instructions  As directed     Comments:      Please discontinue foley and allow patient to self catheterize as soon as she is able to do so. Incentive spirometry multiple times daily.    Increase activity slowly as tolerated  As directed     Increase activity slowly  As directed     Weight bearing as tolerated  As directed       ALLERGIES: No Known Allergies  Current Discharge Medication List    START taking these medications   Details  aspirin EC 325 MG tablet Take 1 tablet (325 mg total) by mouth 2 (two) times daily. For 4 weeks Qty: 30 tablet, Refills: 0    docusate sodium 100 MG CAPS Take 100 mg by mouth 2 (two) times  daily. Qty: 10 capsule    ferrous sulfate 325 (65 FE) MG tablet Take 1 tablet (325 mg total) by mouth 3 (three) times daily after meals.    Nutritional Supplements (ENSURE NUTRITION SHAKE) LIQD Take 1 Can by mouth 2 (two) times daily.    polyethylene glycol (MIRALAX / GLYCOLAX) packet Take 17 g by mouth daily as needed. Qty: 14 each      CONTINUE these medications which have CHANGED   Details  alprazolam (XANAX) 2 MG tablet Take 1 tablet (2 mg total) by mouth 3 (three) times daily as needed for anxiety. Qty: 30 tablet, Refills: 0    HYDROcodone-acetaminophen (NORCO/VICODIN) 5-325 MG per tablet Take 1-2 tablets by mouth every 4 (four) hours as needed for pain. Qty: 100 tablet, Refills: 0      CONTINUE these medications which have NOT CHANGED   Details  cyclobenzaprine (FLEXERIL) 10 MG tablet Take 1 tablet (10 mg total) by mouth 2 (two) times daily as needed for muscle spasms. Qty: 60 tablet, Refills: 11   Associated Diagnoses: Lumbago; Osteoarthrosis, unspecified whether generalized or localized, unspecified site    dexlansoprazole (DEXILANT) 60 MG capsule Take 60 mg by mouth every evening.     oxybutynin (DITROPAN-XL) 10 MG 24 hr tablet Take 10 mg by mouth every evening.    promethazine (PHENERGAN) 25 MG tablet Take 25 mg by mouth every 6 (six) hours as needed for nausea.    sertraline (ZOLOFT) 50 MG tablet Take 50 mg by mouth every evening.    zolpidem (AMBIEN) 10 MG tablet Take 10 mg by mouth at bedtime as needed for sleep.       Follow-up Information   Follow up with Shelda Pal, MD. Schedule an appointment as soon as possible for a visit in 2 weeks.   Contact information:  [address] [place] [address] [phone]       Call Sanda Linger, MD. (As needed)    Contact information:   [address] [place] Ripper [place] [address] [phone]       TOTAL DISCHARGE TIME: 35 mins  [facility]  [facility] Pager [phone]  05/01/2013, 11:30 AM

## 2013-05-01 NOTE — Progress Notes (Signed)
Clinical Social Work Department CLINICAL SOCIAL WORK PLACEMENT NOTE 05/01/2013  Patient:  Sherri Weber, Sherri Weber  Account Number:  [id number] Admit date:  04/28/2013  Clinical Social Worker:  Cori Razor, LCSW  Date/time:  04/29/2013 03:00 PM  Clinical Social Work is seeking post-discharge placement for this patient at the following level of care:   SKILLED NURSING   (*CSW will update this form in Epic as items are completed)   04/29/2013  Patient/family provided with [facility] Department of Clinical Social Work's list of facilities offering this level of care within the geographic area requested by the patient (or if unable, by the patient's family).  04/29/2013  Patient/family informed of their freedom to choose among providers that offer the needed level of care, that participate in Medicare, Medicaid or managed care program needed by the patient, have an available bed and are willing to accept the patient.    Patient/family informed of [facility]' ownership interest in [facility], as well as of the fact that they are under no obligation to receive care at this facility.  PASARR submitted to EDS on 04/29/2013 PASARR number received from [employer] on   FL2 transmitted to all facilities in geographic area requested by pt/family on  04/29/2013 FL2 transmitted to all facilities within larger geographic area on   Patient informed that his/her managed care company has contracts with or will negotiate with  certain facilities, including the following:     Patient/family informed of bed offers received:  04/29/2013 Patient chooses bed at [facility] Physician recommends and patient chooses bed at    Patient to be transferred to [facility] on  05/01/2013 Patient to be transferred to facility by [facility]  The following physician request were entered in Epic:   Additional Comments: Cori Razor LCSW [phone]

## 2013-05-01 NOTE — Progress Notes (Signed)
OT Cancellation Note  Patient Details Name: Sherri Weber MRN: [id number] DOB: 10/24/1933   Cancelled Treatment:    Reason Eval/Treat Not Completed: Other (comment) (screen. Note pt to hopefully d/c soon. Will defer OT to SNF)  Lennox Laity [phone] 05/01/2013, 9:47 AM

## 2013-05-14 ENCOUNTER — Telehealth: Payer: Self-pay

## 2013-05-14 NOTE — Telephone Encounter (Signed)
Phone call from Cora Collum at [facility] requesting when pt had her flu and pneumonia vaccines.  I called pt back and was advised to call back tomorrow since she left for the day.

## 2013-05-15 NOTE — Telephone Encounter (Signed)
Left message regarding immunizations.

## 2013-05-27 ENCOUNTER — Other Ambulatory Visit: Payer: Self-pay | Admitting: Internal Medicine

## 2013-05-28 ENCOUNTER — Other Ambulatory Visit: Payer: Self-pay | Admitting: Internal Medicine

## 2013-05-29 ENCOUNTER — Other Ambulatory Visit: Payer: Self-pay | Admitting: Internal Medicine

## 2013-06-04 ENCOUNTER — Ambulatory Visit (INDEPENDENT_AMBULATORY_CARE_PROVIDER_SITE_OTHER): Payer: Medicare PPO | Admitting: Internal Medicine

## 2013-06-04 ENCOUNTER — Other Ambulatory Visit (INDEPENDENT_AMBULATORY_CARE_PROVIDER_SITE_OTHER): Payer: Medicare PPO

## 2013-06-04 ENCOUNTER — Encounter: Payer: Self-pay | Admitting: Internal Medicine

## 2013-06-04 ENCOUNTER — Ambulatory Visit (INDEPENDENT_AMBULATORY_CARE_PROVIDER_SITE_OTHER)
Admission: RE | Admit: 2013-06-04 | Discharge: 2013-06-04 | Disposition: A | Discharge status: Home / Self Care | Payer: Medicare PPO | Source: Ambulatory Visit | Attending: Internal Medicine | Admitting: Internal Medicine

## 2013-06-04 VITALS — BP 140/70 | HR 98 | Temp 98.3°F | Resp 100

## 2013-06-04 DIAGNOSIS — L02419 Cutaneous abscess of limb, unspecified: Secondary | ICD-10-CM

## 2013-06-04 DIAGNOSIS — L8993 Pressure ulcer of unspecified site, stage 3: Secondary | ICD-10-CM

## 2013-06-04 DIAGNOSIS — L89609 Pressure ulcer of unspecified heel, unspecified stage: Secondary | ICD-10-CM

## 2013-06-04 DIAGNOSIS — M7989 Other specified soft tissue disorders: Secondary | ICD-10-CM

## 2013-06-04 DIAGNOSIS — L89623 Pressure ulcer of left heel, stage 3: Secondary | ICD-10-CM

## 2013-06-04 DIAGNOSIS — L03119 Cellulitis of unspecified part of limb: Secondary | ICD-10-CM

## 2013-06-04 LAB — CBC WITH DIFFERENTIAL/PLATELET
Basophils Absolute: 0.1 10*3/uL (ref 0.0–0.1)
Basophils Relative: 0.7 % (ref 0.0–3.0)
Eosinophils Absolute: 0.2 10*3/uL (ref 0.0–0.7)
Eosinophils Relative: 2.5 % (ref 0.0–5.0)
HCT: 37.6 % (ref 36.0–46.0)
Hemoglobin: 12.4 g/dL (ref 12.0–15.0)
Lymphocytes Relative: 12.8 % (ref 12.0–46.0)
Lymphs Abs: 1 10*3/uL (ref 0.7–4.0)
MCHC: 33 g/dL (ref 30.0–36.0)
MCV: 92.7 fl (ref 78.0–100.0)
Monocytes Absolute: 0.6 10*3/uL (ref 0.1–1.0)
Monocytes Relative: 7.1 % (ref 3.0–12.0)
Neutro Abs: 6.2 10*3/uL (ref 1.4–7.7)
Neutrophils Relative %: 76.9 % (ref 43.0–77.0)
Platelets: 298 10*3/uL (ref 150.0–400.0)
RBC: 4.06 Mil/uL (ref 3.87–5.11)
RDW: 14.1 % (ref 11.5–14.6)
WBC: 8 10*3/uL (ref 4.5–10.5)

## 2013-06-04 LAB — D-DIMER, QUANTITATIVE: D-Dimer, Quant: 0.88 ug/mL-FEU — ABNORMAL HIGH (ref 0.00–0.48)

## 2013-06-04 MED ORDER — CEFUROXIME AXETIL 500 MG PO TABS: 500.0000 mg | ORAL_TABLET | Freq: Two times a day (BID) | ORAL | Status: DC

## 2013-06-04 NOTE — Patient Instructions (Signed)
Cellulitis Cellulitis is an infection of the skin and the tissue beneath it. The infected area is usually red and tender. Cellulitis occurs most often in the arms and lower legs.  CAUSES  Cellulitis is caused by bacteria that enter the skin through cracks or cuts in the skin. The most common types of bacteria that cause cellulitis are Staphylococcus and Streptococcus. SYMPTOMS   Redness and warmth.  Swelling.  Tenderness or pain.  Fever. DIAGNOSIS  Your caregiver can usually determine what is wrong based on a physical exam. Blood tests may also be done. TREATMENT  Treatment usually involves taking an antibiotic medicine. HOME CARE INSTRUCTIONS   Take your antibiotics as directed. Finish them even if you start to feel better.  Keep the infected arm or leg elevated to reduce swelling.  Apply a warm cloth to the affected area up to 4 times per day to relieve pain.  Only take over-the-counter or prescription medicines for pain, discomfort, or fever as directed by your caregiver.  Keep all follow-up appointments as directed by your caregiver. SEEK MEDICAL CARE IF:   You notice red streaks coming from the infected area.  Your red area gets larger or turns dark in color.  Your bone or joint underneath the infected area becomes painful after the skin has healed.  Your infection returns in the same area or another area.  You notice a swollen bump in the infected area.  You develop new symptoms. SEEK IMMEDIATE MEDICAL CARE IF:   You have a fever.  You feel very sleepy.  You develop vomiting or diarrhea.  You have a general ill feeling (malaise) with muscle aches and pains. MAKE SURE YOU:   Understand these instructions.  Will watch your condition.  Will get help right away if you are not doing well or get worse. Document Released: 09/21/2005 Document Revised: 06/12/2012 Document Reviewed: 02/27/2012 Exit[employer] Patient Information 2014 [employer].  

## 2013-06-04 NOTE — Progress Notes (Signed)
Subjective:    Patient ID: Sherri Weber, female    DOB: June 25, 1933, 79 y.o.   MRN: [id number]  HPI  She returns c/o an ulcer on the back of her left heel that started about 3 weeks ago. There is worsening pain and swelling in the area of the ulcer. Her left foot has been "propped up on pillows" for 4 weeks s/p a repair of her left hip fracture. There is improving pain in her left hip.  Review of Systems  Constitutional: Negative.  Negative for fever, chills, diaphoresis and fatigue.  HENT: Negative.   Eyes: Negative.   Respiratory: Negative for cough, chest tightness, shortness of breath and stridor.   Cardiovascular: Negative for chest pain, palpitations and leg swelling.  Gastrointestinal: Negative.  Negative for nausea, vomiting, abdominal pain, diarrhea, constipation and blood in stool.  Endocrine: Negative.   Genitourinary: Negative.   Musculoskeletal: Positive for arthralgias. Negative for myalgias, back pain, joint swelling and gait problem.  Skin: Positive for color change and wound. Negative for pallor and rash.  Allergic/Immunologic: Negative.   Neurological: Negative.   Hematological: Negative.   Psychiatric/Behavioral: Negative.        Objective:   Physical Exam  Vitals reviewed. Constitutional: She is oriented to person, place, and time. Vital signs are normal. She appears well-developed and well-nourished.  Non-toxic appearance. She does not have a sickly appearance. She does not appear ill. No distress.  HENT:  Head: Normocephalic and atraumatic.  Mouth/Throat: Oropharynx is clear and moist. No oropharyngeal exudate.  Eyes: Conjunctivae are normal. Right eye exhibits no discharge. Left eye exhibits no discharge. No scleral icterus.  Neck: Normal range of motion. Neck supple. No JVD present. No tracheal deviation present. No thyromegaly present.  Cardiovascular: Normal rate, regular rhythm, normal heart sounds and intact distal pulses.  Exam reveals no gallop and  no friction rub.   No murmur heard. Pulses:      Carotid pulses are 1+ on the right side, and 1+ on the left side.      Radial pulses are 1+ on the right side, and 1+ on the left side.       Femoral pulses are 1+ on the right side, and 1+ on the left side.      Popliteal pulses are 1+ on the right side, and 1+ on the left side.       Dorsalis pedis pulses are 1+ on the right side, and 1+ on the left side.       Posterior tibial pulses are 1+ on the right side, and 1+ on the left side.  Pulmonary/Chest: Effort normal and breath sounds normal. No stridor. No respiratory distress. She has no wheezes. She has no rales. She exhibits no tenderness.  Abdominal: Soft. Bowel sounds are normal. She exhibits no distension and no mass. There is no tenderness. There is no rebound and no guarding.  Musculoskeletal: Normal range of motion. She exhibits edema. She exhibits no tenderness.       Feet:  The posterior left heel shows a 3 cm deep round ulcer covered with a dark brown eschar, surrounding this is a very faint erythema and warmth over the dorsum of the foot and ankle. There is no exudate, crepitance, streaking. There is 1-2 + pitting edema extending just above the ankle. The calf is not tender or swollen.  Lymphadenopathy:    She has no cervical adenopathy.  Neurological: She is oriented to person, place, and time.  Skin: Skin is warm  and dry. No rash noted. She is not diaphoretic. There is erythema. No pallor.  Psychiatric: She has a normal mood and affect. Her behavior is normal. Judgment and thought content normal.          Assessment & Plan:

## 2013-06-04 NOTE — Assessment & Plan Note (Signed)
I am concerned that she has developed a strep infection so have prescribed ceftin

## 2013-06-04 NOTE — Assessment & Plan Note (Signed)
Plain film is neg for gas, air, osteomyelitis I have asked her to be seen by the wound care clinic

## 2013-06-04 NOTE — Assessment & Plan Note (Signed)
This is most likely caused by the recent surgery, I will check a d-dimer and if it is positive I will get an u/s done to look for a DVT

## 2013-06-06 ENCOUNTER — Emergency Department (HOSPITAL_COMMUNITY)
Admission: EM | Admit: 2013-06-06 | Discharge: 2013-06-06 | Disposition: A | Discharge status: Home / Self Care | Payer: Medicare PPO | Attending: Emergency Medicine | Admitting: Emergency Medicine

## 2013-06-06 ENCOUNTER — Ambulatory Visit: Payer: Medicare PPO | Admitting: Internal Medicine

## 2013-06-06 ENCOUNTER — Encounter (HOSPITAL_COMMUNITY): Payer: Self-pay | Admitting: *Deleted

## 2013-06-06 ENCOUNTER — Encounter (HOSPITAL_BASED_OUTPATIENT_CLINIC_OR_DEPARTMENT_OTHER): Payer: Medicare PPO | Attending: Internal Medicine

## 2013-06-06 DIAGNOSIS — F3289 Other specified depressive episodes: Secondary | ICD-10-CM | POA: Insufficient documentation

## 2013-06-06 DIAGNOSIS — Z79899 Other long term (current) drug therapy: Secondary | ICD-10-CM | POA: Insufficient documentation

## 2013-06-06 DIAGNOSIS — F329 Major depressive disorder, single episode, unspecified: Secondary | ICD-10-CM | POA: Insufficient documentation

## 2013-06-06 DIAGNOSIS — Z800 Family history of malignant neoplasm of digestive organs: Secondary | ICD-10-CM | POA: Insufficient documentation

## 2013-06-06 DIAGNOSIS — Z8739 Personal history of other diseases of the musculoskeletal system and connective tissue: Secondary | ICD-10-CM | POA: Insufficient documentation

## 2013-06-06 DIAGNOSIS — Z8679 Personal history of other diseases of the circulatory system: Secondary | ICD-10-CM | POA: Insufficient documentation

## 2013-06-06 DIAGNOSIS — Y838 Other surgical procedures as the cause of abnormal reaction of the patient, or of later complication, without mention of misadventure at the time of the procedure: Secondary | ICD-10-CM | POA: Insufficient documentation

## 2013-06-06 DIAGNOSIS — Z8742 Personal history of other diseases of the female genital tract: Secondary | ICD-10-CM | POA: Insufficient documentation

## 2013-06-06 DIAGNOSIS — Z8719 Personal history of other diseases of the digestive system: Secondary | ICD-10-CM | POA: Insufficient documentation

## 2013-06-06 DIAGNOSIS — L89609 Pressure ulcer of unspecified heel, unspecified stage: Secondary | ICD-10-CM | POA: Insufficient documentation

## 2013-06-06 DIAGNOSIS — L97529 Non-pressure chronic ulcer of other part of left foot with unspecified severity: Secondary | ICD-10-CM

## 2013-06-06 DIAGNOSIS — L97509 Non-pressure chronic ulcer of other part of unspecified foot with unspecified severity: Secondary | ICD-10-CM | POA: Insufficient documentation

## 2013-06-06 DIAGNOSIS — L8990 Pressure ulcer of unspecified site, unspecified stage: Secondary | ICD-10-CM | POA: Insufficient documentation

## 2013-06-06 DIAGNOSIS — K5900 Constipation, unspecified: Secondary | ICD-10-CM | POA: Insufficient documentation

## 2013-06-06 LAB — BASIC METABOLIC PANEL
BUN: 12 mg/dL (ref 6–23)
CO2: 25 mEq/L (ref 19–32)
Calcium: 8.8 mg/dL (ref 8.4–10.5)
Chloride: 105 mEq/L (ref 96–112)
Creatinine, Ser: 0.72 mg/dL (ref 0.50–1.10)
GFR calc Af Amer: >90 mL/min (ref >90)
GFR calc non Af Amer: 80 mL/min — ABNORMAL LOW (ref >90)
Glucose, Bld: 111 mg/dL — ABNORMAL HIGH (ref 70–99)
Potassium: 4.2 mEq/L (ref 3.5–5.1)
Sodium: 139 mEq/L (ref 135–145)

## 2013-06-06 LAB — CBC WITH DIFFERENTIAL/PLATELET
Basophils Absolute: 0 10*3/uL (ref 0.0–0.1)
Basophils Relative: 1 % (ref 0–1)
Eosinophils Absolute: 0.2 10*3/uL (ref 0.0–0.7)
Eosinophils Relative: 4 % (ref 0–5)
HCT: 35.3 % — ABNORMAL LOW (ref 36.0–46.0)
Hemoglobin: 12 g/dL (ref 12.0–15.0)
Lymphocytes Relative: 16 % (ref 12–46)
Lymphs Abs: 1 10*3/uL (ref 0.7–4.0)
MCH: 30.5 pg (ref 26.0–34.0)
MCHC: 34 g/dL (ref 30.0–36.0)
MCV: 89.8 fL (ref 78.0–100.0)
Monocytes Absolute: 0.7 10*3/uL (ref 0.1–1.0)
Monocytes Relative: 11 % (ref 3–12)
Neutro Abs: 4.3 10*3/uL (ref 1.7–7.7)
Neutrophils Relative %: 69 % (ref 43–77)
Platelets: 244 10*3/uL (ref 150–400)
RBC: 3.93 MIL/uL (ref 3.87–5.11)
RDW: 13.7 % (ref 11.5–15.5)
WBC: 6.1 10*3/uL (ref 4.0–10.5)

## 2013-06-06 MED ORDER — SODIUM CHLORIDE 0.9 % IV SOLN
Freq: Once | INTRAVENOUS | Status: AC
  Administered 2013-06-06: 13:00:00 via INTRAVENOUS

## 2013-06-06 MED ORDER — SODIUM CHLORIDE 0.9 % IV SOLN
3.0000 g | Freq: Once | INTRAVENOUS | Status: AC
  Administered 2013-06-06: 3 g via INTRAVENOUS
  Filled 2013-06-06: qty 3

## 2013-06-06 NOTE — ED Provider Notes (Signed)
History     CSN: [id number]  Arrival date & time 06/06/13  1143   First MD Initiated Contact with Patient 06/06/13 1200      Chief Complaint  Patient presents with  . Wound Infection    pressure ulcer    (Consider location/radiation/quality/duration/timing/severity/associated sxs/prior treatment) Patient is a 79 y.o. female presenting with lower extremity pain. The history is provided by the patient (the pt has an ulcer on her foot and she was sent here by her md).  Foot Pain This is a new problem. The current episode started more than 1 week ago. The problem occurs constantly. The problem has not changed since onset.Pertinent negatives include no chest pain, no abdominal pain and no headaches. Nothing aggravates the symptoms. Nothing relieves the symptoms.    Past Medical History  Diagnosis Date  . Depressive disorder, not elsewhere classified   . Diarrhea   . Other malaise and fatigue   . Abdominal distension   . Hemorrhoids   . Chronic constipation   . Family history of malignant neoplasm of gastrointestinal tract   . Rectal prolapse   . Ulcer of anus and rectum   . Irritable bowel syndrome   . Low back pain   . Osteoarthritis     Past Surgical History  Procedure Laterality Date  . Abdominal hysterectomy    . Cholecystectomy    . Appendectomy    . Hemicolectomy    . Cecectomy    . Colon surgery    . Colostomy    . Hip arthroplasty Left 04/29/2013    Procedure: ARTHROPLASTY BIPOLAR HIP;  Surgeon: Shelda Pal, MD;  Location: [facility];  Service: Orthopedics;  Laterality: Left;    Family History  Problem Relation Age of Onset  . Diabetes Sister   . Stroke Sister   . Mental retardation Maternal Uncle   . Emphysema Mother   . Stroke Father   . Cancer Sister     Colon    History  Substance Use Topics  . Smoking status: Never Smoker   . Smokeless tobacco: Never Used  . Alcohol Use: No     Comment: Social.    OB History   Grav Para Term Preterm Abortions  TAB SAB Ect Mult Living                  Review of Systems  Constitutional: Negative for appetite change and fatigue.  HENT: Negative for congestion, sinus pressure and ear discharge.   Eyes: Negative for discharge.  Respiratory: Negative for cough.   Cardiovascular: Negative for chest pain.  Gastrointestinal: Negative for abdominal pain and diarrhea.  Genitourinary: Negative for frequency and hematuria.  Musculoskeletal: Negative for back pain.       Ulcer on foot  Skin: Negative for rash.  Neurological: Negative for seizures and headaches.  Psychiatric/Behavioral: Negative for hallucinations.    Allergies  Review of patient's allergies indicates no known allergies.  Home Medications   Current Outpatient Rx  Name  Route  Sig  Dispense  Refill  . alprazolam (XANAX) 2 MG tablet   Oral   Take 1 tablet (2 mg total) by mouth 3 (three) times daily as needed for anxiety.   30 tablet   0   . cefUROXime (CEFTIN) 500 MG tablet   Oral   Take 1 tablet (500 mg total) by mouth 2 (two) times daily.   20 tablet   0   . cyclobenzaprine (FLEXERIL) 10 MG tablet   Oral  Take 1 tablet (10 mg total) by mouth 2 (two) times daily as needed for muscle spasms.   60 tablet   11   . dexlansoprazole (DEXILANT) 60 MG capsule   Oral   Take 60 mg by mouth every evening.          Marland Kitchen HYDROcodone-acetaminophen (NORCO/VICODIN) 5-325 MG per tablet   Oral   Take 1 tablet by mouth every 8 (eight) hours as needed for pain.         Marland Kitchen lactose free nutrition (BOOST) LIQD   Oral   Take 237 mLs by mouth 3 (three) times daily between meals.         Marland Kitchen oxybutynin (DITROPAN-XL) 10 MG 24 hr tablet   Oral   Take 10 mg by mouth every evening.         . polyethylene glycol (MIRALAX / GLYCOLAX) packet   Oral   Take 17 g by mouth daily as needed (for constipation).         . promethazine (PHENERGAN) 25 MG tablet   Oral   Take 25 mg by mouth every 6 (six) hours as needed for nausea.          . sertraline (ZOLOFT) 50 MG tablet   Oral   Take 50 mg by mouth every evening.         . zolpidem (AMBIEN) 10 MG tablet   Oral   Take 10 mg by mouth at bedtime as needed for sleep.           BP 110/54  Pulse 86  Temp(Src) 98.2 F (36.8 C) (Oral)  Resp 18  SpO2 95%  Physical Exam  Constitutional: She is oriented to person, place, and time. She appears well-developed.  HENT:  Head: Normocephalic.  Eyes: Conjunctivae and EOM are normal. No scleral icterus.  Neck: Neck supple. No thyromegaly present.  Cardiovascular: Normal rate and regular rhythm.  Exam reveals no gallop and no friction rub.   No murmur heard. Pulmonary/Chest: No stridor. She has no wheezes. She has no rales. She exhibits no tenderness.  Abdominal: She exhibits no distension. There is no tenderness. There is no rebound.  Musculoskeletal: Normal range of motion.  Ulcer on heel of left foot  Lymphadenopathy:    She has no cervical adenopathy.  Neurological: She is oriented to person, place, and time. Coordination normal.  Skin: No rash noted. No erythema.  Psychiatric: She has a normal mood and affect. Her behavior is normal.    ED Course  Procedures (including critical care time)  Labs Reviewed  CBC WITH DIFFERENTIAL - Abnormal; Notable for the following:    HCT 35.3 (*)    All other components within normal limits  BASIC METABOLIC PANEL - Abnormal; Notable for the following:    Glucose, Bld 111 (*)    GFR calc non Af Amer 80 (*)    All other components within normal limits   No results found.   1. Foot ulcer, left, with unspecified severity       MDM  Pt to go to wound center now        Benny Lennert, MD 06/06/13 1419

## 2013-06-06 NOTE — ED Notes (Addendum)
EDP notified of cancer center call results. EDP request to speak with Cancer Center Charge RN. Call arranged and transferred to EDP.

## 2013-06-06 NOTE — ED Notes (Signed)
Pt reports being at [facility] for rehab-recently had L hip surgery.  Pt's family reports noticing pressure ulcer on pt L heel x 2-3 weeks ago with swelling.  Reports calling her PMD Yetta Barre today and was advised to come to the ED for IV abx.  Pt presents with what appears to be a stage II pressure ulcer in her L heel measurinf 2cmx2.5cm.  Swelling and redness also noted on her L foot radiating to her L ankle.  Pt denies pain at this time.

## 2013-06-06 NOTE — ED Notes (Signed)
Wound Care Center called per EDP request to attempt to move-up patient's appointment. Harriett Sine at [facility] states that patient was given earliest appointment. Per Harriett Sine, she has added the patient to the call list for cancelled appointments and will call patient if a cancellation is made. Harriett Sine also encouraged to provide the patient with Center number, [phone] to check with the Center.

## 2013-06-07 NOTE — Progress Notes (Signed)
[facility]  NAME:  Sherri Weber, Sherri Weber             ACCOUNT NO.:  [id number]  MEDICAL RECORD NO.:  [id number]      DATE OF BIRTH:  1933/07/23  PHYSICIAN:  Maxwell Caul, M.D. VISIT DATE:  06/06/2013                                  OFFICE VISIT   FACILITY:  [facility].  HISTORY OF PRESENT ILLNESS:  Sherri Weber is a patient who had a hip fracture in early May.  She was at [facility] for repair.  She was discharged to [facility] on May 6th.  On her 2nd week there, she developed a wound on her heel.  This was on the left heel and was dressed with various dressings that the patient is unsure of the exact type.  In any case, she was discharged home at the end of May. She has had home health care doing the dressings since.  The patient became alarmed and a marked deterioration in the wound earlier this week.  She was in urgent care yesterday and seen by Dr. Yetta Barre for this problem.  An x-ray was done.  The patient did not have evidence of osteomyelitis.  There was no soft tissue gas seen.  The patient had a normal white count of 6.1.  She was given an antibiotic, although I do not exactly see the exact agent.  In any case, because of continued pain and concern, she was seen at [facility] ER today.  She was referred here for review of this.  PAST MEDICAL HISTORY: 1. Recent left femoral neck fracture is noted above.  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 2. History of depression with anxiety. 3. Urge urinary incontinence. 4. B12 deficiency. 5. History of cholecystectomy. 6. Colostomy for severe rectal prolapse.  MEDICATION LIST:  Reviewed.  Includes ASA 325 one tablet twice a day, Colace 100 b.i.d., ferrous sulfate 325 three times daily, MiraLAX 17 g daily, Xanax 2 mg 3 times a day, Flexeril 10 mg twice daily, Dexilant 60 mg daily, Ditropan XL 10 mg  daily, Zoloft 50 daily, and Ambien 10 mg at bedtime.  PHYSICAL EXAMINATION:  The major area here was on the posterior Achilles tendon insertion area of the left heel measuring 2 x 2.1 x 0.1.  This was covered with a adherent black eschar.  There was also a smaller area in a similar distribution on the right heel.  Her dorsalis pedis pulses were palpable.  Her forefoot was warm.  ABI on the left was 1.3, which suggests noncompressible vessels, yet I think her circulation is probably adequate.  IMPRESSION:  Pressure ulcerations on her bilateral heels.  The area on the left heel had an adherent eschar which I "cross hatched" in order to allow better penetration of Santyl.  I also removed the eschar on the right heel.  Both of these areas were dressed with Santyl, Hydrogel, foam, gauze, Kerlix and net.  Orders for changing these were phoned in to Home Health.  She was given a heel offloading Darco sandal bilaterally.  We will see her back in 1 week.  Orders to change the dressings were called to [facility].  ______________________________ Maxwell Caul, M.D.     MGR/MEDQ  D:  06/06/2013  T:  06/07/2013  Job:  [id number]

## 2013-06-11 ENCOUNTER — Telehealth: Payer: Self-pay

## 2013-06-11 NOTE — Telephone Encounter (Signed)
Need to take a look to know what is being treated - OV next week.

## 2013-06-11 NOTE — Telephone Encounter (Signed)
Patient was seen at wound care center by Dr. Jimmey Ralph who advised pt of a toe fungus. He recommended that she called PCP office for a prescription to help with it.

## 2013-06-12 NOTE — Telephone Encounter (Signed)
Daugther notified and will keep appt for mon 6/23

## 2013-06-17 ENCOUNTER — Encounter: Payer: Self-pay | Admitting: Internal Medicine

## 2013-06-17 ENCOUNTER — Ambulatory Visit (INDEPENDENT_AMBULATORY_CARE_PROVIDER_SITE_OTHER): Payer: Medicare PPO | Admitting: Internal Medicine

## 2013-06-17 VITALS — BP 124/80 | HR 93 | Temp 97.4°F | Resp 16 | Ht 60.0 in | Wt 127.2 lb

## 2013-06-17 DIAGNOSIS — Z471 Aftercare following joint replacement surgery: Secondary | ICD-10-CM

## 2013-06-17 DIAGNOSIS — L89609 Pressure ulcer of unspecified heel, unspecified stage: Secondary | ICD-10-CM

## 2013-06-17 DIAGNOSIS — R269 Unspecified abnormalities of gait and mobility: Secondary | ICD-10-CM

## 2013-06-17 DIAGNOSIS — L89600 Pressure ulcer of unspecified heel, unstageable: Secondary | ICD-10-CM

## 2013-06-17 DIAGNOSIS — L8995 Pressure ulcer of unspecified site, unstageable: Secondary | ICD-10-CM

## 2013-06-17 DIAGNOSIS — F329 Major depressive disorder, single episode, unspecified: Secondary | ICD-10-CM

## 2013-06-17 DIAGNOSIS — N318 Other neuromuscular dysfunction of bladder: Secondary | ICD-10-CM

## 2013-06-17 DIAGNOSIS — M1990 Unspecified osteoarthritis, unspecified site: Secondary | ICD-10-CM

## 2013-06-17 DIAGNOSIS — L02419 Cutaneous abscess of limb, unspecified: Secondary | ICD-10-CM

## 2013-06-17 DIAGNOSIS — I10 Essential (primary) hypertension: Secondary | ICD-10-CM

## 2013-06-17 DIAGNOSIS — N3281 Overactive bladder: Secondary | ICD-10-CM

## 2013-06-17 MED ORDER — HYDROCODONE-ACETAMINOPHEN 5-325 MG PO TABS: 1.0000 | ORAL_TABLET | Freq: Three times a day (TID) | ORAL | Status: DC | PRN

## 2013-06-17 MED ORDER — ALPRAZOLAM 2 MG PO TABS: 2.0000 mg | ORAL_TABLET | Freq: Three times a day (TID) | ORAL | Status: DC | PRN

## 2013-06-17 MED ORDER — OXYBUTYNIN CHLORIDE ER 10 MG PO TB24: 10.0000 mg | ORAL_TABLET | Freq: Every evening | ORAL | Status: DC

## 2013-06-17 MED ORDER — CEFUROXIME AXETIL 500 MG PO TABS: 500.0000 mg | ORAL_TABLET | Freq: Two times a day (BID) | ORAL | Status: DC

## 2013-06-17 MED ORDER — TRAZODONE HCL 50 MG PO TABS: 25.0000 mg | ORAL_TABLET | Freq: Every day | ORAL | Status: DC

## 2013-06-17 NOTE — Assessment & Plan Note (Signed)
This is being managed by the [facility]

## 2013-06-17 NOTE — Patient Instructions (Signed)

## 2013-06-17 NOTE — Assessment & Plan Note (Signed)
Will continue norco for pain

## 2013-06-17 NOTE — Assessment & Plan Note (Signed)
Her BP is well controlled 

## 2013-06-17 NOTE — Assessment & Plan Note (Signed)
She will continue the current meds Since she is having insomnia, I have asked her to try trazodone

## 2013-06-17 NOTE — Progress Notes (Signed)
  Subjective:    Patient ID: Sherri Weber, female    DOB: 1933-10-27, 79 y.o.   MRN: [id number]  Arthritis Presents for follow-up visit. She complains of pain. She reports no stiffness, joint swelling or joint warmth. The symptoms have been stable. Affected locations include the left hip. Her pain is at a severity of 3/10. Associated symptoms include fatigue, pain at night and pain while resting. Pertinent negatives include no diarrhea, dry eyes, dry mouth, dysuria, fever, rash, Raynaud's syndrome, uveitis or weight loss. Compliance with total regimen is 76-100%.      Review of Systems  Constitutional: Positive for fatigue. Negative for fever, chills, weight loss, diaphoresis, activity change, appetite change and unexpected weight change.  HENT: Negative.   Eyes: Negative.   Respiratory: Negative.  Negative for cough, chest tightness, shortness of breath, wheezing and stridor.   Cardiovascular: Negative.  Negative for chest pain, palpitations and leg swelling.  Gastrointestinal: Negative for nausea, vomiting, abdominal pain, diarrhea, constipation and blood in stool.  Endocrine: Negative.   Genitourinary: Negative.  Negative for dysuria.  Musculoskeletal: Positive for back pain, arthralgias and arthritis. Negative for myalgias, joint swelling, gait problem and stiffness.  Skin: Positive for wound. Negative for color change, pallor and rash.  Allergic/Immunologic: Negative.   Neurological: Negative.   Hematological: Negative.  Negative for adenopathy. Does not bruise/bleed easily.  Psychiatric/Behavioral: Negative.        Objective:   Physical Exam  Vitals reviewed. Constitutional: She is oriented to person, place, and time. She appears well-developed and well-nourished. No distress.  HENT:  Head: Normocephalic and atraumatic.  Mouth/Throat: Oropharynx is clear and moist. No oropharyngeal exudate.  Eyes: Conjunctivae are normal. Right eye exhibits no discharge. Left eye exhibits  no discharge. No scleral icterus.  Neck: Normal range of motion. Neck supple. No JVD present. No tracheal deviation present. No thyromegaly present.  Cardiovascular: Normal rate, regular rhythm and intact distal pulses.  Exam reveals no gallop and no friction rub.   No murmur heard. Pulmonary/Chest: Breath sounds normal. No stridor. No respiratory distress. She has no wheezes. She has no rales. She exhibits no tenderness.  Abdominal: Soft. Bowel sounds are normal. She exhibits no distension and no mass. There is no tenderness. There is no rebound and no guarding.  Musculoskeletal: Normal range of motion. She exhibits edema (trace edema in BLE). She exhibits no tenderness.  Dressings in place over both heels  Lymphadenopathy:    She has no cervical adenopathy.  Neurological: She is oriented to person, place, and time.  Skin: Skin is warm and dry. No rash noted. She is not diaphoretic. No erythema. No pallor.  Psychiatric: She has a normal mood and affect. Her behavior is normal. Judgment and thought content normal.          Assessment & Plan:

## 2013-06-19 ENCOUNTER — Telehealth: Payer: Self-pay
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
## 2013-06-24 ENCOUNTER — Ambulatory Visit (INDEPENDENT_AMBULATORY_CARE_PROVIDER_SITE_OTHER): Payer: Medicare PPO | Admitting: Internal Medicine

## 2013-06-24 ENCOUNTER — Encounter: Payer: Self-pay | Admitting: Internal Medicine

## 2013-06-24 VITALS — BP 100/70 | HR 76 | Temp 97.7°F | Resp 16

## 2013-06-24 DIAGNOSIS — L8995 Pressure ulcer of unspecified site, unstageable: Secondary | ICD-10-CM

## 2013-06-24 DIAGNOSIS — M1990 Unspecified osteoarthritis, unspecified site: Secondary | ICD-10-CM

## 2013-06-24 DIAGNOSIS — F329 Major depressive disorder, single episode, unspecified: Secondary | ICD-10-CM

## 2013-06-24 DIAGNOSIS — L89609 Pressure ulcer of unspecified heel, unspecified stage: Secondary | ICD-10-CM

## 2013-06-24 DIAGNOSIS — L89600 Pressure ulcer of unspecified heel, unstageable: Secondary | ICD-10-CM

## 2013-06-24 MED ORDER — SERTRALINE HCL 50 MG PO TABS: 100.0000 mg | ORAL_TABLET | Freq: Every day | ORAL | Status: DC

## 2013-06-24 MED ORDER — AMOXICILLIN 500 MG PO CAPS: 500.0000 mg | ORAL_CAPSULE | Freq: Three times a day (TID) | ORAL | Status: DC

## 2013-06-24 MED ORDER — ZOLPIDEM TARTRATE 10 MG PO TABS: 10.0000 mg | ORAL_TABLET | Freq: Every evening | ORAL | Status: DC | PRN

## 2013-06-24 MED ORDER — ALPRAZOLAM 2 MG PO TABS: 2.0000 mg | ORAL_TABLET | Freq: Three times a day (TID) | ORAL | Status: DC | PRN

## 2013-06-24 NOTE — Patient Instructions (Signed)
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
## 2013-06-24 NOTE — Assessment & Plan Note (Signed)
She will continue the current meds for pain 

## 2013-06-24 NOTE — Assessment & Plan Note (Signed)
Rx's were refilled I have asked her to increase the dose of her zoloft

## 2013-06-25 ENCOUNTER — Telehealth: Payer: Self-pay | Admitting: *Deleted
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
## 2013-06-25 NOTE — Telephone Encounter (Signed)
Sherri Weber from [facility] pharmacy states the Rx for Ambien was just filled on 6.14.14 #30 which was a 30 day supply.  The Rx for Xanax was just filled on 6.14.14 as well #65 which was a 22 day supply.  He further states he is not able to fill the Rx written on 6.30.14 without MD override.  However the insurance will not pay for the medications until at least 7.3.14.  Please advise

## 2013-06-26 NOTE — Telephone Encounter (Signed)
Spoke with [name], pt's daughter, advised her that Rx is ok'd to pick up.

## 2013-06-27 ENCOUNTER — Encounter (HOSPITAL_BASED_OUTPATIENT_CLINIC_OR_DEPARTMENT_OTHER): Payer: Medicare PPO | Attending: Internal Medicine

## 2013-06-27 DIAGNOSIS — L89609 Pressure ulcer of unspecified heel, unspecified stage: Secondary | ICD-10-CM | POA: Insufficient documentation

## 2013-06-27 DIAGNOSIS — L8992 Pressure ulcer of unspecified site, stage 2: Secondary | ICD-10-CM | POA: Insufficient documentation

## 2013-07-01 ENCOUNTER — Encounter (HOSPITAL_COMMUNITY): Payer: Self-pay

## 2013-07-01 ENCOUNTER — Emergency Department (HOSPITAL_COMMUNITY)
Admission: EM | Admit: 2013-07-01 | Discharge: 2013-07-01 | Disposition: A | Discharge status: Home / Self Care | Payer: Medicare PPO | Attending: Emergency Medicine | Admitting: Emergency Medicine

## 2013-07-01 DIAGNOSIS — M79609 Pain in unspecified limb: Secondary | ICD-10-CM | POA: Insufficient documentation

## 2013-07-01 DIAGNOSIS — L8992 Pressure ulcer of unspecified site, stage 2: Secondary | ICD-10-CM | POA: Insufficient documentation

## 2013-07-01 DIAGNOSIS — Z79899 Other long term (current) drug therapy: Secondary | ICD-10-CM | POA: Insufficient documentation

## 2013-07-01 DIAGNOSIS — Z8739 Personal history of other diseases of the musculoskeletal system and connective tissue: Secondary | ICD-10-CM | POA: Insufficient documentation

## 2013-07-01 DIAGNOSIS — L89602 Pressure ulcer of unspecified heel, stage 2: Secondary | ICD-10-CM

## 2013-07-01 DIAGNOSIS — Z8719 Personal history of other diseases of the digestive system: Secondary | ICD-10-CM | POA: Insufficient documentation

## 2013-07-01 DIAGNOSIS — Z8679 Personal history of other diseases of the circulatory system: Secondary | ICD-10-CM | POA: Insufficient documentation

## 2013-07-01 DIAGNOSIS — M79604 Pain in right leg: Secondary | ICD-10-CM

## 2013-07-01 DIAGNOSIS — Z96649 Presence of unspecified artificial hip joint: Secondary | ICD-10-CM | POA: Insufficient documentation

## 2013-07-01 DIAGNOSIS — L89609 Pressure ulcer of unspecified heel, unspecified stage: Secondary | ICD-10-CM | POA: Insufficient documentation

## 2013-07-01 LAB — CBC WITH DIFFERENTIAL/PLATELET
Basophils Absolute: 0 10*3/uL (ref 0.0–0.1)
Basophils Relative: 1 % (ref 0–1)
Eosinophils Absolute: 0.1 10*3/uL (ref 0.0–0.7)
Eosinophils Relative: 3 % (ref 0–5)
HCT: 35.8 % — ABNORMAL LOW (ref 36.0–46.0)
Hemoglobin: 12 g/dL (ref 12.0–15.0)
Lymphocytes Relative: 20 % (ref 12–46)
Lymphs Abs: 0.9 10*3/uL (ref 0.7–4.0)
MCH: 30.1 pg (ref 26.0–34.0)
MCHC: 33.5 g/dL (ref 30.0–36.0)
MCV: 89.7 fL (ref 78.0–100.0)
Monocytes Absolute: 0.5 10*3/uL (ref 0.1–1.0)
Monocytes Relative: 10 % (ref 3–12)
Neutro Abs: 3.1 10*3/uL (ref 1.7–7.7)
Neutrophils Relative %: 67 % (ref 43–77)
Platelets: 242 10*3/uL (ref 150–400)
RBC: 3.99 MIL/uL (ref 3.87–5.11)
RDW: 12.9 % (ref 11.5–15.5)
WBC: 4.7 10*3/uL (ref 4.0–10.5)

## 2013-07-01 LAB — COMPREHENSIVE METABOLIC PANEL
ALT: 6 U/L (ref 0–35)
AST: 13 U/L (ref 0–37)
Albumin: 3.1 g/dL — ABNORMAL LOW (ref 3.5–5.2)
Alkaline Phosphatase: 77 U/L (ref 39–117)
BUN: 12 mg/dL (ref 6–23)
CO2: 29 mEq/L (ref 19–32)
Calcium: 9.2 mg/dL (ref 8.4–10.5)
Chloride: 105 mEq/L (ref 96–112)
Creatinine, Ser: 0.77 mg/dL (ref 0.50–1.10)
GFR calc Af Amer: >90 mL/min (ref >90)
GFR calc non Af Amer: 78 mL/min — ABNORMAL LOW (ref >90)
Glucose, Bld: 103 mg/dL — ABNORMAL HIGH (ref 70–99)
Potassium: 4 mEq/L (ref 3.5–5.1)
Sodium: 139 mEq/L (ref 135–145)
Total Bilirubin: 0.3 mg/dL (ref 0.3–1.2)
Total Protein: 6.6 g/dL (ref 6.0–8.3)

## 2013-07-01 MED ORDER — TRAMADOL HCL 50 MG PO TABS: 50.0000 mg | ORAL_TABLET | Freq: Four times a day (QID) | ORAL | Status: DC | PRN

## 2013-07-01 NOTE — ED Notes (Signed)
Patient has bil heel ulcers that she received while in rehab at an [facility]. Patient is currently going to the wound care center for the ulcers and last received treatment 10 days ago. Patient states that the wounds were debrided at that time. Patient states she now has severe bilateral leg aching that she rates 9/10 and has intermittent body aches. Patient states she has been taking Oxycodone 5/325 whith no relief. Patient lasst took at 1300 today.

## 2013-07-01 NOTE — ED Provider Notes (Signed)
History    CSN: [id number] Arrival date & time 07/01/13  1435  First MD Initiated Contact with Patient 07/01/13 1539     Chief Complaint  Patient presents with  . Leg Pain   (Consider location/radiation/quality/duration/timing/severity/associated sxs/prior Treatment) HPI Comments: Patient presents emergency department with chief complaint bilateral lower extremity pain. She states that the pain has been ongoing, but is worsened over the past several days. She complains of a history of bilateral ulcers on her heels, which she developed following a stay at an inpatient rehabilitation facility following a hip surgery. She states that the pain is moderate to severe, and is intermittent. She's been taking Percocet with no relief. She states that she is able to ambulate. She denies fevers, chills, nausea, vomiting, or diarrhea. She states that she does have intermittent constipation, but recently took a laxative, and this helped. She has a stoma.  The history is provided by the patient. No language interpreter was used.   Past Medical History  Diagnosis Date  . Depressive disorder, not elsewhere classified   . Diarrhea   . Other malaise and fatigue   . Abdominal distension   . Hemorrhoids   . Chronic constipation   . Family history of malignant neoplasm of gastrointestinal tract   . Rectal prolapse   . Ulcer of anus and rectum   . Irritable bowel syndrome   . Low back pain   . Osteoarthritis    Past Surgical History  Procedure Laterality Date  . Abdominal hysterectomy    . Cholecystectomy    . Appendectomy    . Hemicolectomy    . Cecectomy    . Colon surgery    . Colostomy    . Hip arthroplasty Left 04/29/2013    Procedure: ARTHROPLASTY BIPOLAR HIP;  Surgeon: Shelda Pal, MD;  Location: [facility];  Service: Orthopedics;  Laterality: Left;   Family History  Problem Relation Age of Onset  . Diabetes Sister   . Stroke Sister   . Mental retardation Maternal Uncle   . Emphysema  Mother   . Stroke Father   . Cancer Sister     Colon   History  Substance Use Topics  . Smoking status: Never Smoker   . Smokeless tobacco: Never Used  . Alcohol Use: No     Comment: Social.   OB History   Grav Para Term Preterm Abortions TAB SAB Ect Mult Living                 Review of Systems  All other systems reviewed and are negative.    Allergies  Review of patient's allergies indicates no known allergies.  Home Medications   Current Outpatient Rx  Name  Route  Sig  Dispense  Refill  . alprazolam (XANAX) 2 MG tablet   Oral   Take 1 tablet (2 mg total) by mouth 3 (three) times daily as needed for anxiety.   90 tablet   3   . amoxicillin (AMOXIL) 500 MG capsule   Oral   Take 1 capsule (500 mg total) by mouth 3 (three) times daily.   30 capsule   0   . cyclobenzaprine (FLEXERIL) 10 MG tablet   Oral   Take 1 tablet (10 mg total) by mouth 2 (two) times daily as needed for muscle spasms.   60 tablet   11   . dexlansoprazole (DEXILANT) 60 MG capsule   Oral   Take 60 mg by mouth every evening.          Marland Kitchen  HYDROcodone-acetaminophen (NORCO/VICODIN) 5-325 MG per tablet   Oral   Take 1 tablet by mouth every 8 (eight) hours as needed for pain.   90 tablet   3   . lactose free nutrition (BOOST) LIQD   Oral   Take 237 mLs by mouth 3 (three) times daily between meals.         Marland Kitchen oxybutynin (DITROPAN-XL) 10 MG 24 hr tablet   Oral   Take 1 tablet (10 mg total) by mouth every evening.   90 tablet   3   . polyethylene glycol (MIRALAX / GLYCOLAX) packet   Oral   Take 17 g by mouth daily as needed (for constipation).         . promethazine (PHENERGAN) 25 MG tablet   Oral   Take 25 mg by mouth every 6 (six) hours as needed for nausea.         . sertraline (ZOLOFT) 50 MG tablet   Oral   Take 2 tablets (100 mg total) by mouth daily.   180 tablet   2   . traZODone (DESYREL) 50 MG tablet   Oral   Take 0.5-1 tablets (25-50 mg total) by mouth at  bedtime.   60 tablet   5   . zolpidem (AMBIEN) 10 MG tablet   Oral   Take 1 tablet (10 mg total) by mouth at bedtime as needed for sleep.   30 tablet   5    BP 123/73  Pulse 83  Temp(Src) 98.6 F (37 C) (Oral)  Resp 16  SpO2 95% Physical Exam  Nursing note and vitals reviewed. Constitutional: She is oriented to person, place, and time. She appears well-developed and well-nourished.  HENT:  Head: Normocephalic and atraumatic.  Eyes: Conjunctivae and EOM are normal. Pupils are equal, round, and reactive to light.  Neck: Normal range of motion. Neck supple.  Cardiovascular: Normal rate, regular rhythm and intact distal pulses.  Exam reveals no gallop and no friction rub.   No murmur heard. Brisk cap refill  Pulmonary/Chest: Effort normal and breath sounds normal. No respiratory distress. She has no wheezes. She has no rales. She exhibits no tenderness.  Abdominal: Soft. Bowel sounds are normal. She exhibits no distension and no mass. There is no tenderness. There is no rebound and no guarding.  Musculoskeletal: Normal range of motion. She exhibits no edema and no tenderness.  No pain to palpation of LEs, no bony deformities or abnormalities.  Neurological: She is alert and oriented to person, place, and time.  Skin: Skin is warm and dry.  Left foot ulcer, no notable drainage or discharge  Psychiatric: She has a normal mood and affect. Her behavior is normal. Judgment and thought content normal.    ED Course  Procedures (including critical care time) Labs Reviewed  CBC WITH DIFFERENTIAL  COMPREHENSIVE METABOLIC PANEL   Results for orders placed during the hospital encounter of 07/01/13  CBC WITH DIFFERENTIAL      Result Value Range   WBC 4.7  4.0 - 10.5 K/uL   RBC 3.99  3.87 - 5.11 MIL/uL   Hemoglobin 12.0  12.0 - 15.0 g/dL   HCT 16.1 (*) 09.6 - 04.5 %   MCV 89.7  78.0 - 100.0 fL   MCH 30.1  26.0 - 34.0 pg   MCHC 33.5  30.0 - 36.0 g/dL   RDW 40.9  81.1 - 91.4 %    Platelets 242  150 - 400 K/uL   Neutrophils Relative %  67  43 - 77 %   Neutro Abs 3.1  1.7 - 7.7 K/uL   Lymphocytes Relative 20  12 - 46 %   Lymphs Abs 0.9  0.7 - 4.0 K/uL   Monocytes Relative 10  3 - 12 %   Monocytes Absolute 0.5  0.1 - 1.0 K/uL   Eosinophils Relative 3  0 - 5 %   Eosinophils Absolute 0.1  0.0 - 0.7 K/uL   Basophils Relative 1  0 - 1 %   Basophils Absolute 0.0  0.0 - 0.1 K/uL  COMPREHENSIVE METABOLIC PANEL      Result Value Range   Sodium 139  135 - 145 mEq/L   Potassium 4.0  3.5 - 5.1 mEq/L   Chloride 105  96 - 112 mEq/L   CO2 29  19 - 32 mEq/L   Glucose, Bld 103 (*) 70 - 99 mg/dL   BUN 12  6 - 23 mg/dL   Creatinine, Ser 1.61  0.50 - 1.10 mg/dL   Calcium 9.2  8.4 - 09.6 mg/dL   Total Protein 6.6  6.0 - 8.3 g/dL   Albumin 3.1 (*) 3.5 - 5.2 g/dL   AST 13  0 - 37 U/L   ALT 6  0 - 35 U/L   Alkaline Phosphatase 77  39 - 117 U/L   Total Bilirubin 0.3  0.3 - 1.2 mg/dL   GFR calc non Af Amer 78 (*) >90 mL/min   GFR calc Af Amer >90  >90 mL/min   Dg Foot Complete Left  06/04/2013   *RADIOLOGY REPORT*  Clinical Data: History of ulceration.  Evaluate for gas and soft tissues.  LEFT FOOT - COMPLETE 3+ VIEW  Comparison: 06/05/2012.  Findings: On the lateral image the site of the wound is marked as the posterior aspect of medial soft tissues.  No definite soft tissue gas is seen.  There is osteopenic appearance of the bones. No fracture or bony destruction is seen.  Toe as ordered dorsally extended at the MTP joints.  IMPRESSION: On the lateral image the site of the wound is marked as the posterior aspect of medial soft tissues.  No definite soft tissue gas is seen. No underlying bone destruction is demonstrated.  There is osteopenic appearance of the bones.   Original Report Authenticated By: Onalee Hua Call     1. Leg pain, bilateral   2. Decubitus ulcer, heel, unspecified laterality, stage II     MDM  Patient with bilateral leg pain, as well as foot ulcer. She is followed  by wound care. She has followup appointment on Thursday. She states that she's been taking Percocet for pain, but this no longer helping with her symptoms.  Ulcer does not appear to be infected. Patient seen by and discussed with Dr. Deretha Emory. Will discharge the patient to home with primary care followup. Wound dressing is changed. Patient is stable and ready for discharge.  Roxy Horseman, PA-C 07/01/13 1735  Roxy Horseman, PA-C 07/01/13 1736

## 2013-07-01 NOTE — ED Notes (Signed)
Left heel ulcerwith small amount green drainage on bandage.

## 2013-07-01 NOTE — ED Provider Notes (Signed)
Medical screening examination/treatment/procedure(s) were conducted as a shared visit with non-physician practitioner(s) and myself.  I personally evaluated the patient during the encounter  The patient seen by me. Patient with left heel decubitus ulcer slow to heal. Undergoing wound care treatment record needed by primary care Dr. Patient with bilateral leg pain that is unexplained. No vascular insufficiency Refill in both feet is 2 seconds. No evidence of cellulitis or evidence of deep infection. No we'll increase pain around the heel. Would recommend continue followup with wound care clinic which she is currently undergoing. Labs without any specific abnormalities no leukocytosis.    Shelda Jakes, MD 07/01/13 [phone]

## 2013-07-01 NOTE — ED Provider Notes (Signed)
Medical screening examination/treatment/procedure(s) were conducted as a shared visit with non-physician practitioner(s) and myself.  I personally evaluated the patient during the encounter   Shelda Jakes, MD 07/01/13 [phone]

## 2013-07-02 ENCOUNTER — Ambulatory Visit (INDEPENDENT_AMBULATORY_CARE_PROVIDER_SITE_OTHER): Payer: Medicare PPO | Admitting: Internal Medicine

## 2013-07-02 ENCOUNTER — Encounter: Payer: Self-pay | Admitting: Internal Medicine

## 2013-07-02 VITALS — BP 100/70 | HR 76 | Temp 98.4°F | Resp 16 | Wt 122.0 lb

## 2013-07-02 DIAGNOSIS — M545 Low back pain, unspecified: Secondary | ICD-10-CM

## 2013-07-02 DIAGNOSIS — M1990 Unspecified osteoarthritis, unspecified site: Secondary | ICD-10-CM

## 2013-07-02 DIAGNOSIS — K5909 Other constipation: Secondary | ICD-10-CM

## 2013-07-02 MED ORDER — FENTANYL 12 MCG/HR TD PT72: 1.0000 | MEDICATED_PATCH | TRANSDERMAL | Status: DC

## 2013-07-02 NOTE — Patient Instructions (Signed)
Back Pain, Adult  Low back pain is very common. About 1 in 5 people have back pain. The cause of low back pain is rarely dangerous. The pain often gets better over time. About half of people with a sudden onset of back pain feel better in just 2 weeks. About 8 in 10 people feel better by 6 weeks.   CAUSES  Some common causes of back pain include:  · Strain of the muscles or ligaments supporting the spine.  · Wear and tear (degeneration) of the spinal discs.  · Arthritis.  · Direct injury to the back.  DIAGNOSIS  Most of the time, the direct cause of low back pain is not known. However, back pain can be treated effectively even when the exact cause of the pain is unknown. Answering your caregiver's questions about your overall health and symptoms is one of the most accurate ways to make sure the cause of your pain is not dangerous. If your caregiver needs more information, he or she may order lab work or imaging tests (X-rays or MRIs). However, even if imaging tests show changes in your back, this usually does not require surgery.  HOME CARE INSTRUCTIONS  For many people, back pain returns. Since low back pain is rarely dangerous, it is often a condition that people can learn to manage on their own.   · Remain active. It is stressful on the back to sit or stand in one place. Do not sit, drive, or stand in one place for more than 30 minutes at a time. Take short walks on level surfaces as soon as pain allows. Try to increase the length of time you walk each day.  · Do not stay in bed. Resting more than 1 or 2 days can delay your recovery.  · Do not avoid exercise or work. Your body is made to move. It is not dangerous to be active, even though your back may hurt. Your back will likely heal faster if you return to being active before your pain is gone.  · Pay attention to your body when you  bend and lift. Many people have less discomfort when lifting if they bend their knees, keep the load close to their bodies, and  avoid twisting. Often, the most comfortable positions are those that put less stress on your recovering back.  · Find a comfortable position to sleep. Use a firm mattress and lie on your side with your knees slightly bent. If you lie on your back, put a pillow under your knees.  · Only take over-the-counter or prescription medicines as directed by your caregiver. Over-the-counter medicines to reduce pain and inflammation are often the most helpful. Your caregiver may prescribe muscle relaxant drugs. These medicines help dull your pain so you can more quickly return to your normal activities and healthy exercise.  · Put ice on the injured area.  · Put ice in a plastic bag.  · Place a towel between your skin and the bag.  · Leave the ice on for 15-20 minutes, 3-4 times a day for the first 2 to 3 days. After that, ice and heat may be alternated to reduce pain and spasms.  · Ask your caregiver about trying back exercises and gentle massage. This may be of some benefit.  · Avoid feeling anxious or stressed. Stress increases muscle tension and can worsen back pain. It is important to recognize when you are anxious or stressed and learn ways to manage it. Exercise is a great option.  SEEK MEDICAL CARE IF:  · You have pain that is not relieved with rest or   medicine.  · You have pain that does not improve in 1 week.  · You have new symptoms.  · You are generally not feeling well.  SEEK IMMEDIATE MEDICAL CARE IF:   · You have pain that radiates from your back into your legs.  · You develop new bowel or bladder control problems.  · You have unusual weakness or numbness in your arms or legs.  · You develop nausea or vomiting.  · You develop abdominal pain.  · You feel faint.  Document Released: 12/12/2005 Document Revised: 06/12/2012 Document Reviewed: 05/02/2011  Exit[employer]® Patient Information ©2014 [employer].

## 2013-07-02 NOTE — Assessment & Plan Note (Signed)
She needs additional relief from her pain so I have added fentanyl to the hydrocodone for additional pain relief

## 2013-07-02 NOTE — Progress Notes (Signed)
  Subjective:    Patient ID: Sherri Weber, female    DOB: 1933-11-19, 79 y.o.   MRN: [id number]  Arthritis Presents for follow-up visit. She complains of pain. She reports no stiffness, joint swelling or joint warmth. The symptoms have been worsening. Affected locations include the left knee and right knee. Her pain is at a severity of 6/10. Associated symptoms include fatigue, pain at night and pain while resting. Pertinent negatives include no diarrhea, dry eyes, dry mouth, dysuria, fever, rash, Raynaud's syndrome, uveitis or weight loss. Compliance with total regimen is 76-100%.      Review of Systems  Constitutional: Positive for fatigue. Negative for fever, chills, weight loss, diaphoresis, activity change, appetite change and unexpected weight change.  HENT: Negative.   Eyes: Negative.   Respiratory: Negative.  Negative for cough, chest tightness, shortness of breath, wheezing and stridor.   Cardiovascular: Negative.  Negative for chest pain, palpitations and leg swelling.  Gastrointestinal: Positive for constipation. Negative for nausea, vomiting, abdominal pain, diarrhea, blood in stool, abdominal distention and anal bleeding.  Endocrine: Negative.   Genitourinary: Negative.  Negative for dysuria.  Musculoskeletal: Positive for back pain, arthralgias and arthritis. Negative for myalgias, joint swelling, gait problem and stiffness.  Skin: Positive for wound (heel ulcers have not changed). Negative for color change, pallor and rash.  Allergic/Immunologic: Negative.   Neurological: Negative for dizziness, tremors, weakness and headaches.  Hematological: Negative.  Negative for adenopathy. Does not bruise/bleed easily.  Psychiatric/Behavioral: Negative.        Objective:   Physical Exam  Constitutional: She is oriented to person, place, and time. She appears well-developed and well-nourished.  Non-toxic appearance. She does not have a sickly appearance. She appears ill (wheel  chair bound). No distress.  HENT:  Head: Normocephalic and atraumatic.  Eyes: Conjunctivae are normal. Right eye exhibits no discharge. Left eye exhibits no discharge. No scleral icterus.  Neck: Normal range of motion. Neck supple. No JVD present. No tracheal deviation present. No thyromegaly present.  Cardiovascular: Normal rate, regular rhythm, normal heart sounds and intact distal pulses.  Exam reveals no gallop and no friction rub.   No murmur heard. Pulmonary/Chest: Effort normal and breath sounds normal. No stridor. No respiratory distress. She has no wheezes. She has no rales. She exhibits no tenderness.  Abdominal: Soft. Bowel sounds are normal. She exhibits no distension and no mass. There is no tenderness. There is no rebound and no guarding.  Musculoskeletal: Normal range of motion. She exhibits no edema and no tenderness.       Right knee: Normal.       Left knee: Normal.       Lumbar back: Normal. She exhibits normal range of motion, no tenderness, no bony tenderness, no swelling, no edema and no deformity.  Lymphadenopathy:    She has no cervical adenopathy.  Neurological: She is oriented to person, place, and time.  Skin: Skin is warm and dry. No rash noted. No erythema. No pallor.  Psychiatric: She has a normal mood and affect. Her behavior is normal. Judgment and thought content normal.          Assessment & Plan:

## 2013-07-02 NOTE — Assessment & Plan Note (Signed)
I will add fentanyl to the hydrocodone for additional pain relief

## 2013-07-02 NOTE — Assessment & Plan Note (Signed)
She is using dulcolax and PEG for this

## 2013-07-11 ENCOUNTER — Other Ambulatory Visit (HOSPITAL_BASED_OUTPATIENT_CLINIC_OR_DEPARTMENT_OTHER): Payer: Self-pay | Admitting: Internal Medicine

## 2013-07-11 ENCOUNTER — Ambulatory Visit (HOSPITAL_COMMUNITY)
Admission: RE | Admit: 2013-07-11 | Discharge: 2013-07-11 | Disposition: A | Discharge status: Home / Self Care | Payer: Medicare PPO | Source: Ambulatory Visit | Attending: Internal Medicine | Admitting: Internal Medicine

## 2013-07-11 DIAGNOSIS — M79673 Pain in unspecified foot: Secondary | ICD-10-CM

## 2013-07-11 DIAGNOSIS — X58XXXA Exposure to other specified factors, initial encounter: Secondary | ICD-10-CM | POA: Insufficient documentation

## 2013-07-11 DIAGNOSIS — S91309A Unspecified open wound, unspecified foot, initial encounter: Secondary | ICD-10-CM | POA: Insufficient documentation

## 2013-07-11 DIAGNOSIS — M899 Disorder of bone, unspecified: Secondary | ICD-10-CM | POA: Insufficient documentation

## 2013-07-30 ENCOUNTER — Other Ambulatory Visit (INDEPENDENT_AMBULATORY_CARE_PROVIDER_SITE_OTHER): Payer: Medicare PPO

## 2013-07-30 ENCOUNTER — Ambulatory Visit (INDEPENDENT_AMBULATORY_CARE_PROVIDER_SITE_OTHER): Payer: Medicare PPO | Admitting: Internal Medicine

## 2013-07-30 ENCOUNTER — Encounter: Payer: Self-pay | Admitting: Internal Medicine

## 2013-07-30 VITALS — BP 84/42 | HR 76 | Temp 97.5°F | Resp 16 | Wt 124.0 lb

## 2013-07-30 DIAGNOSIS — R9431 Abnormal electrocardiogram [ECG] [EKG]: Secondary | ICD-10-CM | POA: Insufficient documentation

## 2013-07-30 DIAGNOSIS — D518 Other vitamin B12 deficiency anemias: Secondary | ICD-10-CM

## 2013-07-30 DIAGNOSIS — D519 Vitamin B12 deficiency anemia, unspecified: Secondary | ICD-10-CM

## 2013-07-30 DIAGNOSIS — I10 Essential (primary) hypertension: Secondary | ICD-10-CM

## 2013-07-30 DIAGNOSIS — I959 Hypotension, unspecified: Secondary | ICD-10-CM

## 2013-07-30 DIAGNOSIS — R609 Edema, unspecified: Secondary | ICD-10-CM

## 2013-07-30 LAB — CORTISOL: Cortisol, Plasma: 5.5 ug/dL

## 2013-07-30 LAB — COMPREHENSIVE METABOLIC PANEL
ALT: 10 U/L (ref 0–35)
AST: 17 U/L (ref 0–37)
Albumin: 3.4 g/dL — ABNORMAL LOW (ref 3.5–5.2)
Alkaline Phosphatase: 70 U/L (ref 39–117)
BUN: 13 mg/dL (ref 6–23)
CO2: 28 mEq/L (ref 19–32)
Calcium: 8.6 mg/dL (ref 8.4–10.5)
Chloride: 107 mEq/L (ref 96–112)
Creatinine, Ser: 0.9 mg/dL (ref 0.4–1.2)
GFR: 64.33 mL/min (ref >60.00)
Glucose, Bld: 86 mg/dL (ref 70–99)
Potassium: 4.6 mEq/L (ref 3.5–5.1)
Sodium: 140 mEq/L (ref 135–145)
Total Bilirubin: 0.4 mg/dL (ref 0.3–1.2)
Total Protein: 6.3 g/dL (ref 6.0–8.3)

## 2013-07-30 LAB — FOLATE: Folate: 11.7 ng/mL (ref >5.9)

## 2013-07-30 LAB — CBC WITH DIFFERENTIAL/PLATELET
Basophils Absolute: 0 10*3/uL (ref 0.0–0.1)
Basophils Relative: 0.4 % (ref 0.0–3.0)
Eosinophils Absolute: 0.2 10*3/uL (ref 0.0–0.7)
Eosinophils Relative: 3.3 % (ref 0.0–5.0)
HCT: 37.2 % (ref 36.0–46.0)
Hemoglobin: 12.5 g/dL (ref 12.0–15.0)
Lymphocytes Relative: 17.4 % (ref 12.0–46.0)
Lymphs Abs: 1 10*3/uL (ref 0.7–4.0)
MCHC: 33.7 g/dL (ref 30.0–36.0)
MCV: 89.8 fl (ref 78.0–100.0)
Monocytes Absolute: 0.5 10*3/uL (ref 0.1–1.0)
Monocytes Relative: 8.1 % (ref 3.0–12.0)
Neutro Abs: 4.1 10*3/uL (ref 1.4–7.7)
Neutrophils Relative %: 70.8 % (ref 43.0–77.0)
Platelets: 262 10*3/uL (ref 150.0–400.0)
RBC: 4.14 Mil/uL (ref 3.87–5.11)
RDW: 13.3 % (ref 11.5–14.6)
WBC: 5.8 10*3/uL (ref 4.5–10.5)

## 2013-07-30 LAB — VITAMIN B12: Vitamin B-12: >1500 pg/mL — ABNORMAL HIGH (ref 211–911)

## 2013-07-30 LAB — IBC PANEL
Iron: 69 ug/dL (ref 42–145)
Saturation Ratios: 26.4 % (ref 20.0–50.0)
Transferrin: 186.5 mg/dL — ABNORMAL LOW (ref 212.0–360.0)

## 2013-07-30 LAB — FERRITIN: Ferritin: 53.3 ng/mL (ref 10.0–291.0)

## 2013-07-30 LAB — BRAIN NATRIURETIC PEPTIDE: Pro B Natriuretic peptide (BNP): 84 pg/mL (ref 0.0–100.0)

## 2013-07-30 LAB — TSH: TSH: 4.53 u[IU]/mL (ref 0.35–5.50)

## 2013-07-30 MED ORDER — CYANOCOBALAMIN 1000 MCG/ML IJ SOLN
1000.0000 ug | Freq: Once | INTRAMUSCULAR | Status: AC
  Administered 2013-07-30: 1000 ug via INTRAMUSCULAR

## 2013-07-30 NOTE — Progress Notes (Signed)
  Subjective:    Patient ID: Sherri Weber, female    DOB: [birth year], 79 y.o.   MRN: [id number]  Hypertension This is a chronic problem. The current episode started more than 1 year ago. The problem is unchanged. The problem is controlled. Associated symptoms include anxiety, malaise/fatigue and peripheral edema. Pertinent negatives include no blurred vision, chest pain, headaches, neck pain, orthopnea, palpitations, PND, shortness of breath or sweats. Past treatments include nothing. The current treatment provides significant improvement. There are no compliance problems.       Review of Systems  Constitutional: Positive for malaise/fatigue and fatigue. Negative for fever, chills, diaphoresis, activity change, appetite change and unexpected weight change.  HENT: Negative.  Negative for neck pain.   Eyes: Negative.  Negative for blurred vision.  Respiratory: Negative.  Negative for cough, choking, chest tightness, shortness of breath, wheezing and stridor.   Cardiovascular: Negative.  Negative for chest pain, palpitations, orthopnea, leg swelling and PND.  Gastrointestinal: Negative.  Negative for nausea, vomiting, abdominal pain, diarrhea and constipation.  Endocrine: Negative.   Genitourinary: Negative.   Musculoskeletal: Negative.  Negative for myalgias, back pain, joint swelling and gait problem.  Skin: Negative.   Allergic/Immunologic: Negative.   Neurological: Positive for dizziness, weakness (all over) and light-headedness. Negative for tremors, syncope, facial asymmetry, speech difficulty, numbness and headaches.  Hematological: Negative.  Negative for adenopathy. Does not bruise/bleed easily.  Psychiatric/Behavioral: Negative.        Objective:   Physical Exam  Vitals reviewed. Constitutional: She is oriented to person, place, and time. She appears well-developed and well-nourished. No distress.  HENT:  Head: Normocephalic and atraumatic.  Mouth/Throat: Oropharynx is  clear and moist. No oropharyngeal exudate.  Eyes: Conjunctivae are normal. Right eye exhibits no discharge. Left eye exhibits no discharge. No scleral icterus.  Neck: Normal range of motion. Neck supple. No JVD present. No tracheal deviation present. No thyromegaly present.  Cardiovascular: Normal rate, regular rhythm, normal heart sounds and intact distal pulses.  Exam reveals no gallop and no friction rub.   No murmur heard. Pulmonary/Chest: Effort normal and breath sounds normal. No stridor. No respiratory distress. She has no wheezes. She has no rales. She exhibits no tenderness.  Abdominal: Soft. Bowel sounds are normal. She exhibits no distension and no mass. There is no tenderness. There is no rebound and no guarding.  Musculoskeletal: Normal range of motion. She exhibits edema (1+ pitting edema in BLE). She exhibits no tenderness.  Lymphadenopathy:    She has no cervical adenopathy.  Neurological: She is oriented to person, place, and time.  Skin: Skin is warm and dry. No rash noted. She is not diaphoretic. No erythema. No pallor.  Psychiatric: She has a normal mood and affect. Her behavior is normal. Judgment and thought content normal.      Lab Results  Component Value Date   WBC 4.7 07/01/2013   HGB 12.0 07/01/2013   HCT 35.8* 07/01/2013   PLT 242 07/01/2013   GLUCOSE 103* 07/01/2013   CHOL 175 04/23/2012   TRIG 183.0* 04/23/2012   HDL 34.50* 04/23/2012   LDLDIRECT 65.0 02/28/2012   LDLCALC 104* 04/23/2012   ALT 6 07/01/2013   AST 13 07/01/2013   NA 139 07/01/2013   K 4.0 07/01/2013   CL 105 07/01/2013   CREATININE 0.77 07/01/2013   BUN 12 07/01/2013   CO2 29 07/01/2013   TSH 2.23 06/05/2012   INR 0.95 04/28/2013      Assessment & Plan:

## 2013-07-30 NOTE — Patient Instructions (Signed)
Hypotension  As your heart beats, it forces blood through your arteries. This force is your blood pressure. If your blood pressure is too low for you to go about your normal activities or support the organs of your body, you have hypotension, or low blood pressure. When your blood pressure becomes too low, you may not get enough blood to your brain, and you may feel weak, lightheaded, or develop a more rapid heart rate. In a more severe case, you may faint: this is a sudden, brief loss of consciousness where you pass out and recover completely.  CAUSES   Loss of blood or fluids from the body. This occurs during rapid blood loss. It can also come from dehydration when the body is not taking in enough fluids or is losing fluids faster than they can be replaced. Examples of this would be severe vomiting and diarrhea.   Not taking in enough fluids and salts. This is common in the elderly where thirst mechanisms are not working as well. This means you do not feel thirsty and you do not take in enough water.   Use of blood pressure pills and other medications that may lower the blood pressure below normal.   Over medication (always take your medications as directed).   Irregular heart beat or heart failure when the heart is no longer working well enough to support blood pressure.  Hospitalization is sometimes required for low blood pressure if fluid or blood replacement is needed, if time is needed for medications to wear off, or if further evaluation is needed. Less common causes of low blood pressure might include peripheral or autonomic neuropathy (nerve problems), Parkinson's disease, or other illnesses. Treatment might include a change in diet, change in medications (including medicines aimed at raising your blood pressure), and use of support stockings.  HOME CARE INSTRUCTIONS    Maintain good fluid intake and use a little more salt on your food (if you are not on a restricted diet or having problems with your  heart such as heart failure). This is especially important for the elderly when you may not feel thirsty in the winter.   Take your medications as directed.   Get up slowly from reclining or sitting positions. This gives your blood pressure a chance to adjust. As we grow older our ability to regulate our blood pressure may not be as good as when we were younger.   Wear support stockings if prescribed.   Use walkers, canes, etc., if advised.   Talk with your physician or nurse about a Home Safety Evaluation (usually done by visiting nurses).  SEEK IMMEDIATE MEDICAL CARE IF:    You have a fainting episode. Do not drive yourself. Call 911 if no other help is available.   You have chest pain, nausea (feeling sick to your stomach) or vomiting.   You have a loss of feeling in some part of your body, or lose movement in your arms or legs.   You have difficulty with speech.   You become sweaty and/or feel light headed.  Make sure you are re-checked as instructed.  MAKE SURE YOU:    Understand these instructions.   Will watch your condition.   Will get help right away if you are not doing well or get worse.  Document Released: 12/12/2005 Document Revised: 03/05/2012 Document Reviewed: 08/01/2008  [employer] Patient Information 2014 [employer].

## 2013-08-01 ENCOUNTER — Encounter: Payer: Self-pay | Admitting: Internal Medicine

## 2013-08-01 ENCOUNTER — Encounter (HOSPITAL_BASED_OUTPATIENT_CLINIC_OR_DEPARTMENT_OTHER): Payer: Medicare PPO | Attending: Internal Medicine

## 2013-08-01 DIAGNOSIS — L8992 Pressure ulcer of unspecified site, stage 2: Secondary | ICD-10-CM | POA: Insufficient documentation

## 2013-08-01 DIAGNOSIS — L89609 Pressure ulcer of unspecified heel, unspecified stage: Secondary | ICD-10-CM | POA: Insufficient documentation

## 2013-08-01 DIAGNOSIS — L84 Corns and callosities: Secondary | ICD-10-CM | POA: Insufficient documentation

## 2013-08-01 NOTE — Assessment & Plan Note (Addendum)
Her EKG shows low voltage and possible pulmonary pattern, I do not see any signs of tamponade I have ordered an ECHO to check her EF  Will check her labs today to see if there is concern for CHF or other causes of hypotension and edema

## 2013-08-01 NOTE — Assessment & Plan Note (Signed)
Low voltage noted ECHO ordered

## 2013-08-01 NOTE — Assessment & Plan Note (Signed)
I will check her labs to look for secondary causes She will elevated her LE's ECHO ordered to check EF, look for hypokinesis/effusion, check valve function

## 2013-08-07 ENCOUNTER — Ambulatory Visit (HOSPITAL_COMMUNITY): Payer: Medicare PPO | Attending: Cardiovascular Disease | Admitting: Radiology

## 2013-08-07 DIAGNOSIS — R609 Edema, unspecified: Secondary | ICD-10-CM

## 2013-08-07 DIAGNOSIS — I10 Essential (primary) hypertension: Secondary | ICD-10-CM | POA: Insufficient documentation

## 2013-08-07 DIAGNOSIS — R9431 Abnormal electrocardiogram [ECG] [EKG]: Secondary | ICD-10-CM | POA: Insufficient documentation

## 2013-08-07 DIAGNOSIS — I959 Hypotension, unspecified: Secondary | ICD-10-CM

## 2013-08-07 DIAGNOSIS — I9589 Other hypotension: Secondary | ICD-10-CM

## 2013-08-07 NOTE — Progress Notes (Signed)
Echocardiogram performed.  

## 2013-08-15 ENCOUNTER — Telehealth (INDEPENDENT_AMBULATORY_CARE_PROVIDER_SITE_OTHER): Payer: Self-pay

## 2013-08-15 NOTE — Telephone Encounter (Signed)
LMOV pt to call Cyndra Numbers regarding her appt with Dr. Abbey Chatters on 08/16/13.

## 2013-08-16 ENCOUNTER — Ambulatory Visit (INDEPENDENT_AMBULATORY_CARE_PROVIDER_SITE_OTHER): Payer: Medicare HMO | Admitting: General Surgery

## 2013-08-16 ENCOUNTER — Encounter (INDEPENDENT_AMBULATORY_CARE_PROVIDER_SITE_OTHER): Payer: Self-pay | Admitting: General Surgery

## 2013-08-16 VITALS — BP 98/52 | HR 68 | Temp 97.9°F | Resp 15 | Ht 65.5 in | Wt 122.0 lb

## 2013-08-16 DIAGNOSIS — K644 Residual hemorrhoidal skin tags: Secondary | ICD-10-CM | POA: Insufficient documentation

## 2013-08-16 NOTE — Patient Instructions (Signed)
Keeping using the cream.

## 2013-08-16 NOTE — Progress Notes (Addendum)
Patient ID: Sherri Weber, female   DOB: Aug 31, 1933, 79 y.o.   MRN: [id number]  Chief Complaint  Patient presents with  . Follow-up    LTFU / rectal issues    HPI Sherri Weber is a 79 y.o. female.   HPI  She is an established patient. Sh comes in presenting with pain in the anal area. She says this pain feels a little worse. She had a left hip fracture in May of this year and has then sitting and lying more than usual.  Her colostomy is working well.  She has been putting some cream on the area-she cannot remember the name of it.   Past Medical History  Diagnosis Date  . Depressive disorder, not elsewhere classified   . Diarrhea   . Other malaise and fatigue   . Abdominal distension   . Hemorrhoids   . Chronic constipation   . Family history of malignant neoplasm of gastrointestinal tract   . Rectal prolapse   . Ulcer of anus and rectum   . Irritable bowel syndrome   . Low back pain   . Osteoarthritis     Past Surgical History  Procedure Laterality Date  . Abdominal hysterectomy    . Cholecystectomy    . Appendectomy    . Hemicolectomy    . Cecectomy    . Colon surgery    . Colostomy    . Hip arthroplasty Left 04/29/2013    Procedure: ARTHROPLASTY BIPOLAR HIP;  Surgeon: Shelda Pal, MD;  Location: [facility];  Service: Orthopedics;  Laterality: Left;    Family History  Problem Relation Age of Onset  . Diabetes Sister   . Stroke Sister   . Mental retardation Maternal Uncle   . Emphysema Mother   . Stroke Father   . Cancer Sister     Colon    Social History History  Substance Use Topics  . Smoking status: Never Smoker   . Smokeless tobacco: Never Used  . Alcohol Use: No     Comment: Social.    No Known Allergies  Current Outpatient Prescriptions  Medication Sig Dispense Refill  . alprazolam (XANAX) 2 MG tablet Take 1 tablet (2 mg total) by mouth 3 (three) times daily as needed for anxiety.  90 tablet  3  . amoxicillin (AMOXIL) 500 MG capsule Take 1  capsule (500 mg total) by mouth 3 (three) times daily.  30 capsule  0  . cyclobenzaprine (FLEXERIL) 10 MG tablet Take 1 tablet (10 mg total) by mouth 2 (two) times daily as needed for muscle spasms.  60 tablet  11  . dexlansoprazole (DEXILANT) 60 MG capsule Take 60 mg by mouth every evening.       Marland Kitchen HYDROcodone-acetaminophen (NORCO/VICODIN) 5-325 MG per tablet Take 1 tablet by mouth every 8 (eight) hours as needed for pain.  90 tablet  3  . lactose free nutrition (BOOST) LIQD Take 237 mLs by mouth 3 (three) times daily between meals.      Marland Kitchen oxybutynin (DITROPAN-XL) 10 MG 24 hr tablet Take 1 tablet (10 mg total) by mouth every evening.  90 tablet  3  . polyethylene glycol (MIRALAX / GLYCOLAX) packet Take 17 g by mouth daily as needed (for constipation).      . promethazine (PHENERGAN) 25 MG tablet Take 25 mg by mouth every 6 (six) hours as needed for nausea.      . sertraline (ZOLOFT) 50 MG tablet Take 2 tablets (100 mg total) by  mouth daily.  180 tablet  2  . traZODone (DESYREL) 50 MG tablet Take 0.5-1 tablets (25-50 mg total) by mouth at bedtime.  60 tablet  5  . zolpidem (AMBIEN) 10 MG tablet Take 1 tablet (10 mg total) by mouth at bedtime as needed for sleep.  30 tablet  5   No current facility-administered medications for this visit.    Review of Systems Review of Systems  Gastrointestinal: Positive for rectal pain.  Musculoskeletal:       Left heel pressure ulcer.    Blood pressure 98/52, pulse 68, temperature 97.9 F (36.6 C), temperature source Temporal, resp. rate 15, height 5' 5.5" (1.664 m), weight 122 lb (55.339 kg).  Physical Exam Physical Exam  Constitutional: She appears well-developed and well-nourished.  Abdominal: Soft. There is no tenderness.  Left-sided colostomy is pink.  Genitourinary:  Right posterior external hemorrhoid. On the perineal side is a area of excoriation that is mildly tender.    Data Reviewed Previous note.  Assessment    Painful external  hemorrhoid with area of excoriation.     Plan    She is out of the cream that has been helping her. I have asked her to go home and call back with its name so we can refill it. Return visit one month.        Anamarie Hunn J 08/16/2013, 10:24 AM

## 2013-08-21 ENCOUNTER — Telehealth (INDEPENDENT_AMBULATORY_CARE_PROVIDER_SITE_OTHER): Payer: Self-pay

## 2013-08-21 NOTE — Telephone Encounter (Signed)
Pt calling to ask if Dr Abbey Chatters would remove a small hemorrhoid on her when she comes in to the office on 09/16/13 before she leaves to go back to [place] with her daughter. Pls advise.

## 2013-08-29 ENCOUNTER — Encounter (HOSPITAL_BASED_OUTPATIENT_CLINIC_OR_DEPARTMENT_OTHER): Payer: Medicare PPO | Attending: Internal Medicine

## 2013-08-29 DIAGNOSIS — L8992 Pressure ulcer of unspecified site, stage 2: Secondary | ICD-10-CM | POA: Insufficient documentation

## 2013-08-29 DIAGNOSIS — L89609 Pressure ulcer of unspecified heel, unspecified stage: Secondary | ICD-10-CM | POA: Insufficient documentation

## 2013-08-30 ENCOUNTER — Other Ambulatory Visit (INDEPENDENT_AMBULATORY_CARE_PROVIDER_SITE_OTHER): Payer: Medicare PPO

## 2013-08-30 ENCOUNTER — Ambulatory Visit (INDEPENDENT_AMBULATORY_CARE_PROVIDER_SITE_OTHER): Payer: Medicare PPO | Admitting: Internal Medicine

## 2013-08-30 ENCOUNTER — Encounter: Payer: Self-pay | Admitting: Internal Medicine

## 2013-08-30 VITALS — BP 110/62 | HR 77 | Temp 97.8°F | Resp 16 | Wt 123.0 lb

## 2013-08-30 DIAGNOSIS — F3289 Other specified depressive episodes: Secondary | ICD-10-CM

## 2013-08-30 DIAGNOSIS — F329 Major depressive disorder, single episode, unspecified: Secondary | ICD-10-CM

## 2013-08-30 DIAGNOSIS — M545 Low back pain, unspecified: Secondary | ICD-10-CM

## 2013-08-30 DIAGNOSIS — E785 Hyperlipidemia, unspecified: Secondary | ICD-10-CM

## 2013-08-30 DIAGNOSIS — Z23 Encounter for immunization: Secondary | ICD-10-CM

## 2013-08-30 DIAGNOSIS — M1990 Unspecified osteoarthritis, unspecified site: Secondary | ICD-10-CM

## 2013-08-30 DIAGNOSIS — K219 Gastro-esophageal reflux disease without esophagitis: Secondary | ICD-10-CM

## 2013-08-30 LAB — LIPID PANEL
Cholesterol: 150 mg/dL (ref 0–200)
HDL: 28.2 mg/dL — ABNORMAL LOW (ref >39.00)
Total CHOL/HDL Ratio: 5
Triglycerides: 378 mg/dL — ABNORMAL HIGH (ref 0.0–149.0)
VLDL: 75.6 mg/dL — ABNORMAL HIGH (ref 0.0–40.0)

## 2013-08-30 LAB — LDL CHOLESTEROL, DIRECT: Direct LDL: 60.2 mg/dL

## 2013-08-30 MED ORDER — BUPROPION HCL ER (XL) 150 MG PO TB24: 150.0000 mg | ORAL_TABLET | Freq: Every day | ORAL | Status: DC

## 2013-08-30 MED ORDER — PROMETHAZINE HCL 25 MG PO TABS: 25.0000 mg | ORAL_TABLET | Freq: Four times a day (QID) | ORAL | Status: DC | PRN

## 2013-08-30 MED ORDER — OMEPRAZOLE 40 MG PO CPDR
40.0000 mg | DELAYED_RELEASE_CAPSULE | Freq: Every day | ORAL | Status: DC
Start: 2013-08-30 — End: 2014-05-29

## 2013-08-30 NOTE — Progress Notes (Signed)
Subjective:    Patient ID: Sherri Weber, female    DOB: 1933-02-14, 79 y.o.   MRN: [id number]  Hyperlipidemia This is a chronic problem. The problem is controlled. Recent lipid tests were reviewed and are variable. Exacerbating diseases include obesity. She has no history of chronic renal disease, diabetes, hypothyroidism or liver disease. There are no known factors aggravating her hyperlipidemia. Pertinent negatives include no chest pain, focal sensory loss, focal weakness, leg pain, myalgias or shortness of breath. Current antihyperlipidemic treatment includes diet change. The current treatment provides mild improvement of lipids. Compliance problems include adherence to diet and adherence to exercise.       Review of Systems  Constitutional: Positive for fatigue. Negative for fever, chills, diaphoresis, appetite change and unexpected weight change.  HENT: Negative.   Eyes: Negative.   Respiratory: Negative.  Negative for cough, chest tightness, shortness of breath, wheezing and stridor.   Cardiovascular: Negative.  Negative for chest pain, palpitations and leg swelling.  Gastrointestinal: Negative.  Negative for nausea, vomiting, abdominal pain, diarrhea, constipation and blood in stool.  Endocrine: Negative.   Genitourinary: Negative.   Musculoskeletal: Positive for back pain and arthralgias. Negative for myalgias, joint swelling and gait problem.  Skin: Negative.   Allergic/Immunologic: Negative.   Neurological: Negative.  Negative for dizziness, tremors, focal weakness, weakness, light-headedness and numbness.  Hematological: Negative.  Negative for adenopathy. Does not bruise/bleed easily.  Psychiatric/Behavioral: Positive for dysphoric mood. Negative for suicidal ideas, hallucinations, behavioral problems, confusion, sleep disturbance, self-injury, decreased concentration and agitation. The patient is not nervous/anxious and is not hyperactive.        Objective:   Physical  Exam  Vitals reviewed. Constitutional: She is oriented to person, place, and time. She appears well-developed and well-nourished. No distress.  HENT:  Head: Normocephalic and atraumatic.  Mouth/Throat: Oropharynx is clear and moist. No oropharyngeal exudate.  Eyes: Conjunctivae are normal. Right eye exhibits no discharge. Left eye exhibits no discharge. No scleral icterus.  Neck: Normal range of motion. Neck supple. No JVD present. No tracheal deviation present. No thyromegaly present.  Cardiovascular: Normal rate, regular rhythm, normal heart sounds and intact distal pulses.  Exam reveals no gallop and no friction rub.   No murmur heard. Pulmonary/Chest: Effort normal and breath sounds normal. No stridor. No respiratory distress. She has no wheezes. She has no rales. She exhibits no tenderness.  Abdominal: Soft. Bowel sounds are normal. She exhibits no distension and no mass. There is no tenderness. There is no rebound and no guarding.  Musculoskeletal: Normal range of motion. She exhibits no edema and no tenderness.  Lymphadenopathy:    She has no cervical adenopathy.  Neurological: She is oriented to person, place, and time.  Skin: Skin is warm and dry. No rash noted. She is not diaphoretic. No erythema. No pallor.  Psychiatric: She has a normal mood and affect. Her behavior is normal. Judgment and thought content normal.     Lab Results  Component Value Date   WBC 5.8 07/30/2013   HGB 12.5 07/30/2013   HCT 37.2 07/30/2013   PLT 262.0 07/30/2013   GLUCOSE 86 07/30/2013   CHOL 175 04/23/2012   TRIG 183.0* 04/23/2012   HDL 34.50* 04/23/2012   LDLDIRECT 65.0 02/28/2012   LDLCALC 104* 04/23/2012   ALT 10 07/30/2013   AST 17 07/30/2013   NA 140 07/30/2013   K 4.6 07/30/2013   CL 107 07/30/2013   CREATININE 0.9 07/30/2013   BUN 13 07/30/2013   CO2 28  07/30/2013   TSH 4.53 07/30/2013   INR 0.95 04/28/2013       Assessment & Plan:

## 2013-08-30 NOTE — Patient Instructions (Signed)

## 2013-09-01 NOTE — Assessment & Plan Note (Signed)
She will continue the current meds for pain 

## 2013-09-01 NOTE — Assessment & Plan Note (Signed)
Trigs are up - she does not need a med but she will work on her diet Her LDL is excellent

## 2013-09-01 NOTE — Assessment & Plan Note (Signed)
She will add wellbutrin to see if that will help

## 2013-09-01 NOTE — Assessment & Plan Note (Signed)
Will continue the current meds for pain 

## 2013-09-03 ENCOUNTER — Encounter (INDEPENDENT_AMBULATORY_CARE_PROVIDER_SITE_OTHER): Payer: Medicare HMO | Admitting: General Surgery

## 2013-09-13 ENCOUNTER — Ambulatory Visit (INDEPENDENT_AMBULATORY_CARE_PROVIDER_SITE_OTHER): Payer: Medicare HMO | Admitting: General Surgery

## 2013-09-13 ENCOUNTER — Encounter (INDEPENDENT_AMBULATORY_CARE_PROVIDER_SITE_OTHER): Payer: Self-pay | Admitting: General Surgery

## 2013-09-13 VITALS — BP 108/64 | HR 92 | Resp 16 | Ht 65.5 in | Wt 124.0 lb

## 2013-09-13 DIAGNOSIS — K644 Residual hemorrhoidal skin tags: Secondary | ICD-10-CM

## 2013-09-13 MED ORDER — HYDROCORTISONE 2.5 % RE CREA: TOPICAL_CREAM | Freq: Two times a day (BID) | RECTAL | Status: DC

## 2013-09-13 NOTE — Patient Instructions (Addendum)
Applied  witch hazel to the area to 3 times a day.

## 2013-09-13 NOTE — Progress Notes (Signed)
Subjective:     Patient ID: Sherri Weber, female   DOB: [birth year], 79 y.o.   MRN: [id number]  HPI  She returns for followup visit. She still having the same pain. She is wondering if she can have some stronger pain medication.   Review of Systems     Objective:   Physical Exam Anorectal-she has some external hemorrhoids that are nontender. The area of excoriation anteriorly is significantly better. On digital rectal exam there no masses felt.  Anoscopy demonstrates a small to moderate sized internal hemorrhoid in the right posterior position but no fissures or masses.    Assessment:     Chronic pelvic pain-I do not think her pain is secondary to her external hemorrhoids and thus I do not think that a external hemorrhoidectomy is going to be helpful.    Plan:     Anusol HC 2.5% cream. Apply witch hazel to the swollen external hemorrhoids. I'm not sure this will help with her pain but certainly worth a try. She may need to see a chronic pain specialist but I will leave  this up to Dr. Yetta Barre. Return visit when necessary.

## 2013-09-16 ENCOUNTER — Encounter (INDEPENDENT_AMBULATORY_CARE_PROVIDER_SITE_OTHER): Payer: Medicare HMO | Admitting: General Surgery

## 2013-09-16 ENCOUNTER — Ambulatory Visit: Payer: Medicare PPO | Admitting: Internal Medicine

## 2013-09-16 DIAGNOSIS — Z0289 Encounter for other administrative examinations: Secondary | ICD-10-CM

## 2013-09-18 ENCOUNTER — Encounter: Payer: Self-pay | Admitting: Internal Medicine

## 2013-09-18 ENCOUNTER — Ambulatory Visit (INDEPENDENT_AMBULATORY_CARE_PROVIDER_SITE_OTHER): Payer: Medicare PPO | Admitting: Internal Medicine

## 2013-09-18 VITALS — BP 98/60 | HR 94 | Temp 97.6°F | Resp 16

## 2013-09-18 DIAGNOSIS — M545 Low back pain, unspecified: Secondary | ICD-10-CM

## 2013-09-18 MED ORDER — HYDROCODONE-ACETAMINOPHEN 7.5-300 MG PO TABS: 1.0000 | ORAL_TABLET | Freq: Three times a day (TID) | ORAL | Status: DC | PRN

## 2013-09-18 NOTE — Progress Notes (Signed)
Subjective:    Patient ID: Sherri Weber, female    DOB: 01-03-1933, 79 y.o.   MRN: [id number]  Back Pain This is a chronic problem. The current episode started more than 1 year ago. The problem occurs intermittently. The problem is unchanged. The pain is present in the lumbar spine. The quality of the pain is described as aching. The pain does not radiate. The pain is at a severity of 3/10. The pain is mild. The pain is worse during the day. The symptoms are aggravated by bending, position and standing. Pertinent negatives include no abdominal pain, bladder incontinence, bowel incontinence, chest pain, dysuria, fever, headaches, leg pain, numbness, paresis, paresthesias, pelvic pain, perianal numbness, tingling, weakness or weight loss. Risk factors include poor posture, lack of exercise and obesity. She has tried analgesics for the symptoms. The treatment provided moderate relief.      Review of Systems  Constitutional: Positive for fatigue. Negative for fever, chills, weight loss, diaphoresis, activity change, appetite change and unexpected weight change.  HENT: Negative.   Eyes: Negative.   Respiratory: Negative.  Negative for cough, chest tightness, shortness of breath, wheezing and stridor.   Cardiovascular: Negative.  Negative for chest pain, palpitations and leg swelling.  Gastrointestinal: Negative.  Negative for nausea, abdominal pain, diarrhea, constipation, blood in stool and bowel incontinence.  Endocrine: Negative.   Genitourinary: Negative.  Negative for bladder incontinence, dysuria and pelvic pain.  Musculoskeletal: Positive for back pain. Negative for myalgias, joint swelling, arthralgias and gait problem.  Skin: Negative.   Allergic/Immunologic: Negative.   Neurological: Negative.  Negative for dizziness, tingling, tremors, seizures, weakness, light-headedness, numbness, headaches and paresthesias.  Hematological: Negative.  Negative for adenopathy. Does not bruise/bleed  easily.  Psychiatric/Behavioral: Negative.        Objective:   Physical Exam  Vitals reviewed. Constitutional: She is oriented to person, place, and time. She appears well-developed and well-nourished. No distress.  HENT:  Head: Normocephalic and atraumatic.  Mouth/Throat: Oropharynx is clear and moist. No oropharyngeal exudate.  Eyes: Conjunctivae are normal. Right eye exhibits no discharge. Left eye exhibits no discharge. No scleral icterus.  Neck: Normal range of motion. Neck supple. No JVD present. No tracheal deviation present. No thyromegaly present.  Cardiovascular: Normal rate, regular rhythm, normal heart sounds and intact distal pulses.  Exam reveals no gallop and no friction rub.   No murmur heard. Pulmonary/Chest: Effort normal and breath sounds normal. No stridor. No respiratory distress. She has no wheezes. She has no rales. She exhibits no tenderness.  Abdominal: Soft. Bowel sounds are normal. She exhibits no distension and no mass. There is no tenderness. There is no rebound and no guarding.  Musculoskeletal: Normal range of motion. She exhibits no edema and no tenderness.  Lymphadenopathy:    She has no cervical adenopathy.  Neurological: She is alert and oriented to person, place, and time. She has normal strength. She displays no atrophy, no tremor and normal reflexes. No cranial nerve deficit or sensory deficit. She exhibits normal muscle tone. She displays a negative Romberg sign. She displays no seizure activity. Coordination and gait normal.  Reflex Scores:      Tricep reflexes are 1+ on the right side and 1+ on the left side.      Bicep reflexes are 1+ on the right side and 1+ on the left side.      Brachioradialis reflexes are 1+ on the right side and 1+ on the left side.      Patellar reflexes  are 1+ on the right side and 1+ on the left side.      Achilles reflexes are 1+ on the right side and 1+ on the left side. Neg SLR in BLE  Skin: Skin is warm and dry. No  rash noted. She is not diaphoretic. No erythema. No pallor.     Lab Results  Component Value Date   WBC 5.8 07/30/2013   HGB 12.5 07/30/2013   HCT 37.2 07/30/2013   PLT 262.0 07/30/2013   GLUCOSE 86 07/30/2013   CHOL 150 08/30/2013   TRIG 378.0* 08/30/2013   HDL 28.20* 08/30/2013   LDLDIRECT 60.2 08/30/2013   LDLCALC 104* 04/23/2012   ALT 10 07/30/2013   AST 17 07/30/2013   NA 140 07/30/2013   K 4.6 07/30/2013   CL 107 07/30/2013   CREATININE 0.9 07/30/2013   BUN 13 07/30/2013   CO2 28 07/30/2013   TSH 4.53 07/30/2013   INR 0.95 04/28/2013       Assessment & Plan:

## 2013-09-18 NOTE — Assessment & Plan Note (Signed)
I have increased the dose of hydrocodone at her request

## 2013-09-18 NOTE — Patient Instructions (Signed)
Back Pain, Adult  Low back pain is very common. About 1 in 5 people have back pain. The cause of low back pain is rarely dangerous. The pain often gets better over time. About half of people with a sudden onset of back pain feel better in just 2 weeks. About 8 in 10 people feel better by 6 weeks.   CAUSES  Some common causes of back pain include:  · Strain of the muscles or ligaments supporting the spine.  · Wear and tear (degeneration) of the spinal discs.  · Arthritis.  · Direct injury to the back.  DIAGNOSIS  Most of the time, the direct cause of low back pain is not known. However, back pain can be treated effectively even when the exact cause of the pain is unknown. Answering your caregiver's questions about your overall health and symptoms is one of the most accurate ways to make sure the cause of your pain is not dangerous. If your caregiver needs more information, he or she may order lab work or imaging tests (X-rays or MRIs). However, even if imaging tests show changes in your back, this usually does not require surgery.  HOME CARE INSTRUCTIONS  For many people, back pain returns. Since low back pain is rarely dangerous, it is often a condition that people can learn to manage on their own.   · Remain active. It is stressful on the back to sit or stand in one place. Do not sit, drive, or stand in one place for more than 30 minutes at a time. Take short walks on level surfaces as soon as pain allows. Try to increase the length of time you walk each day.  · Do not stay in bed. Resting more than 1 or 2 days can delay your recovery.  · Do not avoid exercise or work. Your body is made to move. It is not dangerous to be active, even though your back may hurt. Your back will likely heal faster if you return to being active before your pain is gone.  · Pay attention to your body when you  bend and lift. Many people have less discomfort when lifting if they bend their knees, keep the load close to their bodies, and  avoid twisting. Often, the most comfortable positions are those that put less stress on your recovering back.  · Find a comfortable position to sleep. Use a firm mattress and lie on your side with your knees slightly bent. If you lie on your back, put a pillow under your knees.  · Only take over-the-counter or prescription medicines as directed by your caregiver. Over-the-counter medicines to reduce pain and inflammation are often the most helpful. Your caregiver may prescribe muscle relaxant drugs. These medicines help dull your pain so you can more quickly return to your normal activities and healthy exercise.  · Put ice on the injured area.  · Put ice in a plastic bag.  · Place a towel between your skin and the bag.  · Leave the ice on for 15-20 minutes, 3-4 times a day for the first 2 to 3 days. After that, ice and heat may be alternated to reduce pain and spasms.  · Ask your caregiver about trying back exercises and gentle massage. This may be of some benefit.  · Avoid feeling anxious or stressed. Stress increases muscle tension and can worsen back pain. It is important to recognize when you are anxious or stressed and learn ways to manage it. Exercise is a great option.  SEEK MEDICAL CARE IF:  · You have pain that is not relieved with rest or   medicine.  · You have pain that does not improve in 1 week.  · You have new symptoms.  · You are generally not feeling well.  SEEK IMMEDIATE MEDICAL CARE IF:   · You have pain that radiates from your back into your legs.  · You develop new bowel or bladder control problems.  · You have unusual weakness or numbness in your arms or legs.  · You develop nausea or vomiting.  · You develop abdominal pain.  · You feel faint.  Document Released: 12/12/2005 Document Revised: 06/12/2012 Document Reviewed: 05/02/2011  Exit[employer]® Patient Information ©2014 [employer].

## 2013-09-26 ENCOUNTER — Encounter (HOSPITAL_BASED_OUTPATIENT_CLINIC_OR_DEPARTMENT_OTHER): Payer: Medicare HMO | Attending: Internal Medicine

## 2013-09-26 DIAGNOSIS — L8992 Pressure ulcer of unspecified site, stage 2: Secondary | ICD-10-CM | POA: Insufficient documentation

## 2013-09-26 DIAGNOSIS — L89609 Pressure ulcer of unspecified heel, unspecified stage: Secondary | ICD-10-CM | POA: Insufficient documentation

## 2013-09-26 DIAGNOSIS — L84 Corns and callosities: Secondary | ICD-10-CM | POA: Insufficient documentation

## 2013-09-30 ENCOUNTER — Encounter (INDEPENDENT_AMBULATORY_CARE_PROVIDER_SITE_OTHER): Payer: Medicare HMO | Admitting: General Surgery

## 2013-10-21 ENCOUNTER — Ambulatory Visit (INDEPENDENT_AMBULATORY_CARE_PROVIDER_SITE_OTHER): Payer: Medicare PPO | Admitting: Internal Medicine

## 2013-10-21 ENCOUNTER — Encounter: Payer: Self-pay | Admitting: Internal Medicine

## 2013-10-21 VITALS — BP 110/62 | HR 83 | Temp 98.1°F | Resp 16 | Wt 129.0 lb

## 2013-10-21 DIAGNOSIS — M1990 Unspecified osteoarthritis, unspecified site: Secondary | ICD-10-CM

## 2013-10-21 DIAGNOSIS — M545 Low back pain, unspecified: Secondary | ICD-10-CM

## 2013-10-21 MED ORDER — HYDROCODONE-ACETAMINOPHEN 7.5-300 MG PO TABS: 1.0000 | ORAL_TABLET | Freq: Three times a day (TID) | ORAL | Status: DC | PRN

## 2013-10-21 NOTE — Patient Instructions (Signed)

## 2013-10-21 NOTE — Progress Notes (Signed)
Subjective:    Patient ID: Sherri Weber, female    DOB: 01/18/1933, 79 y.o.   MRN: [id number]  Arthritis Presents for follow-up visit. The disease course has been fluctuating. She complains of pain. She reports no stiffness, joint swelling or joint warmth. Affected locations include the right ankle, left ankle, right knee and left knee. Her pain is at a severity of 3/10. Associated symptoms include pain at night and pain while resting. Pertinent negatives include no diarrhea, dry eyes, dry mouth, dysuria, fatigue, fever, rash, Raynaud's syndrome, uveitis or weight loss. Her past medical history is significant for osteoarthritis. Her pertinent risk factors include overuse.      Review of Systems  Constitutional: Negative.  Negative for fever, chills, weight loss, diaphoresis, appetite change and fatigue.  HENT: Negative.   Eyes: Negative.   Respiratory: Negative.  Negative for apnea, cough, choking, chest tightness, shortness of breath, wheezing and stridor.   Cardiovascular: Negative.  Negative for chest pain, palpitations and leg swelling.  Gastrointestinal: Negative.  Negative for nausea, vomiting, abdominal pain, diarrhea, constipation and blood in stool.  Endocrine: Negative.   Genitourinary: Negative.  Negative for dysuria.  Musculoskeletal: Positive for arthralgias, arthritis and back pain. Negative for gait problem, joint swelling, myalgias, neck pain, neck stiffness and stiffness.  Skin: Negative.  Negative for color change, pallor, rash and wound.  Allergic/Immunologic: Negative.   Neurological: Negative.  Negative for dizziness, tremors, speech difficulty, light-headedness and numbness.  Hematological: Negative.  Negative for adenopathy. Does not bruise/bleed easily.  Psychiatric/Behavioral: Negative.        Objective:   Physical Exam  Vitals reviewed. Constitutional: She is oriented to person, place, and time. She appears well-developed and well-nourished. No distress.   HENT:  Head: Normocephalic and atraumatic.  Mouth/Throat: Oropharynx is clear and moist. No oropharyngeal exudate.  Eyes: Conjunctivae are normal. Right eye exhibits no discharge. Left eye exhibits no discharge. No scleral icterus.  Neck: Normal range of motion. Neck supple. No JVD present. No tracheal deviation present. No thyromegaly present.  Cardiovascular: Normal rate, regular rhythm, normal heart sounds and intact distal pulses.  Exam reveals no gallop and no friction rub.   No murmur heard. Pulmonary/Chest: Effort normal and breath sounds normal. No stridor. No respiratory distress. She has no wheezes. She has no rales. She exhibits no tenderness.  Abdominal: Soft. Bowel sounds are normal. She exhibits no distension and no mass. There is no tenderness. There is no rebound and no guarding.  Musculoskeletal: Normal range of motion. She exhibits edema (1+ pitting edema over both ankles). She exhibits no tenderness.       Right foot: Normal.       Left foot: Normal.  The ulcers on both heels have healed  Lymphadenopathy:    She has no cervical adenopathy.  Neurological: She is oriented to person, place, and time.  Skin: Skin is warm and dry. No rash noted. She is not diaphoretic. No erythema. No pallor.  Psychiatric: She has a normal mood and affect. Her behavior is normal. Judgment and thought content normal.      Lab Results  Component Value Date   WBC 5.8 07/30/2013   HGB 12.5 07/30/2013   HCT 37.2 07/30/2013   PLT 262.0 07/30/2013   GLUCOSE 86 07/30/2013   CHOL 150 08/30/2013   TRIG 378.0* 08/30/2013   HDL 28.20* 08/30/2013   LDLDIRECT 60.2 08/30/2013   LDLCALC 104* 04/23/2012   ALT 10 07/30/2013   AST 17 07/30/2013   NA 140  07/30/2013   K 4.6 07/30/2013   CL 107 07/30/2013   CREATININE 0.9 07/30/2013   BUN 13 07/30/2013   CO2 28 07/30/2013   TSH 4.53 07/30/2013   INR 0.95 04/28/2013      Assessment & Plan:

## 2013-10-22 ENCOUNTER — Encounter: Payer: Self-pay | Admitting: Internal Medicine

## 2013-10-22 NOTE — Assessment & Plan Note (Signed)
No change noted in her complaint or exam She will cont the current meds for pain

## 2013-10-22 NOTE — Assessment & Plan Note (Signed)
She will cont the current meds for pain 

## 2013-11-07 ENCOUNTER — Ambulatory Visit (INDEPENDENT_AMBULATORY_CARE_PROVIDER_SITE_OTHER): Payer: Medicare PPO | Admitting: Internal Medicine

## 2013-11-07 ENCOUNTER — Encounter: Payer: Self-pay | Admitting: Internal Medicine

## 2013-11-07 VITALS — BP 130/84 | HR 80 | Temp 97.2°F | Resp 16 | Wt 134.0 lb

## 2013-11-07 DIAGNOSIS — M1990 Unspecified osteoarthritis, unspecified site: Secondary | ICD-10-CM

## 2013-11-07 NOTE — Progress Notes (Signed)
  Subjective:    Patient ID: Sherri Weber, female    DOB: 06-25-1933, 79 y.o.   MRN: [id number]  HPI  She returns for f/up and again she asks that I prescribe a diet pill - I have already denied this request by her several times.  Review of Systems  Constitutional: Negative.   HENT: Negative.   Eyes: Negative.   Respiratory: Negative.  Negative for cough, chest tightness, shortness of breath and stridor.   Cardiovascular: Negative.  Negative for chest pain, palpitations and leg swelling.  Gastrointestinal: Negative.   Endocrine: Negative.   Genitourinary: Negative.   Musculoskeletal: Positive for arthralgias. Negative for back pain, gait problem, joint swelling, myalgias, neck pain and neck stiffness.  Skin: Negative.   Allergic/Immunologic: Negative.   Neurological: Negative.   Hematological: Negative.  Negative for adenopathy. Does not bruise/bleed easily.  Psychiatric/Behavioral: Positive for sleep disturbance. Negative for suicidal ideas, hallucinations, behavioral problems, confusion, self-injury, dysphoric mood, decreased concentration and agitation. The patient is nervous/anxious. The patient is not hyperactive.        Objective:   Physical Exam  Vitals reviewed. Constitutional: She is oriented to person, place, and time. She appears well-developed and well-nourished. No distress.  HENT:  Head: Normocephalic and atraumatic.  Mouth/Throat: Oropharynx is clear and moist. No oropharyngeal exudate.  Eyes: Conjunctivae are normal. Right eye exhibits no discharge. Left eye exhibits no discharge. No scleral icterus.  Neck: Normal range of motion. Neck supple. No JVD present. No tracheal deviation present. No thyromegaly present.  Cardiovascular: Normal rate, regular rhythm, normal heart sounds and intact distal pulses.  Exam reveals no gallop and no friction rub.   No murmur heard. Pulmonary/Chest: Effort normal and breath sounds normal. No stridor. No respiratory distress.  She has no wheezes. She has no rales. She exhibits no tenderness.  Abdominal: Soft. Bowel sounds are normal. She exhibits no distension and no mass. There is no tenderness. There is no rebound and no guarding.  Musculoskeletal: Normal range of motion. She exhibits edema (1+ pitting edema over both ankles). She exhibits no tenderness.  Lymphadenopathy:    She has no cervical adenopathy.  Neurological: She is oriented to person, place, and time.  Skin: Skin is warm and dry. No rash noted. She is not diaphoretic. No erythema. No pallor.  Psychiatric: She has a normal mood and affect. Her behavior is normal. Judgment and thought content normal.     Lab Results  Component Value Date   WBC 5.8 07/30/2013   HGB 12.5 07/30/2013   HCT 37.2 07/30/2013   PLT 262.0 07/30/2013   GLUCOSE 86 07/30/2013   CHOL 150 08/30/2013   TRIG 378.0* 08/30/2013   HDL 28.20* 08/30/2013   LDLDIRECT 60.2 08/30/2013   LDLCALC 104* 04/23/2012   ALT 10 07/30/2013   AST 17 07/30/2013   NA 140 07/30/2013   K 4.6 07/30/2013   CL 107 07/30/2013   CREATININE 0.9 07/30/2013   BUN 13 07/30/2013   CO2 28 07/30/2013   TSH 4.53 07/30/2013   INR 0.95 04/28/2013       Assessment & Plan:

## 2013-11-07 NOTE — Patient Instructions (Signed)

## 2013-11-07 NOTE — Assessment & Plan Note (Signed)
She will continue the current meds for pain 

## 2013-11-07 NOTE — Progress Notes (Signed)
Pre visit review using our clinic review tool, if applicable. No additional management support is needed unless otherwise documented below in the visit note. 

## 2013-11-29 ENCOUNTER — Ambulatory Visit: Payer: Medicare HMO | Admitting: Internal Medicine

## 2013-12-02 ENCOUNTER — Encounter: Payer: Self-pay | Admitting: Internal Medicine

## 2013-12-02 ENCOUNTER — Ambulatory Visit (INDEPENDENT_AMBULATORY_CARE_PROVIDER_SITE_OTHER): Payer: Medicare HMO | Admitting: Internal Medicine

## 2013-12-02 VITALS — BP 138/84 | HR 84 | Temp 98.4°F | Resp 16 | Ht 65.5 in | Wt 130.0 lb

## 2013-12-02 DIAGNOSIS — M1990 Unspecified osteoarthritis, unspecified site: Secondary | ICD-10-CM

## 2013-12-02 DIAGNOSIS — M545 Low back pain, unspecified: Secondary | ICD-10-CM

## 2013-12-02 MED ORDER — HYDROCODONE-ACETAMINOPHEN 7.5-300 MG PO TABS: 1.0000 | ORAL_TABLET | Freq: Three times a day (TID) | ORAL | Status: DC | PRN

## 2013-12-02 NOTE — Assessment & Plan Note (Signed)
She will continue hydrocodone as needed for pain

## 2013-12-02 NOTE — Progress Notes (Signed)
Pre visit review using our clinic review tool, if applicable. No additional management support is needed unless otherwise documented below in the visit note. 

## 2013-12-02 NOTE — Patient Instructions (Signed)

## 2013-12-02 NOTE — Progress Notes (Signed)
Subjective:    Patient ID: Sherri Weber, female    DOB: 15-Jun-1933, 80 y.o.   MRN: [id number]  Arthritis Presents for follow-up visit. The disease course has been stable. She complains of pain. She reports no stiffness, joint swelling or joint warmth. Affected locations include the left knee and right knee. Her pain is at a severity of 2/10. Associated symptoms include pain at night and pain while resting. Pertinent negatives include no diarrhea, dry eyes, dry mouth, dysuria, fatigue, fever, rash, Raynaud's syndrome, uveitis or weight loss. Her pertinent risk factors include overuse. Past treatments include an opioid and acetaminophen. The treatment provided moderate relief. Factors aggravating her arthritis include activity. Compliance with prior treatments has been good. Compliance with total regimen is 76-100%.      Review of Systems  Constitutional: Negative.  Negative for fever, chills, weight loss, diaphoresis, appetite change and fatigue.  HENT: Negative.   Eyes: Negative.   Respiratory: Negative.  Negative for cough, shortness of breath and stridor.   Cardiovascular: Negative.  Negative for chest pain, palpitations and leg swelling.  Gastrointestinal: Negative.  Negative for nausea, vomiting, abdominal pain and diarrhea.  Endocrine: Negative.   Genitourinary: Negative.  Negative for dysuria.  Musculoskeletal: Positive for arthralgias, arthritis and back pain. Negative for gait problem, joint swelling, myalgias, neck pain, neck stiffness and stiffness.  Skin: Negative.  Negative for color change, pallor, rash and wound.  Allergic/Immunologic: Negative.   Neurological: Negative.   Hematological: Negative.  Negative for adenopathy. Does not bruise/bleed easily.  Psychiatric/Behavioral: Negative.        Objective:   Physical Exam  Vitals reviewed. Constitutional: She is oriented to person, place, and time. She appears well-developed and well-nourished. No distress.  HENT:    Head: Normocephalic and atraumatic.  Mouth/Throat: Oropharynx is clear and moist. No oropharyngeal exudate.  Eyes: Conjunctivae are normal. Right eye exhibits no discharge. Left eye exhibits no discharge. No scleral icterus.  Neck: Normal range of motion. Neck supple. No JVD present. No tracheal deviation present. No thyromegaly present.  Cardiovascular: Normal rate, regular rhythm, normal heart sounds and intact distal pulses.  Exam reveals no gallop and no friction rub.   No murmur heard. Pulmonary/Chest: Effort normal and breath sounds normal. No stridor. No respiratory distress. She has no wheezes. She has no rales. She exhibits no tenderness.  Abdominal: Soft. Bowel sounds are normal. She exhibits no distension and no mass. There is no tenderness. There is no rebound and no guarding.  Musculoskeletal: Normal range of motion. She exhibits no edema and no tenderness.  Lymphadenopathy:    She has no cervical adenopathy.  Neurological: She is oriented to person, place, and time.  Skin: Skin is warm and dry. No rash noted. She is not diaphoretic. No erythema. No pallor.  Psychiatric: She has a normal mood and affect. Her behavior is normal. Judgment and thought content normal.     Lab Results  Component Value Date   WBC 5.8 07/30/2013   HGB 12.5 07/30/2013   HCT 37.2 07/30/2013   PLT 262.0 07/30/2013   GLUCOSE 86 07/30/2013   CHOL 150 08/30/2013   TRIG 378.0* 08/30/2013   HDL 28.20* 08/30/2013   LDLDIRECT 60.2 08/30/2013   LDLCALC 104* 04/23/2012   ALT 10 07/30/2013   AST 17 07/30/2013   NA 140 07/30/2013   K 4.6 07/30/2013   CL 107 07/30/2013   CREATININE 0.9 07/30/2013   BUN 13 07/30/2013   CO2 28 07/30/2013   TSH 4.53  07/30/2013   INR 0.95 04/28/2013       Assessment & Plan:

## 2013-12-02 NOTE — Assessment & Plan Note (Signed)
She will continue hydrocodone as needed for pain 

## 2013-12-13 ENCOUNTER — Other Ambulatory Visit: Payer: Self-pay | Admitting: Internal Medicine

## 2013-12-27 ENCOUNTER — Ambulatory Visit: Payer: Medicare HMO | Admitting: Internal Medicine

## 2013-12-27 DIAGNOSIS — Z0289 Encounter for other administrative examinations: Secondary | ICD-10-CM

## 2014-01-08 ENCOUNTER — Ambulatory Visit: Payer: Medicare HMO | Admitting: Internal Medicine

## 2014-01-09 ENCOUNTER — Ambulatory Visit: Payer: Medicare HMO | Admitting: Internal Medicine

## 2014-01-15 ENCOUNTER — Encounter: Payer: Self-pay | Admitting: Internal Medicine

## 2014-01-15 ENCOUNTER — Ambulatory Visit (INDEPENDENT_AMBULATORY_CARE_PROVIDER_SITE_OTHER): Payer: Medicare Other | Admitting: Internal Medicine

## 2014-01-15 VITALS — BP 106/62 | HR 91 | Temp 97.5°F | Resp 16 | Ht 65.5 in | Wt 130.5 lb

## 2014-01-15 DIAGNOSIS — M545 Low back pain, unspecified: Secondary | ICD-10-CM

## 2014-01-15 DIAGNOSIS — E669 Obesity, unspecified: Secondary | ICD-10-CM

## 2014-01-15 DIAGNOSIS — M1990 Unspecified osteoarthritis, unspecified site: Secondary | ICD-10-CM

## 2014-01-15 MED ORDER — HYDROCODONE-ACETAMINOPHEN 7.5-300 MG PO TABS: 1.0000 | ORAL_TABLET | Freq: Three times a day (TID) | ORAL | Status: DC | PRN

## 2014-01-15 MED ORDER — PHENDIMETRAZINE TARTRATE ER 105 MG PO CP24: 1.0000 | ORAL_CAPSULE | Freq: Every day | ORAL | Status: DC

## 2014-01-15 NOTE — Patient Instructions (Signed)
Osteoarthritis Osteoarthritis is a disease that causes soreness and swelling (inflammation) of a joint. It occurs when the cartilage at the affected joint wears down. Cartilage acts as a cushion, covering the ends of bones where they meet to form a joint. Osteoarthritis is the most common form of arthritis. It often occurs in older people. The joints affected most often by this condition include those in the:  Ends of the fingers.  Thumbs.  Neck.  Lower back.  Knees.  Hips. CAUSES  Over time, the cartilage that covers the ends of bones begins to wear away. This causes bone to rub on bone, producing pain and stiffness in the affected joints.  RISK FACTORS Certain factors can increase your chances of having osteoarthritis, including:  Older age.  Excessive body weight.  Overuse of joints. SIGNS AND SYMPTOMS   Pain, swelling, and stiffness in the joint.  Over time, the joint may lose its normal shape.  Small deposits of bone (osteophytes) may grow on the edges of the joint.  Bits of bone or cartilage can break off and float inside the joint space. This may cause more pain and damage. DIAGNOSIS  Your health care provider will do a physical exam and ask about your symptoms. Various tests may be ordered, such as:  X-rays of the affected joint.  An MRI scan.  Blood tests to rule out other types of arthritis.  Joint fluid tests. This involves using a needle to draw fluid from the joint and examining the fluid under a microscope. TREATMENT  Goals of treatment are to control pain and improve joint function. Treatment plans may include:  A prescribed exercise program that allows for rest and joint relief.  A weight control plan.  Pain relief techniques, such as:  Properly applied heat and cold.  Electric pulses delivered to nerve endings under the skin (transcutaneous electrical nerve stimulation, TENS).  Massage.  Certain nutritional supplements.  Medicines to  control pain, such as:  Acetaminophen.  Nonsteroidal anti-inflammatory drugs (NSAIDs), such as naproxen.  Narcotic or central-acting agents, such as tramadol.  Corticosteroids. These can be given orally or as an injection.  Surgery to reposition the bones and relieve pain (osteotomy) or to remove loose pieces of bone and cartilage. Joint replacement may be needed in advanced states of osteoarthritis. HOME CARE INSTRUCTIONS   Only take over-the-counter or prescription medicines as directed by your health care provider. Take all medicines exactly as instructed.  Maintain a healthy weight. Follow your health care provider's instructions for weight control. This may include dietary instructions.  Exercise as directed. Your health care provider can recommend specific types of exercise. These may include:  Strengthening exercises These are done to strengthen the muscles that support joints affected by arthritis. They can be performed with weights or with exercise bands to add resistance.  Aerobic activities These are exercises, such as brisk walking or low-impact aerobics, that get your heart pumping.  Range-of-motion activities These keep your joints limber.  Balance and agility exercises These help you maintain daily living skills.  Rest your affected joints as directed by your health care provider.  Follow up with your health care provider as directed. SEEK MEDICAL CARE IF:   Your skin turns red.  You develop a rash in addition to your joint pain.  You have worsening joint pain. SEEK IMMEDIATE MEDICAL CARE IF:  You have a significant loss of weight or appetite.  You have a fever along with joint or muscle aches.  You have   night sweats. FOR MORE INFORMATION  National Institute of Arthritis and Musculoskeletal and Skin Diseases: www.niams.nih.gov National Institute on Aging: www.nia.nih.gov American College of Rheumatology: www.rheumatology.org Document Released: 12/12/2005  Document Revised: 10/02/2013 Document Reviewed: 08/19/2013 [employer] Patient Information 2014 [employer].  

## 2014-01-15 NOTE — Progress Notes (Signed)
Pre visit review using our clinic review tool, if applicable. No additional management support is needed unless otherwise documented below in the visit note. 

## 2014-01-15 NOTE — Progress Notes (Signed)
Subjective:    Patient ID: [name], female    DOB: [birth year], 80 y.o.   MRN: [id number]  Arthritis Presents for follow-up visit. The disease course has been fluctuating. The condition has lasted for 3 years. She complains of pain. She reports no stiffness, joint swelling or joint warmth. The symptoms have been stable. Affected locations include the left knee and right knee. Her pain is at a severity of 4/10. Associated symptoms include fatigue. Pertinent negatives include no diarrhea, dry eyes, dry mouth, dysuria, fever, pain at night, pain while resting, rash, Raynaud's syndrome, uveitis or weight loss. Her past medical history is significant for chronic back pain and osteoarthritis. Her pertinent risk factors include overuse. Past treatments include acetaminophen and an opioid. The treatment provided significant relief.      Review of Systems  Constitutional: Positive for fatigue. Negative for fever, chills, weight loss, diaphoresis, activity change, appetite change and unexpected weight change.  HENT: Negative.   Eyes: Negative.   Respiratory: Negative.  Negative for cough, choking, shortness of breath, wheezing and stridor.   Cardiovascular: Negative for chest pain, palpitations and leg swelling.  Gastrointestinal: Negative.  Negative for nausea, vomiting, abdominal pain, diarrhea, constipation and blood in stool.  Endocrine: Negative.   Genitourinary: Negative.  Negative for dysuria.  Musculoskeletal: Positive for arthralgias, arthritis and back pain. Negative for gait problem, joint swelling, myalgias, neck pain, neck stiffness and stiffness.  Skin: Negative.  Negative for rash.  Allergic/Immunologic: Negative.   Neurological: Negative.   Hematological: Negative.   Psychiatric/Behavioral: Negative.        Objective:   Physical Exam  Vitals reviewed. Constitutional: She is oriented to person, place, and time. She appears well-developed and well-nourished. No distress.   HENT:  Head: Normocephalic and atraumatic.  Mouth/Throat: Oropharynx is clear and moist. No oropharyngeal exudate.  Eyes: Conjunctivae are normal. Right eye exhibits no discharge. Left eye exhibits no discharge. No scleral icterus.  Neck: Normal range of motion. Neck supple. No JVD present. No tracheal deviation present. No thyromegaly present.  Cardiovascular: Normal rate, regular rhythm, normal heart sounds and intact distal pulses.  Exam reveals no gallop and no friction rub.   No murmur heard. Pulmonary/Chest: Effort normal and breath sounds normal. No stridor. No respiratory distress. She has no wheezes. She has no rales. She exhibits no tenderness.  Abdominal: Soft. Bowel sounds are normal. She exhibits no distension and no mass. There is no tenderness. There is no rebound and no guarding.  Musculoskeletal: Normal range of motion. She exhibits edema (1+ pitting edema in BLE). She exhibits no tenderness.       Right knee: Normal. She exhibits normal range of motion, no swelling and no effusion. No tenderness found.       Left knee: Normal. She exhibits normal range of motion, no swelling and no effusion. No tenderness found.  Lymphadenopathy:    She has no cervical adenopathy.  Neurological: She is oriented to person, place, and time.  Skin: Skin is warm. No rash noted. She is not diaphoretic. No erythema. No pallor.  Psychiatric: She has a normal mood and affect. Her behavior is normal. Judgment and thought content normal.     Lab Results  Component Value Date   WBC 5.8 07/30/2013   HGB 12.5 07/30/2013   HCT 37.2 07/30/2013   PLT 262.0 07/30/2013   GLUCOSE 86 07/30/2013   CHOL 150 08/30/2013   TRIG 378.0* 08/30/2013   HDL 28.20* 08/30/2013   LDLDIRECT 60.2 08/30/2013  LDLCALC 104* 04/23/2012   ALT 10 07/30/2013   AST 17 07/30/2013   NA 140 07/30/2013   K 4.6 07/30/2013   CL 107 07/30/2013   CREATININE 0.9 07/30/2013   BUN 13 07/30/2013   CO2 28 07/30/2013   TSH 4.53 07/30/2013   INR 0.95 04/28/2013        Assessment & Plan:

## 2014-01-16 NOTE — Assessment & Plan Note (Signed)
She will cont norco as needed for pain 

## 2014-01-16 NOTE — Assessment & Plan Note (Signed)
She has fatigue and weight gain, will try phendimetrazine again

## 2014-01-20 ENCOUNTER — Encounter (INDEPENDENT_AMBULATORY_CARE_PROVIDER_SITE_OTHER): Payer: Medicare HMO | Admitting: General Surgery

## 2014-01-20 ENCOUNTER — Other Ambulatory Visit (INDEPENDENT_AMBULATORY_CARE_PROVIDER_SITE_OTHER): Payer: Self-pay

## 2014-01-20 ENCOUNTER — Telehealth (INDEPENDENT_AMBULATORY_CARE_PROVIDER_SITE_OTHER): Payer: Self-pay

## 2014-01-20 MED ORDER — HYDROCORTISONE 2.5 % RE CREA: TOPICAL_CREAM | Freq: Two times a day (BID) | RECTAL | Status: DC

## 2014-01-20 NOTE — Telephone Encounter (Signed)
Pt called stating she was unable to come to her appointment today, she is feeling very weak.  She is having a lot of pain in her rectal area.  Refill of Anusol cream was sent to [facility].  Pt is aware.

## 2014-01-23 ENCOUNTER — Telehealth (INDEPENDENT_AMBULATORY_CARE_PROVIDER_SITE_OTHER): Payer: Self-pay

## 2014-01-23 ENCOUNTER — Telehealth: Payer: Self-pay

## 2014-01-23 NOTE — Telephone Encounter (Signed)
Phone call from patient stating she can not get a refill on Hydrocodone til the 8th. She is requesting Tramadol or something until then. She states she is in a lot of pain. Please advise.

## 2014-01-23 NOTE — Telephone Encounter (Signed)
Pt called wanting to know if Dr Abbey Chattersosenbower had earlier appt. Pt states cream is not resolving symptoms. Pt has not been using wet wipes or warm soaks. Pt states she cannot get in tub.  I advised pt to use wet wipes instead of toilet paper and to do warm compresses to anal area 3-4 x a day. Pt will try these measures to see if they will help cream to improve hems. Pt also wanted to know if Dr Abbey Chattersosenbower would be willing to give her a narcotic pain medication or something to help her rest. I advised pt that she should contact Dr Yetta BarreJones for any pain medication needs. She states she understands.  Pt will keep appt Monday with Dr Abbey Chattersosenbower.

## 2014-01-27 ENCOUNTER — Encounter (INDEPENDENT_AMBULATORY_CARE_PROVIDER_SITE_OTHER): Payer: Self-pay | Admitting: General Surgery

## 2014-01-27 ENCOUNTER — Ambulatory Visit (INDEPENDENT_AMBULATORY_CARE_PROVIDER_SITE_OTHER): Payer: Medicare Other | Admitting: General Surgery

## 2014-01-27 VITALS — BP 150/90 | HR 76 | Temp 98.6°F | Resp 14 | Ht 65.5 in | Wt 124.6 lb

## 2014-01-27 DIAGNOSIS — M25559 Pain in unspecified hip: Secondary | ICD-10-CM | POA: Insufficient documentation

## 2014-01-27 NOTE — Progress Notes (Signed)
Subjective:     Patient ID: Sherri Weber, female   DOB: 06/18/1933, 80 y.o.   MRN: [id number]  HPI  Sherri Weber comes in today complaining of pelvic and anal pain similar to  What she had prior to her colostomy by Dr. Emogene Weber.  She also reports having an episode of diarrhea out of her anus. No bleeding. She was seen in urgent care and was given some suppositories without much relief. She takes chronic Norco as well.   Review of Systems     Objective:   Physical Exam General-Thin female in NAD Abd-Left sided colostomy Anorectal-Multiple soft skin tags, no fissure, no mass on DRE, some pasty stool is present.  Anoscopy-no masses, pasty stool present    Assessment:     Recurrence of her pelvic and anal pain similar to what she had prior to the colostomy. I cannot find any reason for this pain on my examination today.    Plan:     [facility] for further evaluation.

## 2014-01-27 NOTE — Patient Instructions (Signed)
You have an appointment with Dr. Emogene MorganBohl next Tuesday.

## 2014-01-28 ENCOUNTER — Other Ambulatory Visit: Payer: Self-pay | Admitting: Internal Medicine

## 2014-02-24 ENCOUNTER — Encounter: Payer: Self-pay | Admitting: Internal Medicine

## 2014-02-24 ENCOUNTER — Ambulatory Visit (INDEPENDENT_AMBULATORY_CARE_PROVIDER_SITE_OTHER): Payer: Medicare Other | Admitting: Internal Medicine

## 2014-02-24 ENCOUNTER — Other Ambulatory Visit (INDEPENDENT_AMBULATORY_CARE_PROVIDER_SITE_OTHER): Payer: Medicare Other

## 2014-02-24 VITALS — BP 142/82 | HR 88 | Temp 98.5°F | Resp 16 | Ht 65.5 in | Wt 129.0 lb

## 2014-02-24 DIAGNOSIS — M1990 Unspecified osteoarthritis, unspecified site: Secondary | ICD-10-CM

## 2014-02-24 DIAGNOSIS — E785 Hyperlipidemia, unspecified: Secondary | ICD-10-CM

## 2014-02-24 DIAGNOSIS — M545 Low back pain, unspecified: Secondary | ICD-10-CM

## 2014-02-24 LAB — LIPID PANEL
Cholesterol: 218 mg/dL — ABNORMAL HIGH (ref 0–200)
HDL: 30.9 mg/dL — ABNORMAL LOW (ref >39.00)
LDL Cholesterol: 122 mg/dL — ABNORMAL HIGH (ref 0–99)
Total CHOL/HDL Ratio: 7
Triglycerides: 324 mg/dL — ABNORMAL HIGH (ref 0.0–149.0)
VLDL: 64.8 mg/dL — ABNORMAL HIGH (ref 0.0–40.0)

## 2014-02-24 MED ORDER — HYDROCODONE-ACETAMINOPHEN 7.5-300 MG PO TABS: 1.0000 | ORAL_TABLET | Freq: Three times a day (TID) | ORAL | Status: DC | PRN

## 2014-02-24 NOTE — Progress Notes (Signed)
Pre visit review using our clinic review tool, if applicable. No additional management support is needed unless otherwise documented below in the visit note. 

## 2014-02-24 NOTE — Progress Notes (Signed)
Subjective:    Patient ID: Sherri Weber, female    DOB: 1933-11-21, 80 y.o.   MRN: [id number]  Hyperlipidemia This is a chronic problem. The current episode started more than 1 year ago. The problem is uncontrolled. Recent lipid tests were reviewed and are variable. Exacerbating diseases include obesity. She has no history of chronic renal disease, diabetes, hypothyroidism, liver disease or nephrotic syndrome. Factors aggravating her hyperlipidemia include fatty foods. Pertinent negatives include no chest pain, focal sensory loss, focal weakness, leg pain, myalgias or shortness of breath. Current antihyperlipidemic treatment includes diet change. The current treatment provides mild improvement of lipids. Compliance problems include adherence to diet and adherence to exercise.       Review of Systems  Constitutional: Negative.  Negative for fever, chills, diaphoresis, appetite change and fatigue.  HENT: Negative.   Eyes: Negative.   Respiratory: Negative.  Negative for cough, choking, chest tightness, shortness of breath and stridor.   Cardiovascular: Negative.  Negative for chest pain, palpitations and leg swelling.  Gastrointestinal: Negative.  Negative for nausea, vomiting, abdominal pain, diarrhea, constipation and blood in stool.  Endocrine: Negative.   Genitourinary: Negative.   Musculoskeletal: Positive for arthralgias and back pain. Negative for gait problem, joint swelling, myalgias, neck pain and neck stiffness.  Skin: Negative.   Allergic/Immunologic: Negative.   Neurological: Negative.  Negative for dizziness, tremors, focal weakness, seizures, weakness and light-headedness.  Hematological: Negative.  Negative for adenopathy. Does not bruise/bleed easily.  Psychiatric/Behavioral: Negative for suicidal ideas, confusion, sleep disturbance, self-injury, dysphoric mood, decreased concentration and agitation. The patient is nervous/anxious.        Objective:   Physical Exam    Vitals reviewed. Constitutional: She is oriented to person, place, and time. She appears well-developed and well-nourished. No distress.  HENT:  Head: Normocephalic and atraumatic.  Mouth/Throat: Oropharynx is clear and moist. No oropharyngeal exudate.  Eyes: Conjunctivae are normal. Right eye exhibits no discharge. Left eye exhibits no discharge. No scleral icterus.  Neck: Normal range of motion. Neck supple. No JVD present. No tracheal deviation present. No thyromegaly present.  Cardiovascular: Normal rate, regular rhythm, normal heart sounds and intact distal pulses.  Exam reveals no gallop and no friction rub.   No murmur heard. Pulmonary/Chest: Effort normal and breath sounds normal. No stridor. No respiratory distress. She has no wheezes. She has no rales. She exhibits no tenderness.  Abdominal: Soft. Bowel sounds are normal. She exhibits no distension and no mass. There is no tenderness. There is no rebound and no guarding.  Musculoskeletal: Normal range of motion. She exhibits no edema and no tenderness.  Lymphadenopathy:    She has no cervical adenopathy.  Neurological: She is oriented to person, place, and time.  Skin: Skin is warm and dry. No rash noted. She is not diaphoretic. No erythema. No pallor.      Lab Results  Component Value Date   WBC 5.8 07/30/2013   HGB 12.5 07/30/2013   HCT 37.2 07/30/2013   PLT 262.0 07/30/2013   GLUCOSE 86 07/30/2013   CHOL 150 08/30/2013   TRIG 378.0* 08/30/2013   HDL 28.20* 08/30/2013   LDLDIRECT 60.2 08/30/2013   LDLCALC 104* 04/23/2012   ALT 10 07/30/2013   AST 17 07/30/2013   NA 140 07/30/2013   K 4.6 07/30/2013   CL 107 07/30/2013   CREATININE 0.9 07/30/2013   BUN 13 07/30/2013   CO2 28 07/30/2013   TSH 4.53 07/30/2013   INR 0.95 04/28/2013  Assessment & Plan:

## 2014-02-24 NOTE — Patient Instructions (Signed)
Hypertriglyceridemia  Diet for High blood levels of Triglycerides Most fats in food are triglycerides. Triglycerides in your blood are stored as fat in your body. High levels of triglycerides in your blood may put you at a greater risk for heart disease and stroke.  Normal triglyceride levels are less than 150 mg/dL. Borderline high levels are 150-199 mg/dl. High levels are 200 - 499 mg/dL, and very high triglyceride levels are greater than 500 mg/dL. The decision to treat high triglycerides is generally based on the level. For people with borderline or high triglyceride levels, treatment includes weight loss and exercise. Drugs are recommended for people with very high triglyceride levels. Many people who need treatment for high triglyceride levels have metabolic syndrome. This syndrome is a collection of disorders that often include: insulin resistance, high blood pressure, blood clotting problems, high cholesterol and triglycerides. TESTING PROCEDURE FOR TRIGLYCERIDES  You should not eat 4 hours before getting your triglycerides measured. The normal range of triglycerides is between 10 and 250 milligrams per deciliter (mg/dl). Some people may have extreme levels (1000 or above), but your triglyceride level may be too high if it is above 150 mg/dl, depending on what other risk factors you have for heart disease.  People with high blood triglycerides may also have high blood cholesterol levels. If you have high blood cholesterol as well as high blood triglycerides, your risk for heart disease is probably greater than if you only had high triglycerides. High blood cholesterol is one of the main risk factors for heart disease. CHANGING YOUR DIET  Your weight can affect your blood triglyceride level. If you are more than 20% above your ideal body weight, you may be able to lower your blood triglycerides by losing weight. Eating less and exercising regularly is the best way to combat this. Fat provides more  calories than any other food. The best way to lose weight is to eat less fat. Only 30% of your total calories should come from fat. Less than 7% of your diet should come from saturated fat. A diet low in fat and saturated fat is the same as a diet to decrease blood cholesterol. By eating a diet lower in fat, you may lose weight, lower your blood cholesterol, and lower your blood triglyceride level.  Eating a diet low in fat, especially saturated fat, may also help you lower your blood triglyceride level. Ask your dietitian to help you figure how much fat you can eat based on the number of calories your caregiver has prescribed for you.  Exercise, in addition to helping with weight loss may also help lower triglyceride levels.   Alcohol can increase blood triglycerides. You may need to stop drinking alcoholic beverages.  Too much carbohydrate in your diet may also increase your blood triglycerides. Some complex carbohydrates are necessary in your diet. These may include bread, rice, potatoes, other starchy vegetables and cereals.  Reduce "simple" carbohydrates. These may include pure sugars, candy, honey, and jelly without losing other nutrients. If you have the kind of high blood triglycerides that is affected by the amount of carbohydrates in your diet, you will need to eat less sugar and less high-sugar foods. Your caregiver can help you with this.  Adding 2-4 grams of fish oil (EPA+ DHA) may also help lower triglycerides. Speak with your caregiver before adding any supplements to your regimen. Following the Diet  Maintain your ideal weight. Your caregivers can help you with a diet. Generally, eating less food and getting more   exercise will help you lose weight. Joining a weight control group may also help. Ask your caregivers for a good weight control group in your area.  Eat low-fat foods instead of high-fat foods. This can help you lose weight too.  These foods are lower in fat. Eat MORE of these:    Dried beans, peas, and lentils.  Egg whites.  Low-fat cottage cheese.  Fish.  Lean cuts of meat, such as round, sirloin, rump, and flank (cut extra fat off meat you fix).  Whole grain breads, cereals and pasta.  Skim and nonfat dry milk.  Low-fat yogurt.  Poultry without the skin.  Cheese made with skim or part-skim milk, such as mozzarella, parmesan, farmers', ricotta, or pot cheese. These are higher fat foods. Eat LESS of these:   Whole milk and foods made from whole milk, such as American, blue, cheddar, monterey jack, and swiss cheese  High-fat meats, such as luncheon meats, sausages, knockwurst, bratwurst, hot dogs, ribs, corned beef, ground pork, and regular ground beef.  Fried foods. Limit saturated fats in your diet. Substituting unsaturated fat for saturated fat may decrease your blood triglyceride level. You will need to read package labels to know which products contain saturated fats.  These foods are high in saturated fat. Eat LESS of these:   Fried pork skins.  Whole milk.  Skin and fat from poultry.  Palm oil.  Butter.  Shortening.  Cream cheese.  Bacon.  Margarines and baked goods made from listed oils.  Vegetable shortenings.  Chitterlings.  Fat from meats.  Coconut oil.  Palm kernel oil.  Lard.  Cream.  Sour cream.  Fatback.  Coffee whiteners and non-dairy creamers made with these oils.  Cheese made from whole milk. Use unsaturated fats (both polyunsaturated and monounsaturated) moderately. Remember, even though unsaturated fats are better than saturated fats; you still want a diet low in total fat.  These foods are high in unsaturated fat:   Canola oil.  Sunflower oil.  Mayonnaise.  Almonds.  Peanuts.  Pine nuts.  Margarines made with these oils.  Safflower oil.  Olive oil.  Avocados.  Cashews.  Peanut butter.  Sunflower seeds.  Soybean oil.  Peanut  oil.  Olives.  Pecans.  Walnuts.  Pumpkin seeds. Avoid sugar and other high-sugar foods. This will decrease carbohydrates without decreasing other nutrients. Sugar in your food goes rapidly to your blood. When there is excess sugar in your blood, your liver may use it to make more triglycerides. Sugar also contains calories without other important nutrients.  Eat LESS of these:   Sugar, brown sugar, powdered sugar, jam, jelly, preserves, honey, syrup, molasses, pies, candy, cakes, cookies, frosting, pastries, colas, soft drinks, punches, fruit drinks, and regular gelatin.  Avoid alcohol. Alcohol, even more than sugar, may increase blood triglycerides. In addition, alcohol is high in calories and low in nutrients. Ask for sparkling water, or a diet soft drink instead of an alcoholic beverage. Suggestions for planning and preparing meals   Bake, broil, grill or roast meats instead of frying.  Remove fat from meats and skin from poultry before cooking.  Add spices, herbs, lemon juice or vinegar to vegetables instead of salt, rich sauces or gravies.  Use a non-stick skillet without fat or use no-stick sprays.  Cool and refrigerate stews and broth. Then remove the hardened fat floating on the surface before serving.  Refrigerate meat drippings and skim off fat to make low-fat gravies.  Serve more fish.  Use less butter,   margarine and other high-fat spreads on bread or vegetables.  Use skim or reconstituted non-fat dry milk for cooking.  Cook with low-fat cheeses.  Substitute low-fat yogurt or cottage cheese for all or part of the sour cream in recipes for sauces, dips or congealed salads.  Use half yogurt/half mayonnaise in salad recipes.  Substitute evaporated skim milk for cream. Evaporated skim milk or reconstituted non-fat dry milk can be whipped and substituted for whipped cream in certain recipes.  Choose fresh fruits for dessert instead of high-fat foods such as pies or  cakes. Fruits are naturally low in fat. When Dining Out   Order low-fat appetizers such as fruit or vegetable juice, pasta with vegetables or tomato sauce.  Select clear, rather than cream soups.  Ask that dressings and gravies be served on the side. Then use less of them.  Order foods that are baked, broiled, poached, steamed, stir-fried, or roasted.  Ask for margarine instead of butter, and use only a small amount.  Drink sparkling water, unsweetened tea or coffee, or diet soft drinks instead of alcohol or other sweet beverages. QUESTIONS AND ANSWERS ABOUT OTHER FATS IN THE BLOOD: SATURATED FAT, TRANS FAT, AND CHOLESTEROL What is trans fat? Trans fat is a type of fat that is formed when vegetable oil is hardened through a process called hydrogenation. This process helps makes foods more solid, gives them shape, and prolongs their shelf life. Trans fats are also called hydrogenated or partially hydrogenated oils.  What do saturated fat, trans fat, and cholesterol in foods have to do with heart disease? Saturated fat, trans fat, and cholesterol in the diet all raise the level of LDL "bad" cholesterol in the blood. The higher the LDL cholesterol, the greater the risk for coronary heart disease (CHD). Saturated fat and trans fat raise LDL similarly.  What foods contain saturated fat, trans fat, and cholesterol? High amounts of saturated fat are found in animal products, such as fatty cuts of meat, chicken skin, and full-fat dairy products like butter, whole milk, cream, and cheese, and in tropical vegetable oils such as palm, palm kernel, and coconut oil. Trans fat is found in some of the same foods as saturated fat, such as vegetable shortening, some margarines (especially hard or stick margarine), crackers, cookies, baked goods, fried foods, salad dressings, and other processed foods made with partially hydrogenated vegetable oils. Small amounts of trans fat also occur naturally in some animal  products, such as milk products, beef, and lamb. Foods high in cholesterol include liver, other organ meats, egg yolks, shrimp, and full-fat dairy products. How can I use the new food label to make heart-healthy food choices? Check the Nutrition Facts panel of the food label. Choose foods lower in saturated fat, trans fat, and cholesterol. For saturated fat and cholesterol, you can also use the Percent Daily Value (%DV): 5% DV or less is low, and 20% DV or more is high. (There is no %DV for trans fat.) Use the Nutrition Facts panel to choose foods low in saturated fat and cholesterol, and if the trans fat is not listed, read the ingredients and limit products that list shortening or hydrogenated or partially hydrogenated vegetable oil, which tend to be high in trans fat. POINTS TO REMEMBER:   Discuss your risk for heart disease with your caregivers, and take steps to reduce risk factors.  Change your diet. Choose foods that are low in saturated fat, trans fat, and cholesterol.  Add exercise to your daily routine if   it is not already being done. Participate in physical activity of moderate intensity, like brisk walking, for at least 30 minutes on most, and preferably all days of the week. No time? Break the 30 minutes into three, 10-minute segments during the day.  Stop smoking. If you do smoke, contact your caregiver to discuss ways in which they can help you quit.  Do not use street drugs.  Maintain a normal weight.  Maintain a healthy blood pressure.  Keep up with your blood work for checking the fats in your blood as directed by your caregiver. Document Released: 09/29/2004 Document Revised: 06/12/2012 Document Reviewed: 04/27/2009 [employer] Patient Information 2014 [employer].  

## 2014-02-24 NOTE — Assessment & Plan Note (Signed)
Her LDL and trigs are slightly elevated This does not warrant medical therapy She will improve her lifestyle modifications

## 2014-02-24 NOTE — Assessment & Plan Note (Signed)
Cont hydrocodone as needed for pain

## 2014-02-24 NOTE — Assessment & Plan Note (Signed)
Cont the current meds for pain

## 2014-03-12 ENCOUNTER — Other Ambulatory Visit: Payer: Self-pay | Admitting: Internal Medicine

## 2014-03-17 ENCOUNTER — Encounter: Payer: Self-pay | Admitting: Internal Medicine

## 2014-03-18 ENCOUNTER — Ambulatory Visit: Payer: Medicare Other | Admitting: Internal Medicine

## 2014-03-21 ENCOUNTER — Ambulatory Visit: Payer: Medicare Other | Admitting: Internal Medicine

## 2014-03-26 ENCOUNTER — Ambulatory Visit: Payer: Medicare Other | Admitting: Internal Medicine

## 2014-04-02 ENCOUNTER — Ambulatory Visit: Payer: Medicare Other | Admitting: Internal Medicine

## 2014-04-09 ENCOUNTER — Ambulatory Visit: Payer: Medicare Other | Admitting: Internal Medicine

## 2014-04-11 ENCOUNTER — Telehealth: Payer: Self-pay | Admitting: Internal Medicine

## 2014-04-11 NOTE — Telephone Encounter (Signed)
yes

## 2014-04-11 NOTE — Telephone Encounter (Signed)
Patient's daughter is calling to see if a letter can be faxed to the patient's apartment complex stating that she needs to move out to a safer environment. Letter was given to patient in June 2014, but according to daughter the patient would not agree to move out during that time. They are wanting a letter with today's date sent to the apartment complex as soon as possible.  Fax# [phone]. Please advise if okay to send letter.

## 2014-04-14 ENCOUNTER — Encounter: Payer: Self-pay | Admitting: Internal Medicine

## 2014-04-14 ENCOUNTER — Ambulatory Visit (INDEPENDENT_AMBULATORY_CARE_PROVIDER_SITE_OTHER): Payer: Medicare Other | Admitting: Internal Medicine

## 2014-04-14 VITALS — BP 126/68 | HR 100 | Temp 97.8°F | Resp 12 | Ht 65.5 in | Wt 132.5 lb

## 2014-04-14 DIAGNOSIS — S79919A Unspecified injury of unspecified hip, initial encounter: Secondary | ICD-10-CM

## 2014-04-14 DIAGNOSIS — M545 Low back pain, unspecified: Secondary | ICD-10-CM

## 2014-04-14 DIAGNOSIS — S79921A Unspecified injury of right thigh, initial encounter: Secondary | ICD-10-CM

## 2014-04-14 DIAGNOSIS — M1990 Unspecified osteoarthritis, unspecified site: Secondary | ICD-10-CM

## 2014-04-14 DIAGNOSIS — IMO0002 Reserved for concepts with insufficient information to code with codable children: Secondary | ICD-10-CM

## 2014-04-14 DIAGNOSIS — S79929A Unspecified injury of unspecified thigh, initial encounter: Secondary | ICD-10-CM

## 2014-04-14 DIAGNOSIS — S3992XA Unspecified injury of lower back, initial encounter: Secondary | ICD-10-CM | POA: Insufficient documentation

## 2014-04-14 MED ORDER — HYDROCODONE-ACETAMINOPHEN 7.5-300 MG PO TABS: 1.0000 | ORAL_TABLET | Freq: Three times a day (TID) | ORAL | Status: DC | PRN

## 2014-04-14 NOTE — Progress Notes (Signed)
Subjective:    Patient ID: Sherri Weber, female    DOB: [birth year], 80 y.o.   MRN: [id number]  Injury The incident occurred 5 to 7 days ago. The incident occurred at home. The injury mechanism was a fall. There is an injury to the right thigh (right thigh and lower back). The pain is mild. It is unlikely that a foreign body is present. Pertinent negatives include no abdominal pain, abnormal behavior, chest pain, coughing, difficulty breathing, headaches, hearing loss, inability to bear weight, light-headedness, loss of consciousness, memory loss, nausea, neck pain, numbness, seizures, tingling, visual disturbance, vomiting or weakness. There have been no prior injuries to these areas. Her tetanus status is UTD.      Review of Systems  Constitutional: Negative.  Negative for fever, chills, diaphoresis, appetite change and fatigue.  HENT: Negative.  Negative for hearing loss.   Eyes: Negative.  Negative for visual disturbance.  Respiratory: Negative.  Negative for cough, choking, chest tightness, shortness of breath and stridor.   Cardiovascular: Negative.  Negative for chest pain, palpitations and leg swelling.  Gastrointestinal: Negative.  Negative for nausea, vomiting, abdominal pain, diarrhea and constipation.  Endocrine: Negative.   Genitourinary: Negative.   Musculoskeletal: Positive for arthralgias and back pain. Negative for gait problem, joint swelling, myalgias, neck pain and neck stiffness.  Skin: Negative.   Allergic/Immunologic: Negative.   Neurological: Negative.  Negative for dizziness, tingling, seizures, loss of consciousness, facial asymmetry, speech difficulty, weakness, light-headedness, numbness and headaches.  Hematological: Negative.  Negative for adenopathy. Does not bruise/bleed easily.  Psychiatric/Behavioral: Negative.  Negative for memory loss.       Objective:   Physical Exam  Vitals reviewed. Constitutional: She is oriented to person, place, and time.  She appears well-developed and well-nourished. No distress.  HENT:  Head: Normocephalic and atraumatic.  Mouth/Throat: Oropharynx is clear and moist. No oropharyngeal exudate.  Eyes: Conjunctivae are normal. Right eye exhibits no discharge. Left eye exhibits no discharge. No scleral icterus.  Neck: Normal range of motion. Neck supple. No JVD present. No tracheal deviation present. No thyromegaly present.  Cardiovascular: Normal rate, regular rhythm, normal heart sounds and intact distal pulses.  Exam reveals no gallop and no friction rub.   No murmur heard. Pulmonary/Chest: Effort normal and breath sounds normal. No stridor. No respiratory distress. She has no wheezes. She has no rales. She exhibits no tenderness.  Abdominal: Soft. Bowel sounds are normal. She exhibits no distension and no mass. There is no tenderness. There is no rebound and no guarding.  Musculoskeletal: Normal range of motion. She exhibits no edema and no tenderness.       Lumbar back: She exhibits deformity. She exhibits normal range of motion, no tenderness, no bony tenderness, no swelling, no edema, no laceration, no pain, no spasm and normal pulse.       Back:       Right upper leg: She exhibits deformity. She exhibits no tenderness, no bony tenderness, no swelling, no edema and no laceration.       Legs: Lymphadenopathy:    She has no cervical adenopathy.  Neurological: She is alert and oriented to person, place, and time. She has normal reflexes. She displays normal reflexes. No cranial nerve deficit. She exhibits normal muscle tone. Coordination normal.  Skin: Skin is warm and dry. No rash noted. She is not diaphoretic. No erythema. No pallor.     Lab Results  Component Value Date   WBC 5.8 07/30/2013   HGB 12.5 07/30/2013  HCT 37.2 07/30/2013   PLT 262.0 07/30/2013   GLUCOSE 86 07/30/2013   CHOL 218* 02/24/2014   TRIG 324.0* 02/24/2014   HDL 30.90* 02/24/2014   LDLDIRECT 60.2 08/30/2013   LDLCALC 122* 02/24/2014   ALT 10  07/30/2013   AST 17 07/30/2013   NA 140 07/30/2013   K 4.6 07/30/2013   CL 107 07/30/2013   CREATININE 0.9 07/30/2013   BUN 13 07/30/2013   CO2 28 07/30/2013   TSH 4.53 07/30/2013   INR 0.95 04/28/2013       Assessment & Plan:

## 2014-04-14 NOTE — Patient Instructions (Signed)
Back Pain, Adult Low back pain is very common. About 1 in 5 people have back pain.The cause of low back pain is rarely dangerous. The pain often gets better over time.About half of people with a sudden onset of back pain feel better in just 2 weeks. About 8 in 10 people feel better by 6 weeks.  CAUSES Some common causes of back pain include:  Strain of the muscles or ligaments supporting the spine.  Wear and tear (degeneration) of the spinal discs.  Arthritis.  Direct injury to the back. DIAGNOSIS Most of the time, the direct cause of low back pain is not known.However, back pain can be treated effectively even when the exact cause of the pain is unknown.Answering your caregiver's questions about your overall health and symptoms is one of the most accurate ways to make sure the cause of your pain is not dangerous. If your caregiver needs more information, he or she may order lab work or imaging tests (X-rays or MRIs).However, even if imaging tests show changes in your back, this usually does not require surgery. HOME CARE INSTRUCTIONS For many people, back pain returns.Since low back pain is rarely dangerous, it is often a condition that people can learn to manageon their own.   Remain active. It is stressful on the back to sit or stand in one place. Do not sit, drive, or stand in one place for more than 30 minutes at a time. Take short walks on level surfaces as soon as pain allows.Try to increase the length of time you walk each day.  Do not stay in bed.Resting more than 1 or 2 days can delay your recovery.  Do not avoid exercise or work.Your body is made to move.It is not dangerous to be active, even though your back may hurt.Your back will likely heal faster if you return to being active before your pain is gone.  Pay attention to your body when you bend and lift. Many people have less discomfortwhen lifting if they bend their knees, keep the load close to their bodies,and  avoid twisting. Often, the most comfortable positions are those that put less stress on your recovering back.  Find a comfortable position to sleep. Use a firm mattress and lie on your side with your knees slightly bent. If you lie on your back, put a pillow under your knees.  Only take over-the-counter or prescription medicines as directed by your caregiver. Over-the-counter medicines to reduce pain and inflammation are often the most helpful.Your caregiver may prescribe muscle relaxant drugs.These medicines help dull your pain so you can more quickly return to your normal activities and healthy exercise.  Put ice on the injured area.  Put ice in a plastic bag.  Place a towel between your skin and the bag.  Leave the ice on for 15-20 minutes, 03-04 times a day for the first 2 to 3 days. After that, ice and heat may be alternated to reduce pain and spasms.  Ask your caregiver about trying back exercises and gentle massage. This may be of some benefit.  Avoid feeling anxious or stressed.Stress increases muscle tension and can worsen back pain.It is important to recognize when you are anxious or stressed and learn ways to manage it.Exercise is a great option. SEEK MEDICAL CARE IF:  You have pain that is not relieved with rest or medicine.  You have pain that does not improve in 1 week.  You have new symptoms.  You are generally not feeling well. SEEK   IMMEDIATE MEDICAL CARE IF:   You have pain that radiates from your back into your legs.  You develop new bowel or bladder control problems.  You have unusual weakness or numbness in your arms or legs.  You develop nausea or vomiting.  You develop abdominal pain.  You feel faint. Document Released: 12/12/2005 Document Revised: 06/12/2012 Document Reviewed: 05/02/2011 [employer] Patient Information 2014 [employer].  

## 2014-04-14 NOTE — Progress Notes (Signed)
Pre visit review using our clinic review tool, if applicable. No additional management support is needed unless otherwise documented below in the visit note. 

## 2014-04-15 ENCOUNTER — Encounter: Payer: Self-pay | Admitting: Internal Medicine

## 2014-04-15 ENCOUNTER — Ambulatory Visit: Payer: Medicare Other | Admitting: Internal Medicine

## 2014-04-15 NOTE — Assessment & Plan Note (Signed)
This appears to be a simple contusion I will check plain films to screen for vertebral fracture Cont norco as needed for pain

## 2014-04-15 NOTE — Assessment & Plan Note (Signed)
This appears to be a simple contusion Will check a plain film to look for fracture She will cont norco as needed for pain

## 2014-04-18 ENCOUNTER — Ambulatory Visit (INDEPENDENT_AMBULATORY_CARE_PROVIDER_SITE_OTHER)
Admission: RE | Admit: 2014-04-18 | Discharge: 2014-04-18 | Disposition: A | Discharge status: Home / Self Care | Payer: Medicare Other | Source: Ambulatory Visit | Attending: Internal Medicine | Admitting: Internal Medicine

## 2014-04-18 ENCOUNTER — Other Ambulatory Visit: Payer: Self-pay | Admitting: Internal Medicine

## 2014-04-18 DIAGNOSIS — S79919A Unspecified injury of unspecified hip, initial encounter: Secondary | ICD-10-CM

## 2014-04-18 DIAGNOSIS — S79921A Unspecified injury of right thigh, initial encounter: Secondary | ICD-10-CM

## 2014-04-18 DIAGNOSIS — S79929A Unspecified injury of unspecified thigh, initial encounter: Secondary | ICD-10-CM

## 2014-04-18 DIAGNOSIS — S32009A Unspecified fracture of unspecified lumbar vertebra, initial encounter for closed fracture: Secondary | ICD-10-CM

## 2014-04-18 DIAGNOSIS — S3992XA Unspecified injury of lower back, initial encounter: Secondary | ICD-10-CM

## 2014-04-18 DIAGNOSIS — IMO0002 Reserved for concepts with insufficient information to code with codable children: Secondary | ICD-10-CM

## 2014-04-21 ENCOUNTER — Other Ambulatory Visit: Payer: Self-pay | Admitting: Internal Medicine

## 2014-04-21 ENCOUNTER — Telehealth: Payer: Self-pay

## 2014-04-21 DIAGNOSIS — S32009A Unspecified fracture of unspecified lumbar vertebra, initial encounter for closed fracture: Secondary | ICD-10-CM

## 2014-04-21 DIAGNOSIS — M545 Low back pain, unspecified: Secondary | ICD-10-CM

## 2014-04-21 NOTE — Telephone Encounter (Signed)
Called pt this am to advise on xray results. While speaking to pt, she requested to schedule appointment with MD this week. Pt aware MD is out of the office both Thursday and Friday, offered 04/23/14 Wednesday at 11:15. After reviewing schedule, appt already taken, returned call to advise that we will notify of cx.

## 2014-04-23 ENCOUNTER — Ambulatory Visit
Admission: RE | Admit: 2014-04-23 | Discharge: 2014-04-23 | Disposition: A | Discharge status: Home / Self Care | Payer: Medicare Other | Source: Ambulatory Visit | Attending: Internal Medicine | Admitting: Internal Medicine

## 2014-04-23 ENCOUNTER — Encounter: Payer: Self-pay | Admitting: Internal Medicine

## 2014-04-23 DIAGNOSIS — M545 Low back pain, unspecified: Secondary | ICD-10-CM

## 2014-04-23 DIAGNOSIS — S32009A Unspecified fracture of unspecified lumbar vertebra, initial encounter for closed fracture: Secondary | ICD-10-CM

## 2014-04-24 ENCOUNTER — Telehealth: Payer: Self-pay | Admitting: Internal Medicine

## 2014-04-24 NOTE — Telephone Encounter (Signed)
MD is out of the office until Monday, will forward to another MD

## 2014-04-24 NOTE — Telephone Encounter (Signed)
   The MRI shows chronic degenerative and postoperative changes. There is no definite new change ; no acute vertebral fracture present.It would be best for a back specialist to evaluate her with these MRI findings to help define optimal treatment.

## 2014-04-24 NOTE — Telephone Encounter (Signed)
Pt is requesting the results of her MRI.  She is going out of town, and wants the results before she leaves.

## 2014-04-25 NOTE — Telephone Encounter (Signed)
Pt contacted. After reviewing chart, pt has an rx and recvd on 04/14/14 OV. Pt stated it can not be filled (per insurance) til the 7th of May. Advised her to Aleve or Ibuprofen to help with inflammation and also stated that if the pain is unbearable that she may need to see an ER MD.  [facility] ([facility] [address] stated she has recvd the following refill of Hydrocodone 4.7.15, 3.7.15, 2.8.15. On 1.31.15 she had the Norcal 5-325 qty of 10 filled and on 1.1.15 the hydrocodone was filled. Before that is was 10.29.14.

## 2014-04-25 NOTE — Telephone Encounter (Signed)
Spoke with pt about MRI findings. She request some additional pain medication to help while she is moving to [place].

## 2014-04-25 NOTE — Telephone Encounter (Signed)
#  30 until Dr Yetta BarreJones back

## 2014-04-28 ENCOUNTER — Ambulatory Visit: Payer: Medicare Other | Admitting: Internal Medicine

## 2014-05-08 ENCOUNTER — Ambulatory Visit (INDEPENDENT_AMBULATORY_CARE_PROVIDER_SITE_OTHER): Payer: Medicare Other | Admitting: Internal Medicine

## 2014-05-08 ENCOUNTER — Telehealth: Payer: Self-pay | Admitting: Internal Medicine

## 2014-05-08 ENCOUNTER — Encounter: Payer: Self-pay | Admitting: Internal Medicine

## 2014-05-08 ENCOUNTER — Ambulatory Visit (INDEPENDENT_AMBULATORY_CARE_PROVIDER_SITE_OTHER)
Admission: RE | Admit: 2014-05-08 | Discharge: 2014-05-08 | Disposition: A | Discharge status: Home / Self Care | Payer: Medicare Other | Source: Ambulatory Visit | Attending: Internal Medicine | Admitting: Internal Medicine

## 2014-05-08 VITALS — BP 130/72 | HR 84 | Temp 98.0°F | Resp 16 | Ht 65.5 in | Wt 131.5 lb

## 2014-05-08 DIAGNOSIS — F3289 Other specified depressive episodes: Secondary | ICD-10-CM

## 2014-05-08 DIAGNOSIS — S32009A Unspecified fracture of unspecified lumbar vertebra, initial encounter for closed fracture: Secondary | ICD-10-CM

## 2014-05-08 DIAGNOSIS — S298XXA Other specified injuries of thorax, initial encounter: Secondary | ICD-10-CM

## 2014-05-08 DIAGNOSIS — S299XXA Unspecified injury of thorax, initial encounter: Secondary | ICD-10-CM | POA: Insufficient documentation

## 2014-05-08 DIAGNOSIS — M545 Low back pain, unspecified: Secondary | ICD-10-CM

## 2014-05-08 DIAGNOSIS — F329 Major depressive disorder, single episode, unspecified: Secondary | ICD-10-CM

## 2014-05-08 MED ORDER — ZOLPIDEM TARTRATE 10 MG PO TABS: ORAL_TABLET | ORAL | Status: DC

## 2014-05-08 NOTE — Telephone Encounter (Signed)
LVMM about the xray results.

## 2014-05-08 NOTE — Assessment & Plan Note (Signed)
The anatomy in her lower back is complicated I have asked her to see ortho about this

## 2014-05-08 NOTE — Assessment & Plan Note (Signed)
The CXR shows a small rib fracture over the left 6th rib - this appears to be healing well with no complications She will cont the current meds for pain relief

## 2014-05-08 NOTE — Progress Notes (Signed)
Pre visit review using our clinic review tool, if applicable. No additional management support is needed unless otherwise documented below in the visit note. 

## 2014-05-08 NOTE — Patient Instructions (Signed)

## 2014-05-08 NOTE — Assessment & Plan Note (Signed)
Ortho referral  

## 2014-05-08 NOTE — Progress Notes (Signed)
   Subjective:    Patient ID: Sherri Weber, female    DOB: [birth year], 80 y.o.   MRN: [id number]  HPI Comments: She fell a week ago and inured her left rib cage area and complains of persistent pain in the area. Her low back persists without change.     Review of Systems  Constitutional: Negative.  Negative for fever, chills, diaphoresis, appetite change and fatigue.  HENT: Negative.   Eyes: Negative.   Respiratory: Negative.  Negative for apnea, cough, choking, chest tightness, shortness of breath, wheezing and stridor.   Cardiovascular: Positive for chest pain. Negative for palpitations and leg swelling.  Gastrointestinal: Negative.  Negative for nausea, vomiting, abdominal pain, diarrhea, constipation and blood in stool.  Endocrine: Negative.   Genitourinary: Negative.   Musculoskeletal: Positive for back pain. Negative for arthralgias, gait problem, joint swelling, myalgias, neck pain and neck stiffness.  Skin: Negative.   Allergic/Immunologic: Negative.   Neurological: Negative.   Hematological: Negative.  Negative for adenopathy. Does not bruise/bleed easily.  Psychiatric/Behavioral: Negative.        Objective:   Physical Exam  Vitals reviewed. Constitutional: She is oriented to person, place, and time. She appears well-developed and well-nourished.  Non-toxic appearance. She does not have a sickly appearance. She does not appear ill. No distress.  HENT:  Head: Normocephalic and atraumatic.  Mouth/Throat: Oropharynx is clear and moist. No oropharyngeal exudate.  Eyes: Conjunctivae are normal. Right eye exhibits no discharge. Left eye exhibits no discharge. No scleral icterus.  Neck: Normal range of motion. Neck supple. No JVD present. No tracheal deviation present. No thyromegaly present.  Cardiovascular: Normal rate, normal heart sounds and intact distal pulses.  Exam reveals no gallop and no friction rub.   No murmur heard. Pulmonary/Chest: Effort normal and breath  sounds normal. No accessory muscle usage or stridor. Not tachypneic. No respiratory distress. She has no decreased breath sounds. She has no wheezes. She has no rhonchi. She has no rales. She exhibits tenderness and deformity. She exhibits no crepitus, no edema, no swelling and no retraction.    Abdominal: Soft. Bowel sounds are normal. She exhibits no distension and no mass. There is no tenderness. There is no rebound and no guarding.  Musculoskeletal: Normal range of motion. She exhibits no edema and no tenderness.       Lumbar back: Normal. She exhibits normal range of motion, no tenderness, no bony tenderness, no swelling, no edema, no deformity, no laceration, no pain, no spasm and normal pulse.  Lymphadenopathy:    She has no cervical adenopathy.  Neurological: She is oriented to person, place, and time.  Skin: Skin is warm and dry. No rash noted. She is not diaphoretic. No erythema. No pallor.     Lab Results  Component Value Date   WBC 5.8 07/30/2013   HGB 12.5 07/30/2013   HCT 37.2 07/30/2013   PLT 262.0 07/30/2013   GLUCOSE 86 07/30/2013   CHOL 218* 02/24/2014   TRIG 324.0* 02/24/2014   HDL 30.90* 02/24/2014   LDLDIRECT 60.2 08/30/2013   LDLCALC 122* 02/24/2014   ALT 10 07/30/2013   AST 17 07/30/2013   NA 140 07/30/2013   K 4.6 07/30/2013   CL 107 07/30/2013   CREATININE 0.9 07/30/2013   BUN 13 07/30/2013   CO2 28 07/30/2013   TSH 4.53 07/30/2013   INR 0.95 04/28/2013       Assessment & Plan:

## 2014-05-09 ENCOUNTER — Ambulatory Visit: Payer: Medicare Other | Admitting: Internal Medicine

## 2014-05-14 IMAGING — CR DG HIP (WITH OR WITHOUT PELVIS) 2-3V*L*
3 series · 3 of 3 positions shown · non-contrast
Comparison: 06/05/2012

CLINICAL DATA: Fall, left growing pain, hip pain.

LEFT HIP - COMPLETE 2+ VIEW

[w hip lat left]
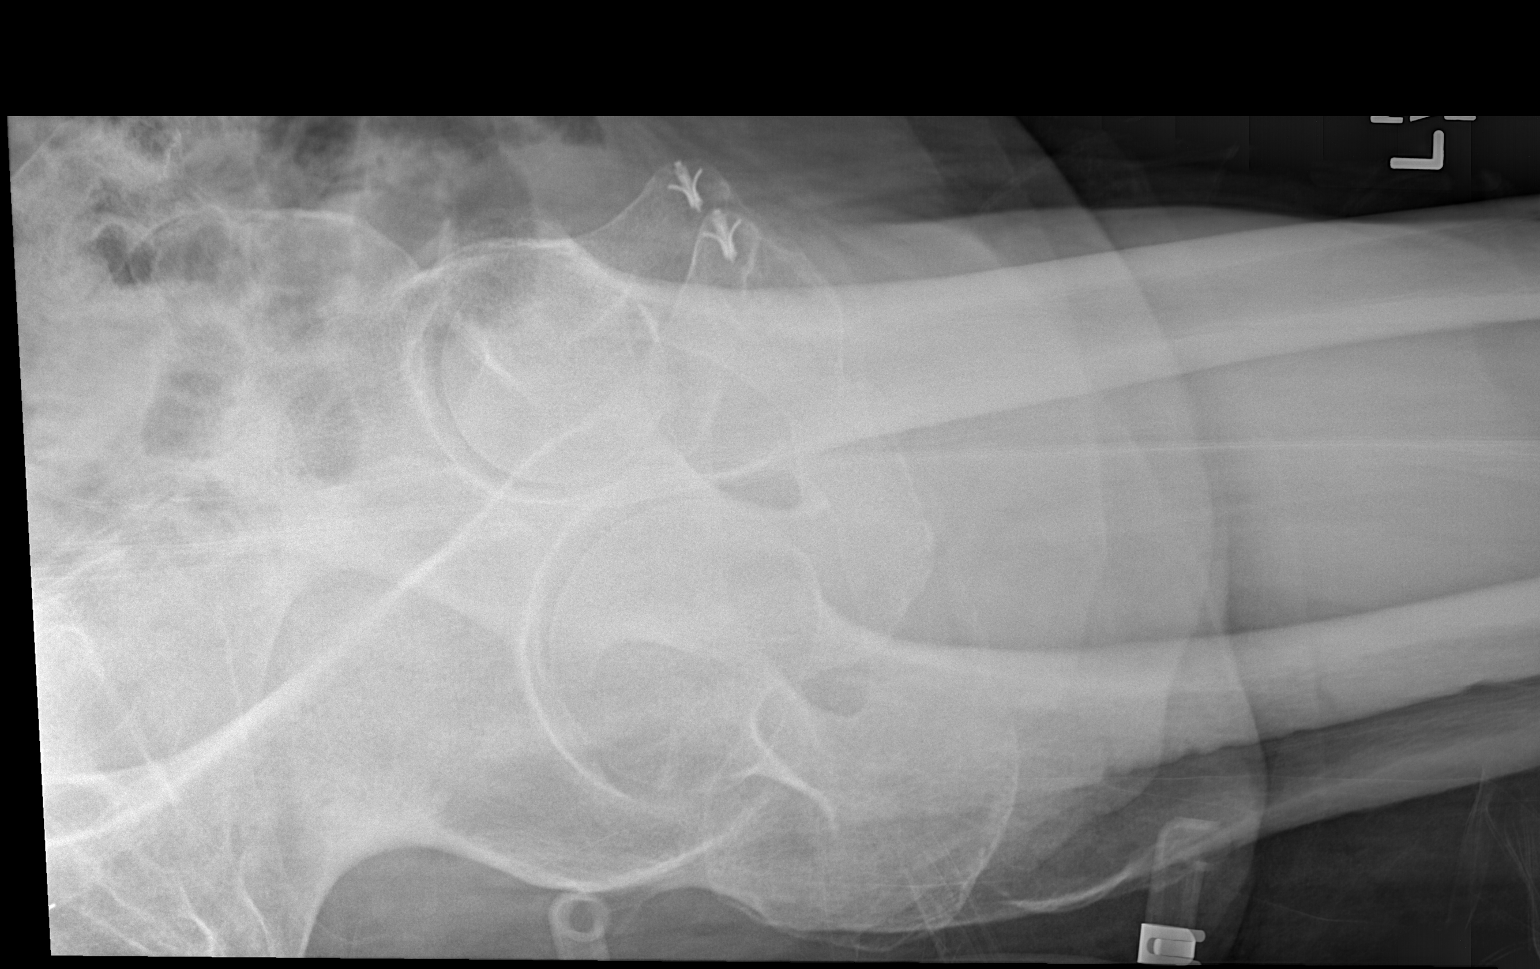

[x pelvis]
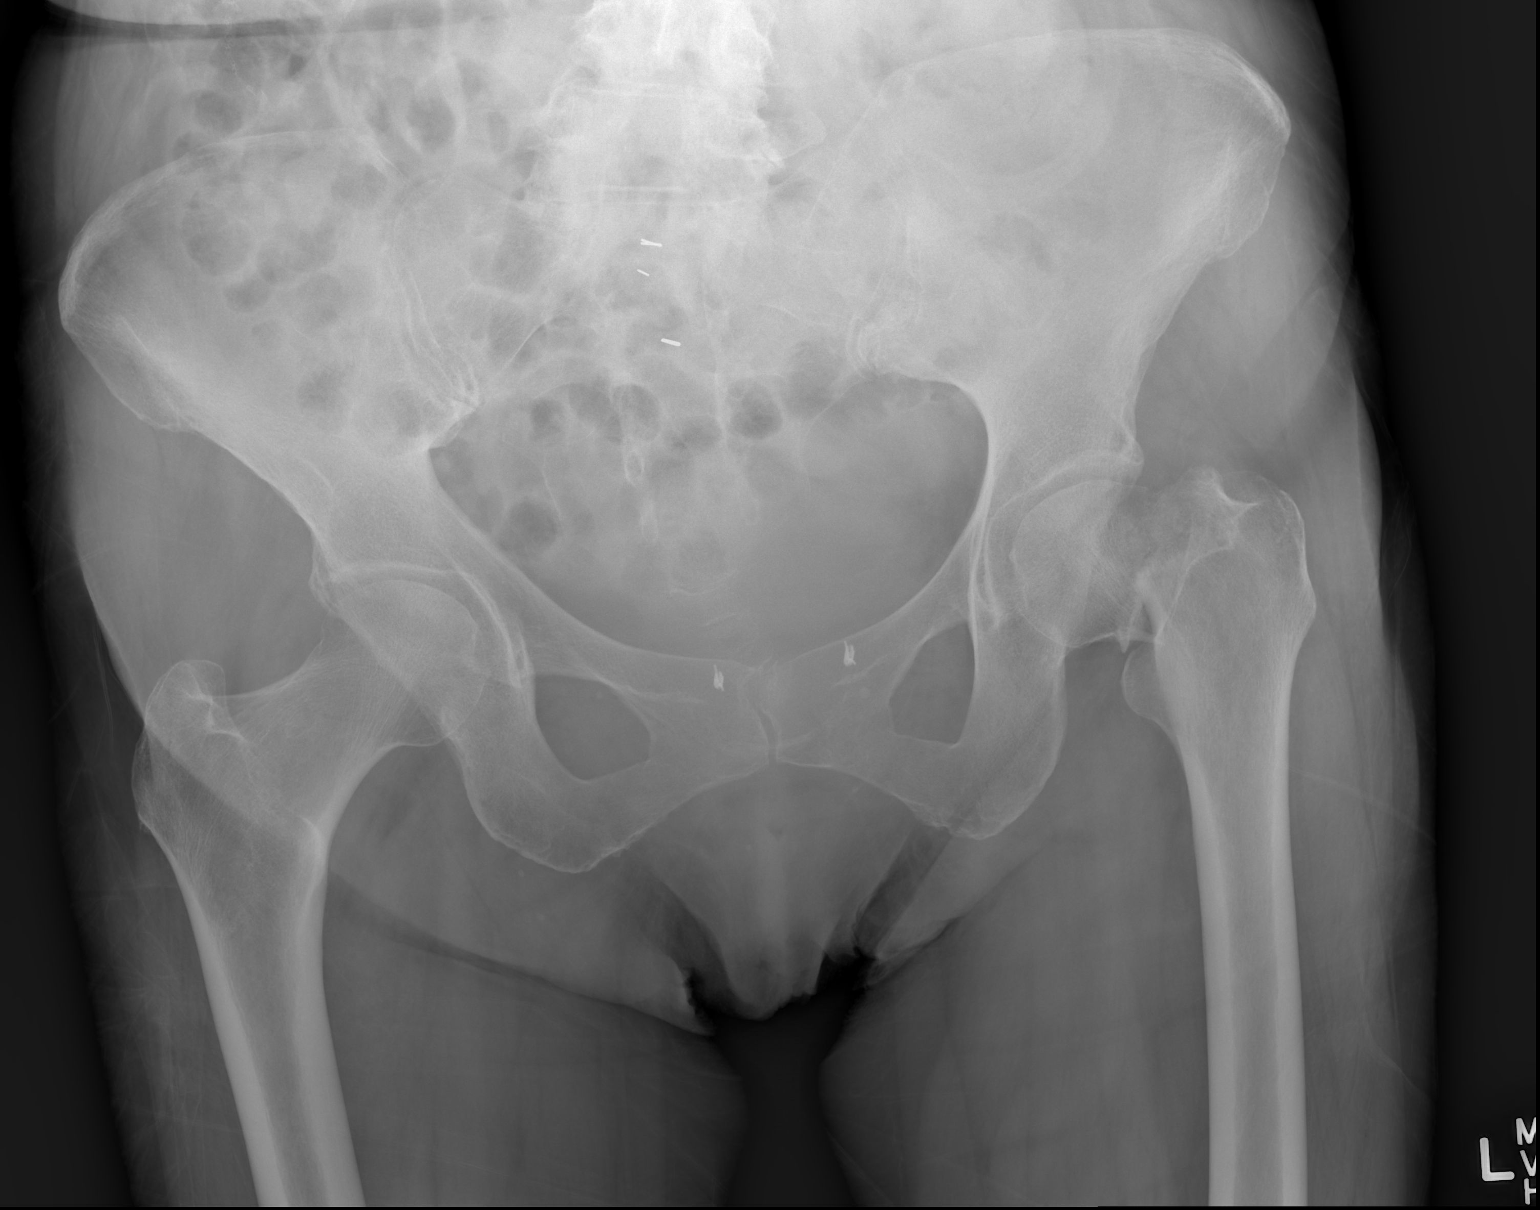

[x hip ap left]
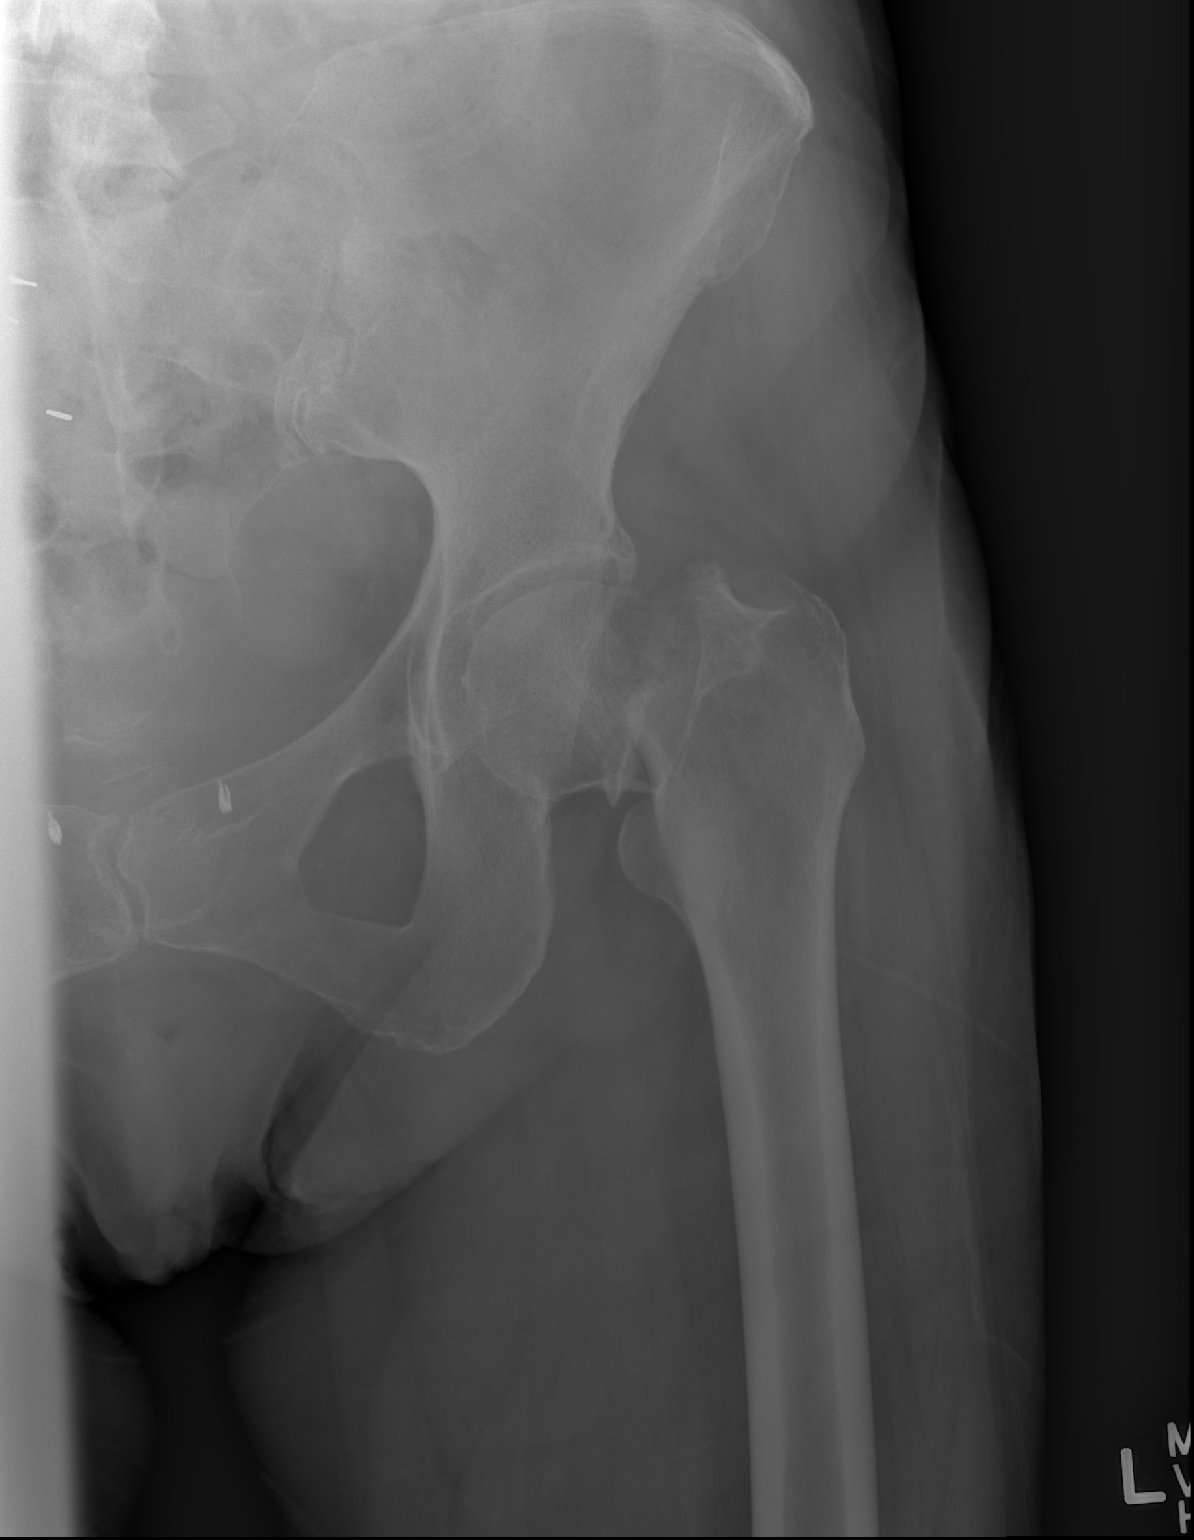

[3 of 3 positions shown; findings below may reference images not displayed]

FINDINGS: There is a left femoral neck fracture with varus
angulation and impaction.  No subluxation or dislocation.  Early
degenerative changes in the hips bilaterally.  SI joints are
symmetric and unremarkable.
IMPRESSION: Left femoral neck fracture with impaction and varus angulation.

## 2014-05-15 ENCOUNTER — Ambulatory Visit: Payer: Medicare Other | Admitting: Internal Medicine

## 2014-05-15 DIAGNOSIS — Z0289 Encounter for other administrative examinations: Secondary | ICD-10-CM

## 2014-05-21 ENCOUNTER — Telehealth: Payer: Self-pay

## 2014-05-21 NOTE — Telephone Encounter (Signed)
Patient called [facility] requesting refill on her xanax. Thanks

## 2014-05-21 NOTE — Telephone Encounter (Signed)
Patient notified of denial per MD//[facility]

## 2014-05-21 NOTE — Telephone Encounter (Signed)
She got plenty of this in March, a refill is not due at this time

## 2014-05-23 ENCOUNTER — Ambulatory Visit: Payer: Medicare Other | Admitting: Internal Medicine

## 2014-05-27 ENCOUNTER — Ambulatory Visit: Payer: Medicare Other | Admitting: Internal Medicine

## 2014-05-29 ENCOUNTER — Non-Acute Institutional Stay (SKILLED_NURSING_FACILITY): Payer: Medicare Other | Admitting: Internal Medicine

## 2014-05-29 ENCOUNTER — Encounter: Payer: Self-pay | Admitting: Internal Medicine

## 2014-05-29 DIAGNOSIS — G4700 Insomnia, unspecified: Secondary | ICD-10-CM

## 2014-05-29 DIAGNOSIS — R339 Retention of urine, unspecified: Secondary | ICD-10-CM | POA: Insufficient documentation

## 2014-05-29 DIAGNOSIS — R5381 Other malaise: Secondary | ICD-10-CM

## 2014-05-29 DIAGNOSIS — F3289 Other specified depressive episodes: Secondary | ICD-10-CM

## 2014-05-29 DIAGNOSIS — K5900 Constipation, unspecified: Secondary | ICD-10-CM | POA: Insufficient documentation

## 2014-05-29 DIAGNOSIS — F32A Depression, unspecified: Secondary | ICD-10-CM

## 2014-05-29 DIAGNOSIS — R531 Weakness: Secondary | ICD-10-CM | POA: Insufficient documentation

## 2014-05-29 DIAGNOSIS — F329 Major depressive disorder, single episode, unspecified: Secondary | ICD-10-CM

## 2014-05-29 DIAGNOSIS — F411 Generalized anxiety disorder: Secondary | ICD-10-CM | POA: Insufficient documentation

## 2014-05-29 DIAGNOSIS — M1990 Unspecified osteoarthritis, unspecified site: Secondary | ICD-10-CM

## 2014-05-29 DIAGNOSIS — R5383 Other fatigue: Secondary | ICD-10-CM

## 2014-05-29 DIAGNOSIS — N3000 Acute cystitis without hematuria: Secondary | ICD-10-CM

## 2014-05-29 NOTE — Progress Notes (Signed)
Patient ID: Sherri Weber, female   DOB: 1933/10/08, 80 y.o.   MRN: [id number]     [facility] [place]   PCP: Sanda Lingerhomas Jones, MD  Code Status: full code  No Known Allergies  Chief Complaint: new admission  HPI:  80 y/o female patient is here for STR after hospital admission from 05/25/14- 05/28/14 with acute encephalopathy in setting of her UTI and xanax overdosing. She has hx of depression and anxiety, urinary retention with self intermittent catheterization and is s/p colostomy. She is on her antibiotics. She is in no distress today. She is alert and oriented and mentions that her strength is slowly coming back. No concern from patient or staff this visit.  Review of Systems:  Constitutional: Negative for fever, chills, diaphoresis.  HENT: Negative for congestion, hearing loss and sore throat.   Eyes: Negative for eye pain, blurred vision, double vision and discharge.  Respiratory: Negative for cough, sputum production, shortness of breath and wheezing.   Cardiovascular: Negative for chest pain, palpitations, orthopnea and leg swelling.  Gastrointestinal: Negative for heartburn, nausea, vomiting, abdominal pain Genitourinary: chronic urinary retention  Musculoskeletal: Negative for back pain, falls, joint pain and myalgias.  Skin: Negative for itching and rash.  Neurological: Negative for dizziness, tingling, focal weakness and headaches.     Past Medical History  Diagnosis Date  . Depressive disorder, not elsewhere classified   . Diarrhea   . Other malaise and fatigue   . Abdominal distension   . Hemorrhoids   . Chronic constipation   . Family history of malignant neoplasm of gastrointestinal tract   . Rectal prolapse   . Ulcer of anus and rectum   . Irritable bowel syndrome   . Low back pain   . Osteoarthritis    Past Surgical History  Procedure Laterality Date  . Abdominal hysterectomy    . Cholecystectomy    . Appendectomy    . Hemicolectomy    . Cecectomy     . Colon surgery    . Colostomy    . Hip arthroplasty Left 04/29/2013    Procedure: ARTHROPLASTY BIPOLAR HIP;  Surgeon: Shelda PalMatthew D Olin, MD;  Location: [facility];  Service: Orthopedics;  Laterality: Left;   Social History:   reports that she has never smoked. She has never used smokeless tobacco. She reports that she does not drink alcohol or use illicit drugs.  Family History  Problem Relation Age of Onset  . Diabetes Sister   . Stroke Sister   . Mental retardation Maternal Uncle   . Emphysema Mother   . Stroke Father   . Cancer Sister     Colon    Medications: Patient's Medications  New Prescriptions   No medications on file  Previous Medications   ALPRAZOLAM (XANAX) 0.5 MG TABLET    Take 0.5 mg by mouth 3 (three) times daily as needed for anxiety.    CEFDINIR (OMNICEF) 300 MG CAPSULE    Take 300 mg by mouth 2 (two) times daily.   HYDROCODONE-ACETAMINOPHEN (NORCO) 7.5-325 MG PER TABLET    Take 1 tablet by mouth every 6 (six) hours as needed for moderate pain.   POLYETHYLENE GLYCOL (MIRALAX / GLYCOLAX) PACKET    Take 17 g by mouth daily as needed (for constipation).   ZOLPIDEM (AMBIEN) 10 MG TABLET    TAKE ONE TABLET BY MOUTH AT BEDTIME AS NEEDED FOR SLEEP  Modified Medications   Modified Medication Previous Medication   TRAZODONE (DESYREL) 50 MG TABLET traZODone (DESYREL) 50  MG tablet      one tablet daily at night as needed for sleep    TAKE ONE-HALF TO ONE TABLET BY MOUTH AT BEDTIME  Discontinued Medications   ALPRAZOLAM (XANAX) 2 MG TABLET    TAKE ONE TABLET BY MOUTH THREE TIMES DAILY AS NEEDED FOR ANXIETY   BUPROPION (WELLBUTRIN XL) 150 MG 24 HR TABLET    Take 1 tablet (150 mg total) by mouth daily.   HYDROCODONE-ACETAMINOPHEN 7.5-300 MG TABS    Take 1 tablet by mouth 3 (three) times daily as needed.   HYDROCORTISONE (ANUSOL-HC) 2.5 % RECTAL CREAM    Place rectally 2 (two) times daily.   LACTOSE FREE NUTRITION (BOOST) LIQD    Take 237 mLs by mouth 3 (three) times daily between  meals.   OMEPRAZOLE (PRILOSEC) 40 MG CAPSULE    Take 1 capsule (40 mg total) by mouth daily.   OXYBUTYNIN (DITROPAN-XL) 10 MG 24 HR TABLET    Take 1 tablet (10 mg total) by mouth every evening.     Physical Exam:  Filed Vitals:   05/29/14 1151  BP: 150/87  Pulse: 72  Temp: 97.7 F (36.5 C)  Resp: 18  Height: 5' (1.524 m)  Weight: 120 lb (54.432 kg)    General- elderly female in no acute distress, frail Head- atraumatic, normocephalic Eyes- PERRLA, EOMI, no pallor, no icterus, no discharge Neck- no lymphadenopathy Throat- moist mucus membrane, normal oropharynx Cardiovascular- normal s1,s2, no murmurs Respiratory- bilateral clear to auscultation, no wheeze, no rhonchi, no crackles, no use of accessory muscles Abdomen- bowel sounds present, soft, non tender, ostomy bag in place and site clean, no suprapubic tenderness Musculoskeletal- able to move all 4 extremities, no leg edema, generalized weakness Neurological- no focal deficit Skin- warm and dry Psychiatry- alert and oriented to person, place and time, normal mood and affect, calm this visit   Labs reviewed: Basic Metabolic Panel:  Recent Labs  09/32/67 1235 07/01/13 1605 07/30/13 1118  NA 139 139 140  K 4.2 4.0 4.6  CL 105 105 107  CO2 25 29 28   GLUCOSE 111* 103* 86  BUN 12 12 13   CREATININE 0.72 0.77 0.9  CALCIUM 8.8 9.2 8.6   Liver Function Tests:  Recent Labs  07/01/13 1605 07/30/13 1118  AST 13 17  ALT 6 10  ALKPHOS 77 70  BILITOT 0.3 0.4  PROT 6.6 6.3  ALBUMIN 3.1* 3.4*   No results found for this basename: LIPASE, AMYLASE,  in the last 8760 hours No results found for this basename: AMMONIA,  in the last 8760 hours CBC:  Recent Labs  06/06/13 1235 07/01/13 1605 07/30/13 1118  WBC 6.1 4.7 5.8  NEUTROABS 4.3 3.1 4.1  HGB 12.0 12.0 12.5  HCT 35.3* 35.8* 37.2  MCV 89.8 89.7 89.8  PLT 244 242 262.0   05/28/14 wbc 9.7, hb 14.1, hct 39.5, plt 288 05/26/14 na 141, k 3.9, bun 21, cr 0.67, ca  8.5, lft wnl 05/25/14 tsh 4.49, a1c 5.2  Assessment/Plan  Generalized weakness Recent infection likely contributing to this, complete current antibiotic course. Will have him work with physical therapy and occupational therapy team to help with gait training and muscle strengthening exercises.fall precautions. Skin care. Encourage to be out of bed.   Acute cystitis Complete course of omnicef 300 mg bid on 06/02/14. Monitor clinically. Encouraged hydration. Check bmp  Anxiety Calm this visit. Continue xanax 0.5 mg q8h prn for anxiety  Constipation Continue miralax for now  Insomnia Has not  been taking trazodone. Will discontinue this. Change ambien to 10 mg daily  Depression Started on lexapro 5 mg daily for a week and then up to 10 mg daily. Monitor mood  Arthritis Continue prn hydrocodone- apap 7.5-325 mg q6h prn  Urinary retention Continue self catheterization. Monitor clinically  Family/ staff Communication: reviewed care plan with patient and nursing supervisor   Goals of care: short term rehabilitation   Labs/tests ordered: BMP in 1 week    Oneal Grout, MD  [facility] [phone] (Monday-Friday 8 am - 5 pm) [phone] (afterhours)

## 2014-06-05 ENCOUNTER — Non-Acute Institutional Stay (SKILLED_NURSING_FACILITY): Payer: Medicare Other | Admitting: Internal Medicine

## 2014-06-05 DIAGNOSIS — R5381 Other malaise: Secondary | ICD-10-CM

## 2014-06-05 DIAGNOSIS — F32A Depression, unspecified: Secondary | ICD-10-CM

## 2014-06-05 DIAGNOSIS — F411 Generalized anxiety disorder: Secondary | ICD-10-CM

## 2014-06-05 DIAGNOSIS — G4700 Insomnia, unspecified: Secondary | ICD-10-CM

## 2014-06-05 DIAGNOSIS — R5383 Other fatigue: Secondary | ICD-10-CM

## 2014-06-05 DIAGNOSIS — R531 Weakness: Secondary | ICD-10-CM | POA: Insufficient documentation

## 2014-06-05 DIAGNOSIS — F329 Major depressive disorder, single episode, unspecified: Secondary | ICD-10-CM

## 2014-06-05 DIAGNOSIS — F3289 Other specified depressive episodes: Secondary | ICD-10-CM

## 2014-06-05 DIAGNOSIS — R339 Retention of urine, unspecified: Secondary | ICD-10-CM

## 2014-06-05 NOTE — Progress Notes (Signed)
Patient ID: Sherri Weber, female   DOB: Nov 27, 1933, 80 y.o.   MRN: [id number]    [facility]  Chief Complaint  Patient presents with  . Discharge Note    discharge   No Known Allergies  Code Status: full code  No Known Allergies  HPI:   80 y/o female patient is here for STR after hospital admission with acute encephalopathy in setting of her UTI and xanax overdosing. She has hx of depression and anxiety, urinary retention with self intermittent catheterization and is s/p colostomy. She has completed her antibiotics. She has worked well with therapy team and is now being discharged home. She is seen in her room today. She is in no distress today. She is alert and oriented   Review of Systems:  Constitutional: Negative for fever, chills, diaphoresis.  HENT: Negative for congestion, hearing loss and sore throat.   Eyes: Negative for eye pain, blurred vision, double vision and discharge.  Respiratory: Negative for cough, sputum production, shortness of breath and wheezing.   Cardiovascular: Negative for chest pain, palpitations, orthopnea and leg swelling.  Gastrointestinal: Negative for heartburn, nausea, vomiting, abdominal pain Genitourinary: chronic urinary retention, self cath. She would like to know if she can resumed her home regimen oxybutynin Musculoskeletal: Negative for back pain, falls, joint pain and myalgias.  Skin: Negative for itching and rash.  Neurological: Negative for dizziness, tingling, focal weakness and headaches.    Past Medical History  Diagnosis Date  . Depressive disorder, not elsewhere classified   . Diarrhea   . Other malaise and fatigue   . Abdominal distension   . Hemorrhoids   . Chronic constipation   . Family history of malignant neoplasm of gastrointestinal tract   . Rectal prolapse   . Ulcer of anus and rectum   . Irritable bowel syndrome   . Low back pain   . Osteoarthritis    Current Outpatient  Prescriptions on File Prior to Visit  Medication Sig Dispense Refill  . ALPRAZolam (XANAX) 0.5 MG tablet Take 0.5 mg by mouth 3 (three) times daily as needed for anxiety.       Marland Kitchen. HYDROcodone-acetaminophen (NORCO) 7.5-325 MG per tablet Take 1 tablet by mouth every 6 (six) hours as needed for moderate pain.      . polyethylene glycol (MIRALAX / GLYCOLAX) packet Take 17 g by mouth daily as needed (for constipation).       No current facility-administered medications on file prior to visit.   Physical exam Vital signs stable, afebrile  General- elderly female in no acute distress, frail Head- atraumatic, normocephalic Eyes- PERRLA, EOMI, no pallor, no icterus, no discharge Neck- no lymphadenopathy Throat- moist mucus membrane, normal oropharynx Cardiovascular- normal s1,s2, no murmurs Respiratory- bilateral clear to auscultation, no wheeze, no rhonchi, no crackles, no use of accessory muscles Abdomen- bowel sounds present, soft, non tender, ostomy bag in place and site clean, no suprapubic tenderness Musculoskeletal- able to move all 4 extremities, no leg edema Neurological- no focal deficit Skin- warm and dry Psychiatry- alert and oriented to person, place and time, normal mood and affect, calm this visit  Assessment/plan  80 y/o female patient is here for rehabilitation and is now stable to be discharged home with home health services with PT and OT services.  She can resume her oxybutynin  She will need follow up with PCP  She has a walker at home  Script for her medication with 30 days supply provided  Script on her  pain and anxiety medication provided for 10 days

## 2014-06-20 ENCOUNTER — Encounter: Payer: Self-pay | Admitting: Internal Medicine

## 2014-06-20 ENCOUNTER — Ambulatory Visit (INDEPENDENT_AMBULATORY_CARE_PROVIDER_SITE_OTHER): Payer: Medicare Other | Admitting: Internal Medicine

## 2014-06-20 VITALS — BP 118/70 | HR 85 | Temp 98.3°F | Resp 16 | Ht 66.5 in | Wt 133.8 lb

## 2014-06-20 DIAGNOSIS — S32009D Unspecified fracture of unspecified lumbar vertebra, subsequent encounter for fracture with routine healing: Secondary | ICD-10-CM

## 2014-06-20 DIAGNOSIS — I872 Venous insufficiency (chronic) (peripheral): Secondary | ICD-10-CM

## 2014-06-20 DIAGNOSIS — M545 Low back pain, unspecified: Secondary | ICD-10-CM

## 2014-06-20 DIAGNOSIS — IMO0002 Reserved for concepts with insufficient information to code with codable children: Secondary | ICD-10-CM

## 2014-06-20 DIAGNOSIS — F411 Generalized anxiety disorder: Secondary | ICD-10-CM

## 2014-06-20 DIAGNOSIS — R609 Edema, unspecified: Secondary | ICD-10-CM | POA: Insufficient documentation

## 2014-06-20 MED ORDER — HYDROCODONE-ACETAMINOPHEN 7.5-325 MG PO TABS: 1.0000 | ORAL_TABLET | Freq: Four times a day (QID) | ORAL | Status: DC | PRN

## 2014-06-20 MED ORDER — FUROSEMIDE 20 MG PO TABS: 20.0000 mg | ORAL_TABLET | Freq: Every day | ORAL | Status: DC

## 2014-06-20 MED ORDER — ALPRAZOLAM 0.5 MG PO TABS: 0.5000 mg | ORAL_TABLET | Freq: Two times a day (BID) | ORAL | Status: DC

## 2014-06-20 NOTE — Progress Notes (Signed)
   Subjective:    Patient ID: Sherri Weber, female    DOB: Mar 13, 1933, 80 y.o.   MRN: [id number]  HPI Comments: She returns and complains of persistent edema. She was recently admitted for a UTI and possible accidental OD on xanax. She is doing better now on lower dose of xanax.     Review of Systems  Constitutional: Positive for fatigue. Negative for fever, chills, diaphoresis, activity change, appetite change and unexpected weight change.  HENT: Negative.   Eyes: Negative.   Respiratory: Negative.  Negative for apnea, cough, choking, chest tightness, shortness of breath, wheezing and stridor.   Cardiovascular: Negative.  Negative for chest pain, palpitations and leg swelling.  Gastrointestinal: Negative.  Negative for nausea, vomiting, abdominal pain, diarrhea, constipation and blood in stool.  Endocrine: Negative.   Genitourinary: Negative.   Musculoskeletal: Positive for arthralgias and back pain. Negative for gait problem, joint swelling, myalgias, neck pain and neck stiffness.  Skin: Negative.   Allergic/Immunologic: Negative.   Neurological: Negative.  Negative for dizziness, tremors, seizures, syncope, facial asymmetry, speech difficulty, weakness, light-headedness, numbness and headaches.  Hematological: Negative.  Negative for adenopathy. Does not bruise/bleed easily.  Psychiatric/Behavioral: Positive for sleep disturbance and decreased concentration. Negative for suicidal ideas, hallucinations, behavioral problems, confusion, self-injury, dysphoric mood and agitation. The patient is nervous/anxious. The patient is not hyperactive.        Objective:   Physical Exam  Vitals reviewed. Constitutional: She is oriented to person, place, and time. She appears well-developed and well-nourished. No distress.  HENT:  Head: Normocephalic and atraumatic.  Mouth/Throat: Oropharynx is clear and moist. No oropharyngeal exudate.  Eyes: Conjunctivae are normal. Right eye exhibits no  discharge. Left eye exhibits no discharge. No scleral icterus.  Neck: Normal range of motion. Neck supple. No JVD present. No tracheal deviation present. No thyromegaly present.  Cardiovascular: Normal rate, regular rhythm, normal heart sounds and intact distal pulses.  Exam reveals no gallop and no friction rub.   No murmur heard. Pulmonary/Chest: Effort normal and breath sounds normal. No stridor. No respiratory distress. She has no wheezes. She has no rales. She exhibits no tenderness.  Abdominal: Soft. Bowel sounds are normal. She exhibits no distension and no mass. There is no tenderness. There is no rebound and no guarding.  Musculoskeletal: Normal range of motion. She exhibits edema (3+ pitting edema in BLE). She exhibits no tenderness.  Lymphadenopathy:    She has no cervical adenopathy.  Neurological: She is oriented to person, place, and time.  Skin: Skin is warm and dry. No rash noted. She is not diaphoretic. No erythema. No pallor.  Psychiatric: She has a normal mood and affect. Judgment and thought content normal. Her mood appears not anxious. Her affect is not angry, not blunt, not labile and not inappropriate. Her speech is delayed and tangential. She is not agitated, not aggressive, not hyperactive, not slowed, not withdrawn, not actively hallucinating and not combative. Cognition and memory are normal. Cognition and memory are not impaired. She does not exhibit a depressed mood. She exhibits normal recent memory and normal remote memory. She is inattentive.          Assessment & Plan:

## 2014-06-20 NOTE — Assessment & Plan Note (Signed)
She may benefit from compression hose - vascular surgery referral to address this Will start lasix to reduce the edema

## 2014-06-20 NOTE — Assessment & Plan Note (Signed)
She will cont the current meds for pain 

## 2014-06-20 NOTE — Progress Notes (Signed)
Pre visit review using our clinic review tool, if applicable. No additional management support is needed unless otherwise documented below in the visit note. 

## 2014-06-20 NOTE — Assessment & Plan Note (Addendum)
She has a recent admission for AMS that may have been caused by the xanax so I have started to taper her dose from 2 mg TID to 0.5 mg BID I have asked her to stop taking [place]

## 2014-06-20 NOTE — Patient Instructions (Signed)

## 2014-06-20 NOTE — Assessment & Plan Note (Signed)
Will start lasix to help with this I have asked her to see vascular for further evaluation

## 2014-07-24 ENCOUNTER — Encounter: Payer: Self-pay | Admitting: Internal Medicine

## 2014-07-24 ENCOUNTER — Other Ambulatory Visit: Payer: Self-pay | Admitting: *Deleted

## 2014-07-24 ENCOUNTER — Ambulatory Visit (INDEPENDENT_AMBULATORY_CARE_PROVIDER_SITE_OTHER): Payer: Medicare Other | Admitting: Internal Medicine

## 2014-07-24 VITALS — BP 130/70 | HR 90 | Temp 97.7°F | Resp 16 | Ht 66.5 in | Wt 134.0 lb

## 2014-07-24 DIAGNOSIS — E669 Obesity, unspecified: Secondary | ICD-10-CM

## 2014-07-24 DIAGNOSIS — M545 Low back pain, unspecified: Secondary | ICD-10-CM

## 2014-07-24 DIAGNOSIS — R609 Edema, unspecified: Secondary | ICD-10-CM

## 2014-07-24 DIAGNOSIS — F341 Dysthymic disorder: Secondary | ICD-10-CM

## 2014-07-24 DIAGNOSIS — F418 Other specified anxiety disorders: Secondary | ICD-10-CM

## 2014-07-24 DIAGNOSIS — I872 Venous insufficiency (chronic) (peripheral): Secondary | ICD-10-CM

## 2014-07-24 MED ORDER — PHENDIMETRAZINE TARTRATE ER 105 MG PO CP24: 1.0000 | ORAL_CAPSULE | Freq: Every day | ORAL | Status: DC

## 2014-07-24 MED ORDER — VILAZODONE HCL 40 MG PO TABS: 40.0000 mg | ORAL_TABLET | Freq: Every day | ORAL | Status: DC

## 2014-07-24 MED ORDER — VILAZODONE HCL 10 & 20 & 40 MG PO KIT: 1.0000 | PACK | Freq: Every day | ORAL | Status: DC

## 2014-07-24 NOTE — Patient Instructions (Signed)

## 2014-07-24 NOTE — Progress Notes (Signed)
Subjective:    Patient ID: Sherri Weber, female    DOB: [birth year], 80 y.o.   MRN: [id number]  Hypertension This is a chronic problem. The current episode started more than 1 year ago. The problem is unchanged. The problem is controlled. Associated symptoms include peripheral edema. Pertinent negatives include no anxiety, blurred vision, chest pain, headaches, malaise/fatigue, neck pain, orthopnea, palpitations, PND, shortness of breath or sweats. Past treatments include diuretics. The current treatment provides moderate improvement. Compliance problems include psychosocial issues, exercise and diet.       Review of Systems  Constitutional: Negative.  Negative for fever, chills, malaise/fatigue, diaphoresis, appetite change and fatigue.  HENT: Negative.   Eyes: Negative.  Negative for blurred vision.  Respiratory: Negative.  Negative for cough, choking, chest tightness, shortness of breath and wheezing.   Cardiovascular: Positive for leg swelling. Negative for chest pain, palpitations, orthopnea and PND.  Gastrointestinal: Negative.  Negative for nausea, vomiting, abdominal pain, diarrhea, constipation and blood in stool.  Endocrine: Negative.   Genitourinary: Negative.   Musculoskeletal: Positive for arthralgias and back pain. Negative for neck pain.  Skin: Negative.  Negative for rash.  Allergic/Immunologic: Negative.   Neurological: Negative.  Negative for dizziness, syncope, speech difficulty, weakness, light-headedness, numbness and headaches.  Hematological: Negative.  Negative for adenopathy. Does not bruise/bleed easily.  Psychiatric/Behavioral: Positive for sleep disturbance and dysphoric mood. Negative for suicidal ideas, hallucinations, behavioral problems, confusion, self-injury, decreased concentration and agitation. The patient is nervous/anxious. The patient is not hyperactive.        Objective:   Physical Exam  Vitals reviewed. Constitutional: She is oriented to  person, place, and time. She appears well-developed and well-nourished. No distress.  HENT:  Head: Normocephalic and atraumatic.  Mouth/Throat: Oropharynx is clear and moist. No oropharyngeal exudate.  Eyes: Conjunctivae are normal. Right eye exhibits no discharge. Left eye exhibits no discharge. No scleral icterus.  Neck: Normal range of motion. Neck supple. No JVD present. No tracheal deviation present. No thyromegaly present.  Cardiovascular: Normal rate, regular rhythm, normal heart sounds and intact distal pulses.  Exam reveals no gallop and no friction rub.   No murmur heard. Pulmonary/Chest: Effort normal and breath sounds normal. No stridor. No respiratory distress. She has no wheezes. She has no rales. She exhibits no tenderness.  Abdominal: Soft. Bowel sounds are normal. She exhibits no distension and no mass. There is no tenderness. There is no rebound and no guarding.  Musculoskeletal: Normal range of motion. She exhibits edema (2+ pitting edema in BLE). She exhibits no tenderness.  Lymphadenopathy:    She has no cervical adenopathy.  Neurological: She is oriented to person, place, and time.  Skin: Skin is warm and dry. No rash noted. She is not diaphoretic. No erythema. No pallor.  Psychiatric: Her speech is normal and behavior is normal. Judgment and thought content normal. Her mood appears not anxious. Her affect is not angry, not blunt, not labile and not inappropriate. Cognition and memory are normal. She exhibits a depressed mood. She expresses no homicidal and no suicidal ideation. She expresses no suicidal plans and no homicidal plans.     Lab Results  Component Value Date   WBC 5.8 07/30/2013   HGB 12.5 07/30/2013   HCT 37.2 07/30/2013   PLT 262.0 07/30/2013   GLUCOSE 86 07/30/2013   CHOL 218* 02/24/2014   TRIG 324.0* 02/24/2014   HDL 30.90* 02/24/2014   LDLDIRECT 60.2 08/30/2013   LDLCALC 122* 02/24/2014   ALT 10 07/30/2013  AST 17 07/30/2013   NA 140 07/30/2013   K 4.6 07/30/2013   CL  107 07/30/2013   CREATININE 0.9 07/30/2013   BUN 13 07/30/2013   CO2 28 07/30/2013   TSH 4.53 07/30/2013   INR 0.95 04/28/2013       Assessment & Plan:

## 2014-07-24 NOTE — Progress Notes (Signed)
Pre visit review using our clinic review tool, if applicable. No additional management support is needed unless otherwise documented below in the visit note. 

## 2014-07-25 ENCOUNTER — Encounter: Payer: Self-pay | Admitting: Surgery

## 2014-07-25 NOTE — Assessment & Plan Note (Signed)
She does not think lexapro is helping much so I have asked her to start Viibryd

## 2014-07-25 NOTE — Assessment & Plan Note (Signed)
Cont lasix and refer to vascular to consider compression devices to help with this

## 2014-07-25 NOTE — Assessment & Plan Note (Signed)
She may need compression pumps to treat this so I have asked her to see vascular surgery for further evaluation

## 2014-07-25 NOTE — Assessment & Plan Note (Signed)
Will cont current meds for pain relief

## 2014-07-28 ENCOUNTER — Encounter (HOSPITAL_COMMUNITY): Payer: Medicare Other

## 2014-07-28 ENCOUNTER — Encounter: Payer: Medicare Other | Admitting: Surgery

## 2014-08-08 ENCOUNTER — Encounter (HOSPITAL_COMMUNITY): Payer: Medicare Other

## 2014-08-08 ENCOUNTER — Encounter: Payer: Medicare Other | Admitting: Vascular Surgery

## 2014-08-20 ENCOUNTER — Other Ambulatory Visit: Payer: Self-pay | Admitting: Internal Medicine

## 2014-09-03 ENCOUNTER — Ambulatory Visit (INDEPENDENT_AMBULATORY_CARE_PROVIDER_SITE_OTHER): Payer: Medicare Other | Admitting: Internal Medicine

## 2014-09-03 ENCOUNTER — Encounter: Payer: Self-pay | Admitting: Internal Medicine

## 2014-09-03 VITALS — BP 130/70 | HR 96 | Temp 98.4°F | Resp 16 | Ht 65.5 in | Wt 134.0 lb

## 2014-09-03 DIAGNOSIS — F418 Other specified anxiety disorders: Secondary | ICD-10-CM

## 2014-09-03 DIAGNOSIS — R609 Edema, unspecified: Secondary | ICD-10-CM

## 2014-09-03 DIAGNOSIS — F411 Generalized anxiety disorder: Secondary | ICD-10-CM

## 2014-09-03 DIAGNOSIS — Z23 Encounter for immunization: Secondary | ICD-10-CM

## 2014-09-03 DIAGNOSIS — F341 Dysthymic disorder: Secondary | ICD-10-CM

## 2014-09-03 DIAGNOSIS — M545 Low back pain, unspecified: Secondary | ICD-10-CM

## 2014-09-03 DIAGNOSIS — IMO0002 Reserved for concepts with insufficient information to code with codable children: Secondary | ICD-10-CM

## 2014-09-03 DIAGNOSIS — S32009D Unspecified fracture of unspecified lumbar vertebra, subsequent encounter for fracture with routine healing: Secondary | ICD-10-CM

## 2014-09-03 MED ORDER — HYDROCODONE-ACETAMINOPHEN 7.5-325 MG PO TABS: 1.0000 | ORAL_TABLET | Freq: Four times a day (QID) | ORAL | Status: DC | PRN

## 2014-09-03 NOTE — Progress Notes (Signed)
Pre visit review using our clinic review tool, if applicable. No additional management support is needed unless otherwise documented below in the visit note. 

## 2014-09-03 NOTE — Patient Instructions (Signed)
Back Pain, Adult Low back pain is very common. About 1 in 5 people have back pain.The cause of low back pain is rarely dangerous. The pain often gets better over time.About half of people with a sudden onset of back pain feel better in just 2 weeks. About 8 in 10 people feel better by 6 weeks.  CAUSES Some common causes of back pain include:  Strain of the muscles or ligaments supporting the spine.  Wear and tear (degeneration) of the spinal discs.  Arthritis.  Direct injury to the back. DIAGNOSIS Most of the time, the direct cause of low back pain is not known.However, back pain can be treated effectively even when the exact cause of the pain is unknown.Answering your caregiver's questions about your overall health and symptoms is one of the most accurate ways to make sure the cause of your pain is not dangerous. If your caregiver needs more information, he or she may order lab work or imaging tests (X-rays or MRIs).However, even if imaging tests show changes in your back, this usually does not require surgery. HOME CARE INSTRUCTIONS For many people, back pain returns.Since low back pain is rarely dangerous, it is often a condition that people can learn to manageon their own.   Remain active. It is stressful on the back to sit or stand in one place. Do not sit, drive, or stand in one place for more than 30 minutes at a time. Take short walks on level surfaces as soon as pain allows.Try to increase the length of time you walk each day.  Do not stay in bed.Resting more than 1 or 2 days can delay your recovery.  Do not avoid exercise or work.Your body is made to move.It is not dangerous to be active, even though your back may hurt.Your back will likely heal faster if you return to being active before your pain is gone.  Pay attention to your body when you bend and lift. Many people have less discomfortwhen lifting if they bend their knees, keep the load close to their bodies,and  avoid twisting. Often, the most comfortable positions are those that put less stress on your recovering back.  Find a comfortable position to sleep. Use a firm mattress and lie on your side with your knees slightly bent. If you lie on your back, put a pillow under your knees.  Only take over-the-counter or prescription medicines as directed by your caregiver. Over-the-counter medicines to reduce pain and inflammation are often the most helpful.Your caregiver may prescribe muscle relaxant drugs.These medicines help dull your pain so you can more quickly return to your normal activities and healthy exercise.  Put ice on the injured area.  Put ice in a plastic bag.  Place a towel between your skin and the bag.  Leave the ice on for 15-20 minutes, 03-04 times a day for the first 2 to 3 days. After that, ice and heat may be alternated to reduce pain and spasms.  Ask your caregiver about trying back exercises and gentle massage. This may be of some benefit.  Avoid feeling anxious or stressed.Stress increases muscle tension and can worsen back pain.It is important to recognize when you are anxious or stressed and learn ways to manage it.Exercise is a great option. SEEK MEDICAL CARE IF:  You have pain that is not relieved with rest or medicine.  You have pain that does not improve in 1 week.  You have new symptoms.  You are generally not feeling well. SEEK   IMMEDIATE MEDICAL CARE IF:   You have pain that radiates from your back into your legs.  You develop new bowel or bladder control problems.  You have unusual weakness or numbness in your arms or legs.  You develop nausea or vomiting.  You develop abdominal pain.  You feel faint. Document Released: 12/12/2005 Document Revised: 06/12/2012 Document Reviewed: 04/15/2014 Exit[employer] Patient Information 2015 [employer]. This information is not intended to replace advice given to you by your health care provider. Make sure you  discuss any questions you have with your health care provider.  

## 2014-09-03 NOTE — Progress Notes (Signed)
Subjective:    Patient ID: Sherri Weber, female    DOB: 1933/06/20, 80 y.o.   MRN: [id number]  Back Pain This is a chronic problem. The current episode started more than 1 year ago. The problem occurs intermittently. The problem is unchanged. The pain is present in the lumbar spine. The quality of the pain is described as aching and stabbing. The pain does not radiate. The pain is at a severity of 6/10. The pain is moderate. The pain is worse during the day. The symptoms are aggravated by bending, position and standing. Pertinent negatives include no abdominal pain, bladder incontinence, bowel incontinence, chest pain, dysuria, fever, headaches, leg pain, numbness, paresis, paresthesias, pelvic pain, perianal numbness, tingling, weakness or weight loss. She has tried analgesics for the symptoms. The treatment provided significant relief.      Review of Systems  Constitutional: Negative.  Negative for fever, chills, weight loss, diaphoresis, appetite change and fatigue.  HENT: Negative.   Eyes: Negative.   Respiratory: Negative.  Negative for cough.   Cardiovascular: Negative.  Negative for chest pain, palpitations and leg swelling.  Gastrointestinal: Positive for constipation. Negative for nausea, vomiting, abdominal pain, diarrhea and bowel incontinence.  Endocrine: Negative.   Genitourinary: Negative.  Negative for bladder incontinence, dysuria, difficulty urinating and pelvic pain.  Musculoskeletal: Positive for back pain. Negative for arthralgias, gait problem, joint swelling, myalgias, neck pain and neck stiffness.  Skin: Negative.  Negative for rash.  Allergic/Immunologic: Negative.   Neurological: Negative.  Negative for tingling, weakness, numbness, headaches and paresthesias.  Hematological: Negative.  Negative for adenopathy. Does not bruise/bleed easily.  Psychiatric/Behavioral: Positive for dysphoric mood. Negative for suicidal ideas, hallucinations, behavioral problems,  confusion, sleep disturbance, self-injury, decreased concentration and agitation. The patient is nervous/anxious. The patient is not hyperactive.        Objective:   Physical Exam  Vitals reviewed. Constitutional: She is oriented to person, place, and time. She appears well-developed and well-nourished. No distress.  HENT:  Head: Normocephalic and atraumatic.  Mouth/Throat: Oropharynx is clear and moist. No oropharyngeal exudate.  Eyes: Conjunctivae are normal. Right eye exhibits no discharge. Left eye exhibits no discharge. No scleral icterus.  Neck: Normal range of motion. Neck supple. No JVD present. No tracheal deviation present. No thyromegaly present.  Cardiovascular: Normal rate, regular rhythm, normal heart sounds and intact distal pulses.  Exam reveals no gallop and no friction rub.   No murmur heard. Pulmonary/Chest: Effort normal and breath sounds normal. No stridor. No respiratory distress. She has no wheezes. She has no rales. She exhibits no tenderness.  Abdominal: Soft. Bowel sounds are normal. She exhibits no distension and no mass. There is no tenderness. There is no rebound and no guarding.  Musculoskeletal: Normal range of motion. She exhibits edema (trace pitting edema in BLE). She exhibits no tenderness.  Lymphadenopathy:    She has no cervical adenopathy.  Neurological: She is oriented to person, place, and time.  Skin: Skin is warm and dry. No rash noted. She is not diaphoretic. No erythema. No pallor.  Psychiatric: She has a normal mood and affect. Her behavior is normal. Judgment and thought content normal.     Lab Results  Component Value Date   WBC 5.8 07/30/2013   HGB 12.5 07/30/2013   HCT 37.2 07/30/2013   PLT 262.0 07/30/2013   GLUCOSE 86 07/30/2013   CHOL 218* 02/24/2014   TRIG 324.0* 02/24/2014   HDL 30.90* 02/24/2014   LDLDIRECT 60.2 08/30/2013   LDLCALC 122*  02/24/2014   ALT 10 07/30/2013   AST 17 07/30/2013   NA 140 07/30/2013   K 4.6 07/30/2013   CL 107 07/30/2013    CREATININE 0.9 07/30/2013   BUN 13 07/30/2013   CO2 28 07/30/2013   TSH 4.53 07/30/2013   INR 0.95 04/28/2013       Assessment & Plan:

## 2014-09-05 ENCOUNTER — Encounter: Payer: Self-pay | Admitting: Internal Medicine

## 2014-09-05 NOTE — Assessment & Plan Note (Signed)
Improvement in edema noted today

## 2014-09-05 NOTE — Assessment & Plan Note (Signed)
She is getting excellent pain relief with norco, will cont for now

## 2014-09-05 NOTE — Assessment & Plan Note (Signed)
She is improving with Viibryd and Xanax

## 2014-09-09 ENCOUNTER — Telehealth: Payer: Self-pay | Admitting: *Deleted

## 2014-09-09 NOTE — Telephone Encounter (Signed)
no

## 2014-09-09 NOTE — Telephone Encounter (Signed)
Called pt no answer LMOM with md response.../lmb 

## 2014-09-09 NOTE — Telephone Encounter (Signed)
Left msg on triage stating discuss with md about increasing her xanax to TID at her last ov, wanting to know instead of increasing xanax can she go back on lorazepam which she has taken before...Sherri Weber

## 2014-09-29 ENCOUNTER — Telehealth: Payer: Self-pay | Admitting: Internal Medicine

## 2014-09-29 ENCOUNTER — Other Ambulatory Visit (INDEPENDENT_AMBULATORY_CARE_PROVIDER_SITE_OTHER): Payer: Medicare Other

## 2014-09-29 ENCOUNTER — Encounter: Payer: Self-pay | Admitting: Internal Medicine

## 2014-09-29 ENCOUNTER — Ambulatory Visit (INDEPENDENT_AMBULATORY_CARE_PROVIDER_SITE_OTHER): Payer: Medicare Other | Admitting: Internal Medicine

## 2014-09-29 VITALS — BP 160/98 | HR 88 | Temp 98.3°F | Resp 16

## 2014-09-29 DIAGNOSIS — I10 Essential (primary) hypertension: Secondary | ICD-10-CM

## 2014-09-29 DIAGNOSIS — M150 Primary generalized (osteo)arthritis: Secondary | ICD-10-CM

## 2014-09-29 DIAGNOSIS — M159 Polyosteoarthritis, unspecified: Secondary | ICD-10-CM

## 2014-09-29 DIAGNOSIS — M8949 Other hypertrophic osteoarthropathy, multiple sites: Secondary | ICD-10-CM

## 2014-09-29 DIAGNOSIS — M545 Low back pain, unspecified: Secondary | ICD-10-CM

## 2014-09-29 DIAGNOSIS — S32009D Unspecified fracture of unspecified lumbar vertebra, subsequent encounter for fracture with routine healing: Secondary | ICD-10-CM

## 2014-09-29 DIAGNOSIS — R1115 Cyclical vomiting syndrome unrelated to migraine: Secondary | ICD-10-CM

## 2014-09-29 LAB — BASIC METABOLIC PANEL
BUN: 15 mg/dL (ref 6–23)
CO2: 24 mEq/L (ref 19–32)
Calcium: 9 mg/dL (ref 8.4–10.5)
Chloride: 102 mEq/L (ref 96–112)
Creatinine, Ser: 0.9 mg/dL (ref 0.4–1.2)
GFR: 62.53 mL/min (ref >60.00)
Glucose, Bld: 102 mg/dL — ABNORMAL HIGH (ref 70–99)
Potassium: 4.6 mEq/L (ref 3.5–5.1)
Sodium: 137 mEq/L (ref 135–145)

## 2014-09-29 LAB — URINALYSIS, ROUTINE W REFLEX MICROSCOPIC
Bilirubin Urine: NEGATIVE
Hgb urine dipstick: NEGATIVE
Ketones, ur: NEGATIVE
Nitrite: POSITIVE — AB
Specific Gravity, Urine: 1.025 (ref 1.000–1.030)
Total Protein, Urine: NEGATIVE
Urine Glucose: NEGATIVE
Urobilinogen, UA: 0.2 (ref 0.0–1.0)
pH: 5.5 (ref 5.0–8.0)

## 2014-09-29 LAB — TSH: TSH: 6.25 u[IU]/mL — ABNORMAL HIGH (ref 0.35–4.50)

## 2014-09-29 MED ORDER — HYDROCODONE-ACETAMINOPHEN 7.5-325 MG PO TABS: 1.0000 | ORAL_TABLET | Freq: Four times a day (QID) | ORAL | Status: DC | PRN

## 2014-09-29 MED ORDER — LOSARTAN POTASSIUM-HCTZ 100-12.5 MG PO TABS: 1.0000 | ORAL_TABLET | Freq: Every day | ORAL | Status: DC

## 2014-09-29 MED ORDER — AZILSARTAN-CHLORTHALIDONE 40-12.5 MG PO TABS: 1.0000 | ORAL_TABLET | Freq: Every day | ORAL | Status: DC

## 2014-09-29 MED ORDER — PROMETHAZINE HCL 12.5 MG PO TABS: ORAL_TABLET | ORAL | Status: DC

## 2014-09-29 NOTE — Assessment & Plan Note (Signed)
Cont norco as needed for pain 

## 2014-09-29 NOTE — Telephone Encounter (Signed)
changed

## 2014-09-29 NOTE — Progress Notes (Signed)
Pre visit review using our clinic review tool, if applicable. No additional management support is needed unless otherwise documented below in the visit note. 

## 2014-09-29 NOTE — Telephone Encounter (Signed)
Sherri Weber called from [facility] stated that insurance will not cover for [employer]. Please advise if there is anything else Sherri Weber wants her to take. It is going to cost her $170.

## 2014-09-29 NOTE — Patient Instructions (Signed)

## 2014-09-29 NOTE — Assessment & Plan Note (Signed)
Will cont norco as needed

## 2014-09-29 NOTE — Assessment & Plan Note (Signed)
Will stop phendimetrazine Start Edarbyclor, stop lasix Will check her lytes and UA today to look for secondary causes of HTN

## 2014-09-29 NOTE — Progress Notes (Signed)
Subjective:    Patient ID: Sherri BaltimoreShirley Weber, female    DOB: [birth year], 80 y.o.   MRN: [id number]  Hypertension This is a new problem. The current episode started today. The problem has been gradually worsening since onset. The problem is uncontrolled. Associated symptoms include anxiety and peripheral edema. Pertinent negatives include no blurred vision, chest pain, headaches, malaise/fatigue, neck pain, orthopnea, palpitations, PND, shortness of breath or sweats. Agents associated with hypertension include anorectics. Past treatments include diuretics. The current treatment provides mild improvement. Compliance problems include diet and exercise.       Review of Systems  Constitutional: Negative.  Negative for fever, chills, malaise/fatigue, diaphoresis, appetite change and fatigue.  HENT: Negative.   Eyes: Negative.  Negative for blurred vision.  Respiratory: Negative.  Negative for shortness of breath.   Cardiovascular: Negative.  Negative for chest pain, palpitations, orthopnea, leg swelling and PND.  Gastrointestinal: Positive for nausea. Negative for vomiting, abdominal pain, diarrhea, constipation, blood in stool, abdominal distention, anal bleeding and rectal pain.  Endocrine: Negative.   Genitourinary: Negative.  Negative for urgency, hematuria, flank pain and difficulty urinating.  Musculoskeletal: Positive for back pain. Negative for arthralgias, joint swelling, myalgias, neck pain and neck stiffness.  Skin: Negative.  Negative for rash.  Allergic/Immunologic: Negative.   Neurological: Positive for dizziness. Negative for tremors, seizures, syncope, facial asymmetry, speech difficulty, weakness, light-headedness, numbness and headaches.  Hematological: Negative.  Negative for adenopathy. Does not bruise/bleed easily.  Psychiatric/Behavioral: Negative.        Objective:   Physical Exam  Vitals reviewed. Constitutional: She is oriented to person, place, and time. She appears  well-developed and well-nourished. No distress.  HENT:  Head: Normocephalic and atraumatic.  Mouth/Throat: Oropharynx is clear and moist. No oropharyngeal exudate.  Eyes: Conjunctivae are normal. Right eye exhibits no discharge. Left eye exhibits no discharge. No scleral icterus.  Neck: Normal range of motion. Neck supple. No JVD present. No tracheal deviation present. No thyromegaly present.  Cardiovascular: Normal rate, regular rhythm, normal heart sounds and intact distal pulses.  Exam reveals no gallop and no friction rub.   No murmur heard. Pulmonary/Chest: Effort normal and breath sounds normal. No stridor. No respiratory distress. She has no wheezes. She has no rales. She exhibits no tenderness.  Abdominal: Soft. Bowel sounds are normal. She exhibits no distension and no mass. There is no tenderness. There is no rebound and no guarding.  Musculoskeletal: Normal range of motion. She exhibits edema (trace edema around both ankles). She exhibits no tenderness.  Lymphadenopathy:    She has no cervical adenopathy.  Neurological: She is oriented to person, place, and time.  Skin: Skin is warm and dry. No rash noted. She is not diaphoretic. No erythema. No pallor.  Psychiatric: She has a normal mood and affect. Her behavior is normal. Judgment and thought content normal.     Lab Results  Component Value Date   WBC 5.8 07/30/2013   HGB 12.5 07/30/2013   HCT 37.2 07/30/2013   PLT 262.0 07/30/2013   GLUCOSE 86 07/30/2013   CHOL 218* 02/24/2014   TRIG 324.0* 02/24/2014   HDL 30.90* 02/24/2014   LDLDIRECT 60.2 08/30/2013   LDLCALC 122* 02/24/2014   ALT 10 07/30/2013   AST 17 07/30/2013   NA 140 07/30/2013   K 4.6 07/30/2013   CL 107 07/30/2013   CREATININE 0.9 07/30/2013   BUN 13 07/30/2013   CO2 28 07/30/2013   TSH 4.53 07/30/2013   INR 0.95 04/28/2013  Assessment & Plan:

## 2014-09-30 ENCOUNTER — Telehealth: Payer: Self-pay | Admitting: Internal Medicine

## 2014-09-30 NOTE — Telephone Encounter (Signed)
emmi mailed  °

## 2014-10-24 ENCOUNTER — Other Ambulatory Visit: Payer: Self-pay | Admitting: Internal Medicine

## 2014-10-27 ENCOUNTER — Ambulatory Visit: Payer: Medicare Other | Admitting: Internal Medicine

## 2014-10-27 ENCOUNTER — Other Ambulatory Visit: Payer: Self-pay | Admitting: Internal Medicine

## 2014-10-27 ENCOUNTER — Telehealth: Payer: Self-pay

## 2014-10-27 DIAGNOSIS — S32009D Unspecified fracture of unspecified lumbar vertebra, subsequent encounter for fracture with routine healing: Secondary | ICD-10-CM

## 2014-10-27 DIAGNOSIS — M545 Low back pain, unspecified: Secondary | ICD-10-CM

## 2014-10-27 MED ORDER — HYDROCODONE-ACETAMINOPHEN 7.5-325 MG PO TABS: 1.0000 | ORAL_TABLET | Freq: Four times a day (QID) | ORAL | Status: DC | PRN

## 2014-10-27 NOTE — Telephone Encounter (Addendum)
Pt states that see was seen in the ED recently and scheduled to come in for follow up today. Due to the rain, she would like to reschedule and just get pain med refilled at this time. Pt last seen 09/2014, please advise

## 2014-10-27 NOTE — Telephone Encounter (Signed)
Pt notified rx ready.

## 2014-10-28 ENCOUNTER — Telehealth: Payer: Self-pay | Admitting: Internal Medicine

## 2014-10-28 ENCOUNTER — Other Ambulatory Visit: Payer: Self-pay | Admitting: Internal Medicine

## 2014-10-28 DIAGNOSIS — M545 Low back pain: Secondary | ICD-10-CM

## 2014-10-28 NOTE — Telephone Encounter (Signed)
Pt calling back as she wants to go to pain MD - Dr Vivi Fernsarby [phone]

## 2014-10-30 ENCOUNTER — Other Ambulatory Visit: Payer: Self-pay | Admitting: Internal Medicine

## 2014-11-01 ENCOUNTER — Other Ambulatory Visit: Payer: Self-pay | Admitting: Internal Medicine

## 2014-11-03 ENCOUNTER — Ambulatory Visit (INDEPENDENT_AMBULATORY_CARE_PROVIDER_SITE_OTHER): Payer: Medicare Other | Admitting: Internal Medicine

## 2014-11-03 ENCOUNTER — Encounter: Payer: Self-pay | Admitting: Internal Medicine

## 2014-11-03 VITALS — BP 144/70 | HR 80 | Temp 98.4°F | Resp 16 | Ht 65.5 in | Wt 123.0 lb

## 2014-11-03 DIAGNOSIS — S32009D Unspecified fracture of unspecified lumbar vertebra, subsequent encounter for fracture with routine healing: Secondary | ICD-10-CM

## 2014-11-03 DIAGNOSIS — I10 Essential (primary) hypertension: Secondary | ICD-10-CM

## 2014-11-03 DIAGNOSIS — M545 Low back pain, unspecified: Secondary | ICD-10-CM

## 2014-11-03 MED ORDER — HYDROCODONE-ACETAMINOPHEN 7.5-325 MG PO TABS: 1.0000 | ORAL_TABLET | Freq: Four times a day (QID) | ORAL | Status: DC | PRN

## 2014-11-03 NOTE — Progress Notes (Signed)
Pre visit review using our clinic review tool, if applicable. No additional management support is needed unless otherwise documented below in the visit note. 

## 2014-11-03 NOTE — Patient Instructions (Signed)

## 2014-11-03 NOTE — Progress Notes (Signed)
Subjective:    Patient ID: Sherri Weber, female    DOB: 1933-03-30, 80 y.o.   MRN: [id number]  Hypertension This is a chronic problem. The current episode started more than 1 year ago. The problem has been gradually improving since onset. The problem is controlled. Associated symptoms include anxiety. Pertinent negatives include no blurred vision, chest pain, headaches, malaise/fatigue, neck pain, orthopnea, palpitations, peripheral edema, PND, shortness of breath or sweats. Past treatments include angiotensin blockers and diuretics. The current treatment provides moderate improvement. Compliance problems include diet and exercise.       Review of Systems  Constitutional: Negative.  Negative for fever, chills, malaise/fatigue, diaphoresis, appetite change and fatigue.  HENT: Negative.   Eyes: Negative.  Negative for blurred vision.  Respiratory: Negative.  Negative for cough, choking, chest tightness, shortness of breath and stridor.   Cardiovascular: Negative.  Negative for chest pain, palpitations, orthopnea, leg swelling and PND.  Gastrointestinal: Negative.  Negative for nausea, abdominal pain, diarrhea and blood in stool.  Endocrine: Negative.   Genitourinary: Negative.   Musculoskeletal: Positive for back pain. Negative for joint swelling, gait problem and neck pain.  Skin: Negative.  Negative for rash.  Allergic/Immunologic: Negative.   Neurological: Negative.  Negative for dizziness, tremors, weakness, numbness and headaches.  Hematological: Negative.  Negative for adenopathy. Does not bruise/bleed easily.  Psychiatric/Behavioral: Positive for sleep disturbance and dysphoric mood. Negative for suicidal ideas, hallucinations, behavioral problems, confusion, self-injury, decreased concentration and agitation. The patient is nervous/anxious. The patient is not hyperactive.        Objective:   Physical Exam  Constitutional: She is oriented to person, place, and time. She  appears well-developed and well-nourished. No distress.  HENT:  Head: Normocephalic and atraumatic.  Mouth/Throat: Oropharynx is clear and moist. No oropharyngeal exudate.  Eyes: Conjunctivae are normal. Right eye exhibits no discharge. Left eye exhibits no discharge. No scleral icterus.  Neck: Normal range of motion. Neck supple. No JVD present. No tracheal deviation present. No thyromegaly present.  Cardiovascular: Normal rate, regular rhythm, normal heart sounds and intact distal pulses.  Exam reveals no gallop and no friction rub.   No murmur heard. Pulmonary/Chest: Effort normal and breath sounds normal. No stridor. No respiratory distress. She has no wheezes. She has no rales. She exhibits no tenderness.  Abdominal: Soft. Bowel sounds are normal. She exhibits no distension and no mass. There is no tenderness. There is no rebound and no guarding.  Musculoskeletal: Normal range of motion. She exhibits no edema or tenderness.  Lymphadenopathy:    She has no cervical adenopathy.  Neurological: She is oriented to person, place, and time.  Skin: Skin is warm and dry. No rash noted. She is not diaphoretic. No erythema. No pallor.  Psychiatric: She has a normal mood and affect. Her behavior is normal. Judgment and thought content normal.  Vitals reviewed.     Lab Results  Component Value Date   WBC 5.8 07/30/2013   HGB 12.5 07/30/2013   HCT 37.2 07/30/2013   PLT 262.0 07/30/2013   GLUCOSE 102* 09/29/2014   CHOL 218* 02/24/2014   TRIG 324.0* 02/24/2014   HDL 30.90* 02/24/2014   LDLDIRECT 60.2 08/30/2013   LDLCALC 122* 02/24/2014   ALT 10 07/30/2013   AST 17 07/30/2013   NA 137 09/29/2014   K 4.6 09/29/2014   CL 102 09/29/2014   CREATININE 0.9 09/29/2014   BUN 15 09/29/2014   CO2 24 09/29/2014   TSH 6.25* 09/29/2014   INR 0.95  04/28/2013      Assessment & Plan:

## 2014-11-04 NOTE — Assessment & Plan Note (Signed)
Will cont norco as needed for back pain

## 2014-11-04 NOTE — Assessment & Plan Note (Signed)
Her BP is well controlled 

## 2014-11-06 ENCOUNTER — Telehealth: Payer: Self-pay | Admitting: Internal Medicine

## 2014-11-06 NOTE — Telephone Encounter (Signed)
Pt called wondering if Dr. Jonny RuizJohn will be able to give her something for her leg tinglings and refill for xanax (pt is out of this med because she has a stressful weeks and she had to take it to help her go sleep ). Please advise. [facility]

## 2014-11-06 NOTE — Telephone Encounter (Signed)
I will not give her any more narcotics

## 2014-11-06 NOTE — Telephone Encounter (Signed)
Left vm for pt to callback 

## 2014-11-07 ENCOUNTER — Ambulatory Visit: Payer: Medicare Other | Admitting: Internal Medicine

## 2014-11-12 ENCOUNTER — Ambulatory Visit: Payer: Medicare Other | Admitting: Internal Medicine

## 2014-11-26 ENCOUNTER — Ambulatory Visit: Payer: Medicare Other | Admitting: Internal Medicine

## 2014-12-02 ENCOUNTER — Other Ambulatory Visit: Payer: Self-pay | Admitting: Internal Medicine

## 2014-12-03 NOTE — Telephone Encounter (Signed)
This is a weight loss medication. Will deny as last filled in October. Can be addressed when Dr. Yetta BarreJones returns.

## 2014-12-03 NOTE — Telephone Encounter (Signed)
Med is not on med list. I'm not familiar with med & MD is out of office. Pls advise on refill...[name]/lmb

## 2014-12-08 ENCOUNTER — Encounter: Payer: Self-pay | Admitting: Internal Medicine

## 2014-12-08 ENCOUNTER — Other Ambulatory Visit (INDEPENDENT_AMBULATORY_CARE_PROVIDER_SITE_OTHER): Payer: Medicare Other

## 2014-12-08 ENCOUNTER — Ambulatory Visit (INDEPENDENT_AMBULATORY_CARE_PROVIDER_SITE_OTHER): Payer: Medicare Other | Admitting: Internal Medicine

## 2014-12-08 VITALS — BP 142/70 | HR 89 | Temp 98.1°F | Resp 16 | Ht 65.5 in | Wt 128.0 lb

## 2014-12-08 DIAGNOSIS — F418 Other specified anxiety disorders: Secondary | ICD-10-CM

## 2014-12-08 DIAGNOSIS — R946 Abnormal results of thyroid function studies: Secondary | ICD-10-CM

## 2014-12-08 DIAGNOSIS — E781 Pure hyperglyceridemia: Secondary | ICD-10-CM

## 2014-12-08 DIAGNOSIS — M545 Low back pain: Secondary | ICD-10-CM

## 2014-12-08 DIAGNOSIS — R7989 Other specified abnormal findings of blood chemistry: Secondary | ICD-10-CM

## 2014-12-08 DIAGNOSIS — I10 Essential (primary) hypertension: Secondary | ICD-10-CM

## 2014-12-08 LAB — LIPID PANEL
Cholesterol: 218 mg/dL — ABNORMAL HIGH (ref 0–200)
HDL: 31 mg/dL — ABNORMAL LOW (ref >39.00)
NonHDL: 187
Total CHOL/HDL Ratio: 7
Triglycerides: 368 mg/dL — ABNORMAL HIGH (ref 0.0–149.0)
VLDL: 73.6 mg/dL — ABNORMAL HIGH (ref 0.0–40.0)

## 2014-12-08 LAB — COMPREHENSIVE METABOLIC PANEL
ALT: 12 U/L (ref 0–35)
AST: 16 U/L (ref 0–37)
Albumin: 4.3 g/dL (ref 3.5–5.2)
Alkaline Phosphatase: 59 U/L (ref 39–117)
BUN: 18 mg/dL (ref 6–23)
CO2: 27 mEq/L (ref 19–32)
Calcium: 9.6 mg/dL (ref 8.4–10.5)
Chloride: 104 mEq/L (ref 96–112)
Creatinine, Ser: 0.9 mg/dL (ref 0.4–1.2)
GFR: 66.66 mL/min (ref >60.00)
Glucose, Bld: 112 mg/dL — ABNORMAL HIGH (ref 70–99)
Potassium: 4.4 mEq/L (ref 3.5–5.1)
Sodium: 140 mEq/L (ref 135–145)
Total Bilirubin: 0.4 mg/dL (ref 0.2–1.2)
Total Protein: 7.6 g/dL (ref 6.0–8.3)

## 2014-12-08 LAB — LDL CHOLESTEROL, DIRECT: Direct LDL: 112.6 mg/dL

## 2014-12-08 LAB — T3, FREE: T3, Free: 3 pg/mL (ref 2.3–4.2)

## 2014-12-08 LAB — T4, FREE: Free T4: 0.83 ng/dL (ref 0.60–1.60)

## 2014-12-08 LAB — T4: T4, Total: 6.9 ug/dL (ref 4.5–12.0)

## 2014-12-08 LAB — TSH: TSH: 2.57 u[IU]/mL (ref 0.35–4.50)

## 2014-12-08 MED ORDER — TRAZODONE HCL 150 MG PO TABS: 150.0000 mg | ORAL_TABLET | Freq: Every evening | ORAL | Status: DC | PRN

## 2014-12-08 NOTE — Patient Instructions (Signed)

## 2014-12-08 NOTE — Progress Notes (Signed)
Pre visit review using our clinic review tool, if applicable. No additional management support is needed unless otherwise documented below in the visit note. 

## 2014-12-08 NOTE — Progress Notes (Signed)
   Subjective:    Patient ID: Sherri BaltimoreShirley Weber, female    DOB: Oct 24, 1933, 80 y.o.   MRN: [id number]  Hypertension This is a chronic problem. The current episode started more than 1 year ago. The problem is unchanged. The problem is controlled. Associated symptoms include anxiety. Pertinent negatives include no blurred vision, chest pain, headaches, malaise/fatigue, neck pain, orthopnea, palpitations, peripheral edema, PND, shortness of breath or sweats. Past treatments include angiotensin blockers and diuretics. The current treatment provides moderate improvement. There are no compliance problems.       Review of Systems  Constitutional: Negative.  Negative for fever, chills, malaise/fatigue, diaphoresis, appetite change and fatigue.  HENT: Negative.   Eyes: Negative.  Negative for blurred vision.  Respiratory: Negative.  Negative for cough, choking, chest tightness, shortness of breath and stridor.   Cardiovascular: Negative.  Negative for chest pain, palpitations, orthopnea, leg swelling and PND.  Gastrointestinal: Negative.  Negative for nausea, vomiting, abdominal pain, diarrhea, constipation and blood in stool.  Endocrine: Negative.   Genitourinary: Negative.   Musculoskeletal: Positive for back pain and arthralgias. Negative for myalgias, joint swelling, gait problem, neck pain and neck stiffness.  Skin: Negative.   Allergic/Immunologic: Negative.   Neurological: Negative.  Negative for dizziness and headaches.  Hematological: Negative.   Psychiatric/Behavioral: Positive for sleep disturbance and dysphoric mood. Negative for suicidal ideas, hallucinations, behavioral problems, confusion, self-injury, decreased concentration and agitation. The patient is nervous/anxious.        Objective:   Physical Exam  Constitutional: She is oriented to person, place, and time. She appears well-developed and well-nourished. No distress.  HENT:  Head: Normocephalic and atraumatic.    Mouth/Throat: Oropharynx is clear and moist. No oropharyngeal exudate.  Eyes: Conjunctivae are normal. Right eye exhibits no discharge. Left eye exhibits no discharge. No scleral icterus.  Neck: Normal range of motion. Neck supple. No JVD present. No tracheal deviation present. No thyromegaly present.  Cardiovascular: Normal rate, regular rhythm, normal heart sounds and intact distal pulses.  Exam reveals no gallop and no friction rub.   No murmur heard. Pulmonary/Chest: Effort normal and breath sounds normal. No stridor. No respiratory distress. She has no wheezes. She has no rales. She exhibits no tenderness.  Abdominal: Soft. Bowel sounds are normal. She exhibits no distension and no mass. There is no tenderness. There is no rebound and no guarding.  Musculoskeletal: Normal range of motion. She exhibits no edema or tenderness.  Lymphadenopathy:    She has no cervical adenopathy.  Neurological: She is oriented to person, place, and time.  Skin: Skin is warm and dry. No rash noted. She is not diaphoretic. No erythema. No pallor.  Psychiatric: She has a normal mood and affect. Her behavior is normal. Judgment and thought content normal.  Vitals reviewed.    Lab Results  Component Value Date   WBC 5.8 07/30/2013   HGB 12.5 07/30/2013   HCT 37.2 07/30/2013   PLT 262.0 07/30/2013   GLUCOSE 102* 09/29/2014   CHOL 218* 02/24/2014   TRIG 324.0* 02/24/2014   HDL 30.90* 02/24/2014   LDLDIRECT 60.2 08/30/2013   LDLCALC 122* 02/24/2014   ALT 10 07/30/2013   AST 17 07/30/2013   NA 137 09/29/2014   K 4.6 09/29/2014   CL 102 09/29/2014   CREATININE 0.9 09/29/2014   BUN 15 09/29/2014   CO2 24 09/29/2014   TSH 6.25* 09/29/2014   INR 0.95 04/28/2013       Assessment & Plan:

## 2014-12-09 NOTE — Assessment & Plan Note (Signed)
She appears to be euthyroid Her TFT's are normal now 

## 2014-12-09 NOTE — Assessment & Plan Note (Signed)
Her BP is well controlled 

## 2014-12-09 NOTE — Assessment & Plan Note (Signed)
She has not been compliant with Viibryd because it did not help her much with insomnia Will change to trazodone  She will cont the other meds with no changes

## 2014-12-22 ENCOUNTER — Telehealth: Payer: Self-pay | Admitting: Internal Medicine

## 2014-12-22 NOTE — Telephone Encounter (Signed)
Pt is requesting to see Dr Yetta BarreJones tomorrow 12/29, she is depressed and has not left the house in a couple days and really wants to see PCP.

## 2014-12-23 ENCOUNTER — Encounter: Payer: Self-pay | Admitting: Internal Medicine

## 2014-12-29 ENCOUNTER — Telehealth: Payer: Self-pay | Admitting: Internal Medicine

## 2014-12-29 ENCOUNTER — Ambulatory Visit: Payer: Medicare Other | Admitting: Internal Medicine

## 2014-12-29 NOTE — Telephone Encounter (Signed)
Patient requesting a refill of thendinetrazine tartarte (advised that i do not have this drug on record). She insists that she was prescribed on 09/03/2014 when she was seen. She states that this diet pill does not make her lose weight, but it gives her energy. She states that she needs it to be happy and its the only way to get her daughter [name] out of her head.Marland KitchenMarland Kitchen

## 2014-12-29 NOTE — Telephone Encounter (Signed)
I don't want her to take that medication any more

## 2014-12-30 NOTE — Telephone Encounter (Signed)
Called patient to advise of note from Dr. Yetta BarreJones. Patient became irate. She states that she needs these meds to get out of the house. She also stated that in this case, she wants dr Yetta Barrejones to increase her xanax from 2/day to 3/day. Scheduled an appt.

## 2015-01-01 ENCOUNTER — Encounter: Payer: Self-pay | Admitting: Internal Medicine

## 2015-01-01 ENCOUNTER — Ambulatory Visit (INDEPENDENT_AMBULATORY_CARE_PROVIDER_SITE_OTHER): Payer: Commercial Managed Care - HMO | Admitting: Internal Medicine

## 2015-01-01 VITALS — BP 130/68 | HR 75 | Temp 98.0°F | Ht 65.5 in | Wt 131.0 lb

## 2015-01-01 DIAGNOSIS — K219 Gastro-esophageal reflux disease without esophagitis: Secondary | ICD-10-CM

## 2015-01-01 DIAGNOSIS — M159 Polyosteoarthritis, unspecified: Secondary | ICD-10-CM

## 2015-01-01 DIAGNOSIS — M545 Low back pain, unspecified: Secondary | ICD-10-CM

## 2015-01-01 DIAGNOSIS — I10 Essential (primary) hypertension: Secondary | ICD-10-CM

## 2015-01-01 DIAGNOSIS — S32009D Unspecified fracture of unspecified lumbar vertebra, subsequent encounter for fracture with routine healing: Secondary | ICD-10-CM

## 2015-01-01 DIAGNOSIS — F418 Other specified anxiety disorders: Secondary | ICD-10-CM

## 2015-01-01 DIAGNOSIS — M150 Primary generalized (osteo)arthritis: Secondary | ICD-10-CM

## 2015-01-01 DIAGNOSIS — Z23 Encounter for immunization: Secondary | ICD-10-CM

## 2015-01-01 DIAGNOSIS — M8949 Other hypertrophic osteoarthropathy, multiple sites: Secondary | ICD-10-CM

## 2015-01-01 MED ORDER — ALPRAZOLAM 0.5 MG PO TABS: 0.5000 mg | ORAL_TABLET | Freq: Two times a day (BID) | ORAL | Status: DC

## 2015-01-01 MED ORDER — PROMETHAZINE HCL 12.5 MG PO TABS: 12.5000 mg | ORAL_TABLET | Freq: Three times a day (TID) | ORAL | Status: DC | PRN

## 2015-01-01 MED ORDER — HYDROCODONE-ACETAMINOPHEN 7.5-325 MG PO TABS: 1.0000 | ORAL_TABLET | Freq: Four times a day (QID) | ORAL | Status: DC | PRN

## 2015-01-01 MED ORDER — ZOLPIDEM TARTRATE 10 MG PO TABS: ORAL_TABLET | ORAL | Status: DC

## 2015-01-01 NOTE — Assessment & Plan Note (Signed)
Will cont norco as needed for pain 

## 2015-01-01 NOTE — Assessment & Plan Note (Signed)
Will cont xanax as needed and ambien for insomnia

## 2015-01-01 NOTE — Progress Notes (Signed)
Subjective:    Patient ID: Sherri Weber, female    DOB: 1933/09/20, 81 y.o.   MRN: [id number]  Hypertension This is a chronic problem. The current episode started more than 1 year ago. The problem is unchanged. The problem is controlled. Associated symptoms include anxiety. Pertinent negatives include no blurred vision, chest pain, headaches, malaise/fatigue, orthopnea, palpitations, peripheral edema, PND, shortness of breath or sweats. There are no associated agents to hypertension. Past treatments include angiotensin blockers and diuretics. The current treatment provides moderate improvement. There are no compliance problems.       Review of Systems  Constitutional: Negative.  Negative for fever, chills, malaise/fatigue, diaphoresis, appetite change and fatigue.  HENT: Negative.   Eyes: Negative.  Negative for blurred vision.  Respiratory: Negative.  Negative for cough, choking, chest tightness, shortness of breath and stridor.   Cardiovascular: Negative.  Negative for chest pain, palpitations, orthopnea, leg swelling and PND.  Gastrointestinal: Negative.  Negative for nausea, vomiting, abdominal pain, diarrhea, constipation and blood in stool.  Endocrine: Negative.   Genitourinary: Negative.   Musculoskeletal: Positive for back pain and arthralgias. Negative for joint swelling.  Skin: Negative.   Allergic/Immunologic: Negative.   Neurological: Negative.  Negative for headaches.  Hematological: Negative.  Negative for adenopathy. Does not bruise/bleed easily.  Psychiatric/Behavioral: Positive for sleep disturbance and dysphoric mood. Negative for suicidal ideas, hallucinations, behavioral problems, confusion, self-injury, decreased concentration and agitation. The patient is nervous/anxious. The patient is not hyperactive.        Objective:   Physical Exam  Constitutional: She is oriented to person, place, and time. She appears well-developed and well-nourished. No distress.    HENT:  Head: Normocephalic and atraumatic.  Mouth/Throat: Oropharynx is clear and moist. No oropharyngeal exudate.  Eyes: Conjunctivae are normal. Right eye exhibits no discharge. Left eye exhibits no discharge. No scleral icterus.  Neck: Normal range of motion. Neck supple. No JVD present. No tracheal deviation present. No thyromegaly present.  Cardiovascular: Normal rate, regular rhythm, normal heart sounds and intact distal pulses.  Exam reveals no gallop and no friction rub.   No murmur heard. Pulmonary/Chest: Effort normal and breath sounds normal. No stridor. No respiratory distress. She has no wheezes. She has no rales. She exhibits no tenderness.  Abdominal: Soft. Bowel sounds are normal. She exhibits no distension and no mass. There is no tenderness. There is no rebound and no guarding.  Musculoskeletal: Normal range of motion. She exhibits no edema or tenderness.  Lymphadenopathy:    She has no cervical adenopathy.  Neurological: She is oriented to person, place, and time.  Skin: Skin is warm and dry. No rash noted. She is not diaphoretic. No erythema. No pallor.  Psychiatric: She has a normal mood and affect. Her behavior is normal. Judgment and thought content normal.  Vitals reviewed.   Lab Results  Component Value Date   WBC 5.8 07/30/2013   HGB 12.5 07/30/2013   HCT 37.2 07/30/2013   PLT 262.0 07/30/2013   GLUCOSE 112* 12/08/2014   CHOL 218* 12/08/2014   TRIG 368.0* 12/08/2014   HDL 31.00* 12/08/2014   LDLDIRECT 112.6 12/08/2014   LDLCALC 122* 02/24/2014   ALT 12 12/08/2014   AST 16 12/08/2014   NA 140 12/08/2014   K 4.4 12/08/2014   CL 104 12/08/2014   CREATININE 0.9 12/08/2014   BUN 18 12/08/2014   CO2 27 12/08/2014   TSH 2.57 12/08/2014   INR 0.95 04/28/2013        Assessment &  Plan:

## 2015-01-01 NOTE — Patient Instructions (Signed)

## 2015-01-01 NOTE — Assessment & Plan Note (Signed)
Will cont norco as needed for the pain

## 2015-01-01 NOTE — Assessment & Plan Note (Signed)
Her BP is well controlled 

## 2015-01-23 ENCOUNTER — Other Ambulatory Visit: Payer: Self-pay | Admitting: Internal Medicine

## 2015-01-26 ENCOUNTER — Encounter: Payer: Self-pay | Admitting: Internal Medicine

## 2015-01-26 ENCOUNTER — Ambulatory Visit (INDEPENDENT_AMBULATORY_CARE_PROVIDER_SITE_OTHER): Payer: Commercial Managed Care - HMO | Admitting: Internal Medicine

## 2015-01-26 VITALS — BP 140/78 | HR 70 | Temp 98.1°F | Resp 16 | Ht 65.5 in | Wt 131.5 lb

## 2015-01-26 DIAGNOSIS — I10 Essential (primary) hypertension: Secondary | ICD-10-CM

## 2015-01-26 DIAGNOSIS — Z433 Encounter for attention to colostomy: Secondary | ICD-10-CM

## 2015-01-26 DIAGNOSIS — R197 Diarrhea, unspecified: Secondary | ICD-10-CM

## 2015-01-26 NOTE — Patient Instructions (Signed)
Chronic Diarrhea  Diarrhea is frequent loose and watery bowel movements. It can cause you to feel weak and dehydrated. Dehydration can cause you to become tired and thirsty and to have a dry mouth, decreased urination, and dark yellow urine. Diarrhea is a sign of another problem, most often an infection that will not last long. In most cases, diarrhea lasts 2-3 days. Diarrhea that lasts longer than 4 weeks is called long-lasting (chronic) diarrhea. It is important to treat your diarrhea as directed by your health care provider to lessen or prevent future episodes of diarrhea.   CAUSES   There are many causes of chronic diarrhea. The following are some possible causes:   · Gastrointestinal infections caused by viruses, bacteria, or parasites.    · Food poisoning or food allergies.    · Certain medicines, such as antibiotics, chemotherapy, and laxatives.    · Artificial sweeteners and fructose.    · Digestive disorders, such as celiac disease and inflammatory bowel diseases.    · Irritable bowel syndrome.  · Some disorders of the pancreas.  · Disorders of the thyroid.  · Reduced blood flow to the intestines.  · Cancer.  Sometimes the cause of chronic diarrhea is unknown.  RISK FACTORS  · Having a severely weakened immune system, such as from HIV or AIDS.    · Taking certain types of cancer-fighting drugs (such as with chemotherapy) or other medicines.    · Having had a recent organ transplant.    · Having a portion of the stomach or small bowel removed.    · Traveling to countries where food and water supplies are often contaminated.    SYMPTOMS   In addition to frequent, loose stools, diarrhea may cause:   · Cramping.    · Abdominal pain.    · Nausea.    · Fever.  · Fatigue.  · Urgent need to use the bathroom.  · Loss of bowel control.  DIAGNOSIS   Your health care provider must take a careful history and perform a physical exam. Tests given are based on your symptoms and history. Tests may include:   · Blood or  stool tests. Three or more stool samples may be examined. Stool cultures may be used to test for bacteria or parasites.    · X-rays.    · A procedure in which a thin tube is inserted into the mouth or rectum (endoscopy). This allows the health care provider to look inside the intestine.    TREATMENT   · Treatment is aimed at correcting the cause of the diarrhea when possible.  · Diarrhea caused by an infection can often be treated with antibiotic medicines.  · Diarrhea not caused by an infection may require you to take long-term medicine or have surgery. Specific treatment should be discussed with your health care provider.  · If the cause cannot be determined, treatment aims to relieve symptoms and prevent dehydration. Serious health problems can occur if you do not maintain proper fluid levels. Treatment may include:  ¨ Taking an oral rehydration solution (ORS).  ¨ Not drinking beverages that contain caffeine (such as tea, coffee, and soft drinks).  ¨ Not drinking alcohol.  ¨ Maintaining well-balanced nutrition to help you recover faster.  HOME CARE INSTRUCTIONS   · Drink enough fluids to keep urine clear or pale yellow. Drink 1 cup (8 oz) of fluid for each diarrhea episode. Avoid fluids that contain simple sugars, fruit juices, whole milk products, and sodas. Hydrate with an ORS. You may purchase the ORS or prepare it at home by mixing the   following ingredients together:  ¨  - tsp (1.7-3  mL) table salt.  ¨ ¾ tsp (3 ¾ mL) baking soda.  ¨  tsp (1.7 mL) salt substitute containing potassium chloride.  ¨ 1 tbsp (20 mL) sugar.  ¨ 4.2 c (1 L) of water.    · Certain foods and beverages may increase the speed at which food moves through the gastrointestinal (GI) tract. These foods and beverages should be avoided. They include:  ¨ Caffeinated and alcoholic beverages.  ¨ High-fiber foods, such as raw fruits and vegetables, nuts, seeds, and whole grain breads and cereals.  ¨ Foods and beverages sweetened with sugar  alcohols, such as xylitol, sorbitol, and mannitol.    · Some foods may be well tolerated and may help thicken stool. These include:  ¨ Starchy foods, such as rice, toast, pasta, low-sugar cereal, oatmeal, grits, baked potatoes, crackers, and bagels.  ¨ Bananas.  ¨ Applesauce.  · Add probiotic-rich foods to help increase healthy bacteria in the GI tract. These include yogurt and fermented milk products.  · Wash your hands well after each diarrhea episode.  · Only take over-the-counter or prescription medicines as directed by your health care provider.  · Take a warm bath to relieve any burning or pain from frequent diarrhea episodes.  SEEK MEDICAL CARE IF:   · You are not urinating as often.  · Your urine is a dark color.  · You become very tired or dizzy.  · You have severe pain in the abdomen or rectum.  · Your have blood or pus in your stools.  · Your stools look black and tarry.  SEEK IMMEDIATE MEDICAL CARE IF:   · You are unable to keep fluids down.  · You have persistent vomiting.  · You have blood in your stool.  · Your stools are black and tarry.  · You do not urinate in 6-8 hours, or there is only a small amount of very dark urine.  · You have abdominal pain that increases or localizes.  · You have weakness, dizziness, confusion, or lightheadedness.  · You have a severe headache.  · Your diarrhea gets worse or does not get better.  · You have a fever or persistent symptoms for more than 2-3 days.  · You have a fever and your symptoms suddenly get worse.  MAKE SURE YOU:   · Understand these instructions.  · Will watch your condition.  · Will get help right away if you are not doing well or get worse.  Document Released: 03/03/2004 Document Revised: 12/17/2013 Document Reviewed: 06/06/2013  [employer]® Patient Information ©2015 [employer]. This information is not intended to replace advice given to you by your health care provider. Make sure you discuss any questions you have with your health care  provider.

## 2015-01-26 NOTE — Progress Notes (Signed)
Pre visit review using our clinic review tool, if applicable. No additional management support is needed unless otherwise documented below in the visit note. 

## 2015-01-27 ENCOUNTER — Encounter: Payer: Self-pay | Admitting: Internal Medicine

## 2015-01-27 NOTE — Assessment & Plan Note (Signed)
Her BP is well controlled 

## 2015-01-27 NOTE — Progress Notes (Signed)
   Subjective:    Patient ID: Sherri BaltimoreShirley Peckham, female    DOB: [birth year], 81 y.o.   MRN: [id number]  HPI  She complains of chronic diarrhea in her colostomy bag, she saw a surgeon in [place] and few weeks ago and a CT was done with unremarkable results. She tells me that she has not seen a GI in "a long time."   Review of Systems  Constitutional: Positive for fatigue. Negative for fever, chills, diaphoresis and appetite change.  HENT: Negative.   Eyes: Negative.   Respiratory: Negative.  Negative for cough, choking, chest tightness, shortness of breath and stridor.   Cardiovascular: Negative.  Negative for chest pain, palpitations and leg swelling.  Gastrointestinal: Positive for diarrhea. Negative for nausea, vomiting, abdominal pain, constipation, blood in stool and rectal pain.  Endocrine: Negative.   Genitourinary: Negative.   Musculoskeletal: Positive for back pain and arthralgias.  Skin: Negative.  Negative for rash.  Allergic/Immunologic: Negative.   Neurological: Negative.   Hematological: Negative.  Negative for adenopathy. Does not bruise/bleed easily.  Psychiatric/Behavioral: Positive for sleep disturbance. Negative for suicidal ideas, behavioral problems, self-injury, dysphoric mood, decreased concentration and agitation. The patient is nervous/anxious.        Objective:   Physical Exam  Constitutional: She is oriented to person, place, and time. She appears well-developed and well-nourished.  Non-toxic appearance. She does not have a sickly appearance. She does not appear ill. No distress.  HENT:  Head: Normocephalic and atraumatic.  Mouth/Throat: Oropharynx is clear and moist. No oropharyngeal exudate.  Eyes: Conjunctivae are normal. Right eye exhibits no discharge. Left eye exhibits no discharge. No scleral icterus.  Neck: Normal range of motion. Neck supple. No JVD present. No tracheal deviation present. No thyromegaly present.  Cardiovascular: Normal rate, regular  rhythm, normal heart sounds and intact distal pulses.  Exam reveals no gallop and no friction rub.   No murmur heard. Pulmonary/Chest: Effort normal and breath sounds normal. No stridor. No respiratory distress. She has no wheezes. She has no rales. She exhibits no tenderness.  Abdominal: Soft. Bowel sounds are normal. She exhibits no distension and no mass. There is no tenderness. There is no rebound and no guarding.    Musculoskeletal: Normal range of motion. She exhibits no edema or tenderness.  Lymphadenopathy:    She has no cervical adenopathy.  Neurological: She is oriented to person, place, and time.  Skin: Skin is warm and dry. No rash noted. She is not diaphoretic. No erythema. No pallor.  Psychiatric: She has a normal mood and affect. Her behavior is normal. Judgment and thought content normal.  Vitals reviewed.    Lab Results  Component Value Date   WBC 5.8 07/30/2013   HGB 12.5 07/30/2013   HCT 37.2 07/30/2013   PLT 262.0 07/30/2013   GLUCOSE 112* 12/08/2014   CHOL 218* 12/08/2014   TRIG 368.0* 12/08/2014   HDL 31.00* 12/08/2014   LDLDIRECT 112.6 12/08/2014   LDLCALC 122* 02/24/2014   ALT 12 12/08/2014   AST 16 12/08/2014   NA 140 12/08/2014   K 4.4 12/08/2014   CL 104 12/08/2014   CREATININE 0.9 12/08/2014   BUN 18 12/08/2014   CO2 27 12/08/2014   TSH 2.57 12/08/2014   INR 0.95 04/28/2013       Assessment & Plan:

## 2015-01-27 NOTE — Assessment & Plan Note (Signed)
This is too complicated for me to evaluate I have asked her to f/up with her GI

## 2015-02-13 ENCOUNTER — Telehealth: Payer: Self-pay | Admitting: Internal Medicine

## 2015-02-13 NOTE — Telephone Encounter (Signed)
Pt is aware Dr. Yetta BarreJones is out of the office today 2/19.

## 2015-02-13 NOTE — Telephone Encounter (Signed)
Called pt regarding her GI referral. She states she is currently in a rehab facility [facility]([place] in [place]) because she fractured her hip 2 weeks ago and will have to address the referral at a later time. She states she's been feeling very depressed since she's been at rehab and her family lives out of town. She is requesting that you prescribe her something.

## 2015-02-13 NOTE — Telephone Encounter (Signed)
This needs to addressed by someone at the rehab facility

## 2015-02-16 NOTE — Telephone Encounter (Signed)
Left message on pt's VM to return call.

## 2015-03-09 ENCOUNTER — Telehealth: Payer: Self-pay | Admitting: Internal Medicine

## 2015-03-09 NOTE — Telephone Encounter (Signed)
Has tried since Friday to contact patient, no answer   Left number [phone], this is the emergency contact number for the patient

## 2015-03-10 NOTE — Telephone Encounter (Signed)
[facility] / [facility] called in needing verbal orders for   PT 2x week for 6 week beginning 03/09/15

## 2015-03-11 NOTE — Telephone Encounter (Signed)
yes

## 2015-03-11 NOTE — Telephone Encounter (Signed)
LMOVM advising per MD. 

## 2015-03-13 ENCOUNTER — Ambulatory Visit: Payer: Commercial Managed Care - HMO | Admitting: Family

## 2015-03-13 ENCOUNTER — Telehealth: Payer: Self-pay | Admitting: Internal Medicine

## 2015-03-13 NOTE — Telephone Encounter (Signed)
Patient no showed for acute visit with Greg today.  Please advise.  °

## 2015-03-16 ENCOUNTER — Telehealth: Payer: Self-pay | Admitting: Internal Medicine

## 2015-03-16 NOTE — Telephone Encounter (Signed)
Requesting nursing evaluation b/c patient went to ER on Friday.  Patient has cellulitis on left leg.  Patient was prescribed sulfamechoxacole - antibiotic.  States it is having an interaction with Losartan.

## 2015-03-16 NOTE — Telephone Encounter (Signed)
Left vm for patient to call back to schedule appt.  Also, called nurse to notify.

## 2015-03-16 NOTE — Telephone Encounter (Signed)
Pt need to be seen for ER followup

## 2015-03-18 ENCOUNTER — Telehealth: Payer: Self-pay | Admitting: Internal Medicine

## 2015-03-18 ENCOUNTER — Ambulatory Visit (INDEPENDENT_AMBULATORY_CARE_PROVIDER_SITE_OTHER): Payer: Commercial Managed Care - HMO | Admitting: Internal Medicine

## 2015-03-18 ENCOUNTER — Encounter: Payer: Self-pay | Admitting: Internal Medicine

## 2015-03-18 VITALS — BP 130/74 | HR 85 | Temp 97.9°F | Resp 16 | Wt 128.0 lb

## 2015-03-18 DIAGNOSIS — I10 Essential (primary) hypertension: Secondary | ICD-10-CM | POA: Diagnosis not present

## 2015-03-18 DIAGNOSIS — F418 Other specified anxiety disorders: Secondary | ICD-10-CM

## 2015-03-18 DIAGNOSIS — M8080XA Other osteoporosis with current pathological fracture, unspecified site, initial encounter for fracture: Secondary | ICD-10-CM

## 2015-03-18 MED ORDER — LEVOMILNACIPRAN HCL ER 40 MG PO CP24: 1.0000 | ORAL_CAPSULE | Freq: Every day | ORAL | Status: DC

## 2015-03-18 MED ORDER — ZOLPIDEM TARTRATE 10 MG PO TABS: ORAL_TABLET | ORAL | Status: DC

## 2015-03-18 NOTE — Patient Instructions (Signed)

## 2015-03-18 NOTE — Telephone Encounter (Signed)
Pt request to speak to assistant concern pain medication. She has a question. Please call her

## 2015-03-18 NOTE — Progress Notes (Signed)
Subjective:    Patient ID: Sherri Weber, female    DOB: 08-20-1933, 81 y.o.   MRN: [id number]  HPI Comments: She returns for f/up - cellulitis over left foot is better after one week of bactrim. Right hip is better after ORIF for fracture that was caused by her sitting down. She also complains of depression, anhedonia, loss of appetite, weight loss, fatigue, and insomnia.     Review of Systems  Constitutional: Positive for fatigue. Negative for fever, chills, diaphoresis and appetite change.  HENT: Negative.   Eyes: Negative.   Respiratory: Negative.  Negative for cough, choking, chest tightness, shortness of breath and stridor.   Cardiovascular: Negative.  Negative for chest pain, palpitations and leg swelling.  Gastrointestinal: Negative.  Negative for nausea, abdominal pain, diarrhea, constipation and blood in stool.  Endocrine: Negative.   Genitourinary: Negative.   Musculoskeletal: Positive for arthralgias. Negative for myalgias, back pain, joint swelling and neck pain.  Skin: Positive for wound (small ulcer on left foot). Negative for color change, pallor and rash.  Allergic/Immunologic: Negative.   Neurological: Negative.  Negative for dizziness, tremors, syncope, light-headedness, numbness and headaches.  Hematological: Negative.  Negative for adenopathy. Does not bruise/bleed easily.  Psychiatric/Behavioral: Negative.        Objective:   Physical Exam  Constitutional: She is oriented to person, place, and time. She appears well-developed and well-nourished. No distress.  HENT:  Head: Normocephalic and atraumatic.  Mouth/Throat: Oropharynx is clear and moist. No oropharyngeal exudate.  Eyes: Conjunctivae are normal. Right eye exhibits no discharge. Left eye exhibits no discharge. No scleral icterus.  Neck: Normal range of motion. Neck supple. No JVD present. No tracheal deviation present. No thyromegaly present.  Cardiovascular: Normal rate, regular rhythm, normal  heart sounds and intact distal pulses.  Exam reveals no gallop and no friction rub.   No murmur heard. Pulmonary/Chest: Effort normal and breath sounds normal. No stridor. No respiratory distress. She has no wheezes. She has no rales. She exhibits no tenderness.  Abdominal: Soft. Bowel sounds are normal. She exhibits no distension and no mass. There is no tenderness. There is no rebound and no guarding.  Musculoskeletal: Normal range of motion. She exhibits edema (trace edema in BLE). She exhibits no tenderness.       Left foot: There is deformity. There is normal range of motion, no tenderness, no bony tenderness, no swelling, normal capillary refill, no crepitus and no laceration.       Feet:  Lymphadenopathy:    She has no cervical adenopathy.  Neurological: She is oriented to person, place, and time.  Skin: Skin is warm and dry. No rash noted. She is not diaphoretic. No erythema. No pallor.  Psychiatric: She has a normal mood and affect. Her behavior is normal. Judgment and thought content normal.  Vitals reviewed.    Lab Results  Component Value Date   WBC 5.8 07/30/2013   HGB 12.5 07/30/2013   HCT 37.2 07/30/2013   PLT 262.0 07/30/2013   GLUCOSE 112* 12/08/2014   CHOL 218* 12/08/2014   TRIG 368.0* 12/08/2014   HDL 31.00* 12/08/2014   LDLDIRECT 112.6 12/08/2014   LDLCALC 122* 02/24/2014   ALT 12 12/08/2014   AST 16 12/08/2014   NA 140 12/08/2014   K 4.4 12/08/2014   CL 104 12/08/2014   CREATININE 0.9 12/08/2014   BUN 18 12/08/2014   CO2 27 12/08/2014   TSH 2.57 12/08/2014   INR 0.95 04/28/2013  Assessment & Plan:

## 2015-03-18 NOTE — Progress Notes (Signed)
Pre visit review using our clinic review tool, if applicable. No additional management support is needed unless otherwise documented below in the visit note. 

## 2015-03-19 DIAGNOSIS — S72141D Displaced intertrochanteric fracture of right femur, subsequent encounter for closed fracture with routine healing: Secondary | ICD-10-CM | POA: Diagnosis not present

## 2015-03-19 NOTE — Assessment & Plan Note (Addendum)
She has had a very low impact fracture in her right hip She needs to have a BMD done Will consider treatment options based on the results

## 2015-03-19 NOTE — Assessment & Plan Note (Signed)
Her BP is well controlled 

## 2015-03-19 NOTE — Assessment & Plan Note (Signed)
Will start fetzima She was given samples, will start at 20 mg per day then will increase to 40 mg per day Will recheck her in about 4-6 weeks and may consider an increase to 80 mg per day

## 2015-03-23 ENCOUNTER — Telehealth: Payer: Self-pay | Admitting: Internal Medicine

## 2015-03-23 DIAGNOSIS — M8949 Other hypertrophic osteoarthropathy, multiple sites: Secondary | ICD-10-CM

## 2015-03-23 DIAGNOSIS — M5440 Lumbago with sciatica, unspecified side: Secondary | ICD-10-CM

## 2015-03-23 DIAGNOSIS — S32009D Unspecified fracture of unspecified lumbar vertebra, subsequent encounter for fracture with routine healing: Secondary | ICD-10-CM

## 2015-03-23 DIAGNOSIS — M150 Primary generalized (osteo)arthritis: Secondary | ICD-10-CM

## 2015-03-23 DIAGNOSIS — M159 Polyosteoarthritis, unspecified: Secondary | ICD-10-CM

## 2015-03-23 NOTE — Telephone Encounter (Signed)
Patient came in on Weds. 03/23 and stated that Dr. Yetta BarreJones was to fill a prescription for pain med. Office visit note seems to say the opposite. Please call patient to clarify.  I also scheduled an appt for 03/30. She said she needs to see about going to see a GI dr.

## 2015-03-24 MED ORDER — OXYCODONE HCL 5 MG PO CAPS: 5.0000 mg | ORAL_CAPSULE | Freq: Four times a day (QID) | ORAL | Status: DC | PRN

## 2015-03-24 NOTE — Telephone Encounter (Signed)
Patient calling back for same issue

## 2015-03-24 NOTE — Telephone Encounter (Signed)
Spoke with patient who said that at last office visit MD advise that he would refill her oxycodone 5 mg if she needed him to. Patient only has enough for today and is requesting a refill.

## 2015-03-24 NOTE — Telephone Encounter (Signed)
Patient notified to pick up 

## 2015-03-24 NOTE — Telephone Encounter (Signed)
done

## 2015-03-25 ENCOUNTER — Ambulatory Visit: Payer: Commercial Managed Care - HMO | Admitting: Internal Medicine

## 2015-03-26 ENCOUNTER — Telehealth: Payer: Self-pay | Admitting: Internal Medicine

## 2015-03-26 ENCOUNTER — Telehealth: Payer: Self-pay

## 2015-03-26 NOTE — Telephone Encounter (Signed)
Ask her to stop taking this

## 2015-03-26 NOTE — Telephone Encounter (Signed)
[facility] RN called and requested to stop OT until GI issues pt is having subsides.  Spoke to PCP prior to accepting the call.  PCP stated it was okay.  Spoke to RN with [facility] and gave MD okay to d/c OT until GI issue resides.

## 2015-03-26 NOTE — Telephone Encounter (Signed)
Patient was just prescribed amLODipine (NORVASC) 10 MG tablet [[id number]][[id number]]. It is costly and wondering if there is a cheaper alternative. She also seems to have a pretty bad stomach bug also. Please advise.

## 2015-03-27 NOTE — Telephone Encounter (Signed)
Per tamara w spoke with pt yesterday. Closing encounter...lmb

## 2015-03-31 ENCOUNTER — Telehealth: Payer: Self-pay

## 2015-03-31 DIAGNOSIS — F418 Other specified anxiety disorders: Secondary | ICD-10-CM

## 2015-03-31 NOTE — Telephone Encounter (Signed)
Sherri HammondWendy Wrenn, RN called requesting verbal orders to continue with PT, OT, and Home Health. She also wanted MD to know that pt was given Rx and samples of Fetzima 40 mg, however cost of Rx is 300$ and pt unable to afford.

## 2015-03-31 NOTE — Telephone Encounter (Signed)
Returned call back to patient, no answer, will try again later.

## 2015-03-31 NOTE — Telephone Encounter (Signed)
Ok with the request Stop fetzima, does she want to try a different antidepressant

## 2015-04-01 MED ORDER — DULOXETINE HCL 30 MG PO CPEP: 30.0000 mg | ORAL_CAPSULE | Freq: Every day | ORAL | Status: DC

## 2015-04-01 NOTE — Telephone Encounter (Signed)
Rx sent 

## 2015-04-01 NOTE — Telephone Encounter (Signed)
Patient returned your call.

## 2015-04-01 NOTE — Telephone Encounter (Signed)
Spoke with patient who would like to try something different. Thanks

## 2015-04-01 NOTE — Telephone Encounter (Signed)
Notified home health nurse

## 2015-04-01 NOTE — Telephone Encounter (Signed)
Returned call back to patient x 2//LMOVM to call back.

## 2015-04-01 NOTE — Addendum Note (Signed)
Addended by: Etta GrandchildJONES, Adelyne Marchese L on: 04/01/2015 01:36 PM   Modules accepted: Orders

## 2015-04-03 ENCOUNTER — Telehealth: Payer: Self-pay | Admitting: Internal Medicine

## 2015-04-03 NOTE — Telephone Encounter (Signed)
error 

## 2015-04-06 ENCOUNTER — Telehealth: Payer: Self-pay | Admitting: Internal Medicine

## 2015-04-06 NOTE — Telephone Encounter (Signed)
Toniann FailWendy with [phone]   Orders to draw CMP and CBC.  Pt is dehydrated

## 2015-04-07 LAB — CBC AND DIFFERENTIAL
HCT: 32 % — AB (ref 36–46)
Hemoglobin: 10.6 g/dL — AB (ref 12.0–16.0)
Platelets: 203 10*3/uL (ref 150–399)
WBC: 7 10^3/mL

## 2015-04-07 LAB — HEPATIC FUNCTION PANEL
ALT: 13 U/L (ref 7–35)
AST: 14 U/L (ref 13–35)
Alkaline Phosphatase: 60 U/L (ref 25–125)
Bilirubin, Total: 0.2 mg/dL

## 2015-04-07 LAB — BASIC METABOLIC PANEL
BUN: 15 mg/dL (ref 4–21)
Creatinine: 0.6 mg/dL (ref 0.5–1.1)
Glucose: 91 mg/dL
Potassium: 4 mmol/L (ref 3.4–5.3)
Sodium: 137 mmol/L (ref 137–147)

## 2015-04-07 NOTE — Telephone Encounter (Signed)
Spoke with Toniann FailWendy, RN who feels that pt is not dehydrated and states that the dehydration is what the patient has said. Per Toniann FailWendy, pt is drinking fluids and stools are better, not as watery as before. They will collect labs and contact us back or have pt to be seen.

## 2015-04-07 NOTE — Telephone Encounter (Signed)
If they know that she is dehydrated then please get to be seen somewhere that can evaluate her and give IVF's

## 2015-04-07 NOTE — Telephone Encounter (Signed)
Toniann FailWendy is calling back about this. She is scheduled to see her early this afternoon and would like to have the lab orders ready. Patient is not feeling well

## 2015-04-09 ENCOUNTER — Telehealth: Payer: Self-pay | Admitting: *Deleted

## 2015-04-09 ENCOUNTER — Telehealth: Payer: Self-pay | Admitting: Internal Medicine

## 2015-04-09 NOTE — Telephone Encounter (Signed)
Notified Juelz with md response...[name]/lmb

## 2015-04-09 NOTE — Telephone Encounter (Signed)
There is nothing concerning about these labs

## 2015-04-09 NOTE — Telephone Encounter (Signed)
Toniann FailWendy notified per MD

## 2015-04-09 NOTE — Telephone Encounter (Signed)
Left msg on triage stating went out to reassessment today. Needing verbal order to see pt 2 x's a week for 3 wks...[name]/lmb

## 2015-04-09 NOTE — Telephone Encounter (Signed)
Per Toniann FailWendy, RN, lab resulted back a hgb of 10.6, HCt 32.2, calcium 8.5 and albumin 8.3. Patient has appointment sent for Monday unless you feel as though she needs to be seen sooner. Please advise

## 2015-04-09 NOTE — Telephone Encounter (Signed)
Since labs are back so she wants to know what Dr. Yetta BarreJones wants to do next. Please call Toniann FailWendy back. I did go ahead and make an appt for her on Mon at 2pm

## 2015-04-09 NOTE — Telephone Encounter (Signed)
ok 

## 2015-04-13 ENCOUNTER — Encounter: Payer: Self-pay | Admitting: Internal Medicine

## 2015-04-13 ENCOUNTER — Ambulatory Visit (INDEPENDENT_AMBULATORY_CARE_PROVIDER_SITE_OTHER): Payer: Commercial Managed Care - HMO | Admitting: Internal Medicine

## 2015-04-13 ENCOUNTER — Ambulatory Visit: Payer: Commercial Managed Care - HMO | Admitting: Internal Medicine

## 2015-04-13 VITALS — BP 108/58 | HR 80 | Temp 98.7°F | Resp 16 | Wt 132.0 lb

## 2015-04-13 DIAGNOSIS — I10 Essential (primary) hypertension: Secondary | ICD-10-CM

## 2015-04-13 DIAGNOSIS — K9419 Other complications of enterostomy: Secondary | ICD-10-CM | POA: Diagnosis not present

## 2015-04-13 DIAGNOSIS — F418 Other specified anxiety disorders: Secondary | ICD-10-CM | POA: Diagnosis not present

## 2015-04-13 DIAGNOSIS — I872 Venous insufficiency (chronic) (peripheral): Secondary | ICD-10-CM | POA: Diagnosis not present

## 2015-04-13 MED ORDER — SACCHAROMYCES BOULARDII 250 MG PO CAPS: 250.0000 mg | ORAL_CAPSULE | Freq: Two times a day (BID) | ORAL | Status: DC

## 2015-04-13 MED ORDER — LORAZEPAM 0.5 MG PO TABS: 0.5000 mg | ORAL_TABLET | Freq: Two times a day (BID) | ORAL | Status: DC | PRN

## 2015-04-13 NOTE — Patient Instructions (Signed)

## 2015-04-13 NOTE — Progress Notes (Signed)
Pre visit review using our clinic review tool, if applicable. No additional management support is needed unless otherwise documented below in the visit note. 

## 2015-04-13 NOTE — Progress Notes (Signed)
Subjective:    Patient ID: Sherri Weber, female    DOB: [birth year], 81 y.o.   MRN: [id number]  HPI Comments: She returns for f/up and tells me that she saw a GI in [place] a few weeks ago and was told that she has C diff infection and has been on metronidazole for 10 days, she has not felt well over the last week with nausea, loss of appetite, and dizziness.  Hypertension This is a chronic problem. The current episode started more than 1 year ago. The problem has been resolved since onset. The problem is controlled. Associated symptoms include anxiety ("xanax is not helping, I want ativan"), malaise/fatigue and peripheral edema. Pertinent negatives include no blurred vision, chest pain, headaches, neck pain, orthopnea, palpitations, PND, shortness of breath or sweats. There are no associated agents to hypertension. Past treatments include angiotensin blockers and diuretics. The current treatment provides significant improvement. There are no compliance problems.       Review of Systems  Constitutional: Positive for malaise/fatigue, appetite change and fatigue. Negative for fever, chills, diaphoresis, activity change and unexpected weight change.  HENT: Negative.  Negative for trouble swallowing and voice change.   Eyes: Negative.  Negative for blurred vision.  Respiratory: Negative.  Negative for cough, choking, chest tightness, shortness of breath and stridor.   Cardiovascular: Negative.  Negative for chest pain, palpitations, orthopnea, leg swelling and PND.  Gastrointestinal: Negative.  Negative for nausea, vomiting, abdominal pain, diarrhea, constipation and blood in stool.  Endocrine: Negative.   Genitourinary: Negative.   Musculoskeletal: Positive for back pain and arthralgias. Negative for myalgias and neck pain.  Skin: Negative.  Negative for rash.  Allergic/Immunologic: Negative.   Neurological: Positive for dizziness and light-headedness. Negative for facial asymmetry  and headaches.  Hematological: Negative.  Negative for adenopathy. Does not bruise/bleed easily.  Psychiatric/Behavioral: Positive for sleep disturbance and dysphoric mood. Negative for suicidal ideas, hallucinations, behavioral problems, confusion, self-injury, decreased concentration and agitation. The patient is nervous/anxious. The patient is not hyperactive.        Objective:   Physical Exam  Constitutional: She is oriented to person, place, and time. She appears well-developed and well-nourished.  Non-toxic appearance. She does not have a sickly appearance. She does not appear ill. No distress.  HENT:  Head: Normocephalic and atraumatic.  Mouth/Throat: Oropharynx is clear and moist. No oropharyngeal exudate.  Eyes: Conjunctivae are normal. Right eye exhibits no discharge. Left eye exhibits no discharge. No scleral icterus.  Neck: Normal range of motion. Neck supple. No JVD present. No tracheal deviation present. No thyromegaly present.  Cardiovascular: Normal rate, regular rhythm, normal heart sounds and intact distal pulses.  Exam reveals no gallop and no friction rub.   No murmur heard. Pulmonary/Chest: Effort normal and breath sounds normal. No stridor. No respiratory distress. She has no wheezes. She has no rales. She exhibits no tenderness.  Abdominal: Soft. Bowel sounds are normal. She exhibits no distension and no mass. There is no tenderness. There is no rebound and no guarding.  Musculoskeletal: Normal range of motion. She exhibits edema (1+ pitting edema in BLE). She exhibits no tenderness.  Lymphadenopathy:    She has no cervical adenopathy.  Neurological: She is oriented to person, place, and time.  Skin: Skin is warm and dry. No rash noted. She is not diaphoretic. No erythema. No pallor.  Vitals reviewed.   Lab Results  Component Value Date   WBC 7.0 04/07/2015   HGB 10.6* 04/07/2015   HCT 32*  04/07/2015   PLT 203 04/07/2015   GLUCOSE 112* 12/08/2014   CHOL 218*  12/08/2014   TRIG 368.0* 12/08/2014   HDL 31.00* 12/08/2014   LDLDIRECT 112.6 12/08/2014   LDLCALC 122* 02/24/2014   ALT 13 04/07/2015   AST 14 04/07/2015   NA 137 04/07/2015   K 4.0 04/07/2015   CL 104 12/08/2014   CREATININE 0.6 04/07/2015   BUN 15 04/07/2015   CO2 27 12/08/2014   TSH 2.57 12/08/2014   INR 0.95 04/28/2013       Assessment & Plan:

## 2015-04-14 ENCOUNTER — Encounter: Payer: Self-pay | Admitting: Internal Medicine

## 2015-04-14 NOTE — Assessment & Plan Note (Signed)
Will change xanax to ativan at her request

## 2015-04-14 NOTE — Assessment & Plan Note (Signed)
Start a probiotic to help with the C diff recover and to normalize the BM;s

## 2015-04-14 NOTE — Assessment & Plan Note (Signed)
This is at her baseline She was reminded to elevate her LE's frequently

## 2015-04-14 NOTE — Assessment & Plan Note (Signed)
Her BP is low and she is symptomatic Will hold the ARB/HCTZ combo for now

## 2015-04-15 ENCOUNTER — Telehealth: Payer: Self-pay | Admitting: Internal Medicine

## 2015-04-15 NOTE — Telephone Encounter (Signed)
Patient still isn't doing well bp is 96/54. She was here the other day and forgotten to ask that she would like the HYDROcodone-acetaminophen (NORCO) 7.5-325 MG per tablet [[id number]][[id number]]  Instead of oxycodone (OXY-IR) 5 MG capsule [[id number] for pain. She hasn't been taking it.

## 2015-04-15 NOTE — Telephone Encounter (Signed)
no

## 2015-04-16 NOTE — Telephone Encounter (Signed)
Spoke with Toniann FailWendy and advised per MD. Patient has completed medication regimen for Cdiff, however still states that she is not feeling well. Patient has appointment with GI Friday 04/17/15 for follow up.

## 2015-04-23 ENCOUNTER — Telehealth: Payer: Self-pay | Admitting: Internal Medicine

## 2015-04-23 NOTE — Telephone Encounter (Signed)
no

## 2015-04-23 NOTE — Telephone Encounter (Signed)
Patient is requesting refill for oxycodone (OXY-IR) 5 MG capsule [[id number] and her friend Cam Stevie KernDiggs will pick them up for her.

## 2015-04-27 ENCOUNTER — Encounter: Payer: Self-pay | Admitting: Internal Medicine

## 2015-04-28 ENCOUNTER — Other Ambulatory Visit: Payer: Self-pay | Admitting: Internal Medicine

## 2015-04-29 ENCOUNTER — Ambulatory Visit: Payer: Commercial Managed Care - HMO | Admitting: Internal Medicine

## 2015-04-30 ENCOUNTER — Ambulatory Visit (INDEPENDENT_AMBULATORY_CARE_PROVIDER_SITE_OTHER): Payer: Commercial Managed Care - HMO | Admitting: Internal Medicine

## 2015-04-30 ENCOUNTER — Other Ambulatory Visit (INDEPENDENT_AMBULATORY_CARE_PROVIDER_SITE_OTHER): Payer: Commercial Managed Care - HMO

## 2015-04-30 ENCOUNTER — Encounter: Payer: Self-pay | Admitting: Internal Medicine

## 2015-04-30 VITALS — BP 138/80 | HR 80 | Temp 97.8°F | Resp 16 | Ht 65.5 in | Wt 130.0 lb

## 2015-04-30 DIAGNOSIS — D519 Vitamin B12 deficiency anemia, unspecified: Secondary | ICD-10-CM | POA: Diagnosis not present

## 2015-04-30 DIAGNOSIS — M150 Primary generalized (osteo)arthritis: Secondary | ICD-10-CM

## 2015-04-30 DIAGNOSIS — M545 Low back pain: Secondary | ICD-10-CM

## 2015-04-30 DIAGNOSIS — I10 Essential (primary) hypertension: Secondary | ICD-10-CM

## 2015-04-30 DIAGNOSIS — F418 Other specified anxiety disorders: Secondary | ICD-10-CM

## 2015-04-30 DIAGNOSIS — M159 Polyosteoarthritis, unspecified: Secondary | ICD-10-CM

## 2015-04-30 DIAGNOSIS — K9419 Other complications of enterostomy: Secondary | ICD-10-CM

## 2015-04-30 DIAGNOSIS — E781 Pure hyperglyceridemia: Secondary | ICD-10-CM

## 2015-04-30 DIAGNOSIS — R7989 Other specified abnormal findings of blood chemistry: Secondary | ICD-10-CM | POA: Diagnosis not present

## 2015-04-30 DIAGNOSIS — M8949 Other hypertrophic osteoarthropathy, multiple sites: Secondary | ICD-10-CM

## 2015-04-30 LAB — CBC WITH DIFFERENTIAL/PLATELET
Basophils Absolute: 0 10*3/uL (ref 0.0–0.1)
Basophils Relative: 0.6 % (ref 0.0–3.0)
Eosinophils Absolute: 0.2 10*3/uL (ref 0.0–0.7)
Eosinophils Relative: 3.7 % (ref 0.0–5.0)
HCT: 37 % (ref 36.0–46.0)
Hemoglobin: 12.6 g/dL (ref 12.0–15.0)
Lymphocytes Relative: 18.9 % (ref 12.0–46.0)
Lymphs Abs: 1 10*3/uL (ref 0.7–4.0)
MCHC: 34 g/dL (ref 30.0–36.0)
MCV: 87.9 fl (ref 78.0–100.0)
Monocytes Absolute: 0.5 10*3/uL (ref 0.1–1.0)
Monocytes Relative: 8.7 % (ref 3.0–12.0)
Neutro Abs: 3.8 10*3/uL (ref 1.4–7.7)
Neutrophils Relative %: 68.1 % (ref 43.0–77.0)
Platelets: 227 10*3/uL (ref 150.0–400.0)
RBC: 4.21 Mil/uL (ref 3.87–5.11)
RDW: 15.3 % (ref 11.5–15.5)
WBC: 5.5 10*3/uL (ref 4.0–10.5)

## 2015-04-30 LAB — BASIC METABOLIC PANEL
BUN: 11 mg/dL (ref 6–23)
CO2: 29 mEq/L (ref 19–32)
Calcium: 9.1 mg/dL (ref 8.4–10.5)
Chloride: 106 mEq/L (ref 96–112)
Creatinine, Ser: 0.92 mg/dL (ref 0.40–1.20)
GFR: 62.43 mL/min (ref >60.00)
Glucose, Bld: 83 mg/dL (ref 70–99)
Potassium: 4.5 mEq/L (ref 3.5–5.1)
Sodium: 140 mEq/L (ref 135–145)

## 2015-04-30 LAB — LIPID PANEL
Cholesterol: 145 mg/dL (ref 0–200)
HDL: 23.4 mg/dL — ABNORMAL LOW (ref >39.00)
NonHDL: 121.6
Total CHOL/HDL Ratio: 6
Triglycerides: 276 mg/dL — ABNORMAL HIGH (ref 0.0–149.0)
VLDL: 55.2 mg/dL — ABNORMAL HIGH (ref 0.0–40.0)

## 2015-04-30 LAB — IBC PANEL
Iron: 73 ug/dL (ref 42–145)
Saturation Ratios: 23.8 % (ref 20.0–50.0)
Transferrin: 219 mg/dL (ref 212.0–360.0)

## 2015-04-30 LAB — FERRITIN: Ferritin: 45.4 ng/mL (ref 10.0–291.0)

## 2015-04-30 LAB — FOLATE: Folate: 12.7 ng/mL (ref >5.9)

## 2015-04-30 LAB — LDL CHOLESTEROL, DIRECT: Direct LDL: 75 mg/dL

## 2015-04-30 LAB — VITAMIN B12: Vitamin B-12: 426 pg/mL (ref 211–911)

## 2015-04-30 MED ORDER — OXYCODONE-ACETAMINOPHEN 10-325 MG PO TABS: 1.0000 | ORAL_TABLET | Freq: Three times a day (TID) | ORAL | Status: DC | PRN

## 2015-04-30 MED ORDER — DULOXETINE HCL 60 MG PO CPEP: 60.0000 mg | ORAL_CAPSULE | Freq: Every day | ORAL | Status: DC

## 2015-04-30 NOTE — Progress Notes (Signed)
   Subjective:    Patient ID: Sherri Weber, female    DOB: [birth year], 81 y.o.   MRN: [id number]  Hypertension This is a chronic problem. The current episode started more than 1 year ago. The problem is unchanged. The problem is controlled. Associated symptoms include anxiety and malaise/fatigue. Pertinent negatives include no blurred vision, chest pain, headaches, neck pain, orthopnea, palpitations, peripheral edema, PND, shortness of breath or sweats. The current treatment provides moderate improvement. There are no compliance problems.       Review of Systems  Constitutional: Positive for malaise/fatigue. Negative for fever, chills, diaphoresis, appetite change and fatigue.  HENT: Negative.  Negative for trouble swallowing.   Eyes: Negative.  Negative for blurred vision.  Respiratory: Negative.  Negative for cough, choking, chest tightness, shortness of breath and stridor.   Cardiovascular: Negative.  Negative for chest pain, palpitations, orthopnea, leg swelling and PND.  Gastrointestinal: Positive for constipation. Negative for nausea, vomiting, abdominal pain, diarrhea and blood in stool.  Endocrine: Negative.   Genitourinary: Negative.   Musculoskeletal: Positive for back pain and arthralgias. Negative for myalgias and neck pain.  Skin: Negative.  Negative for rash.  Allergic/Immunologic: Negative.   Neurological: Negative.  Negative for dizziness, tremors, syncope, light-headedness and headaches.  Hematological: Negative.  Negative for adenopathy. Does not bruise/bleed easily.  Psychiatric/Behavioral: Positive for sleep disturbance and dysphoric mood. Negative for suicidal ideas, hallucinations, behavioral problems, confusion, self-injury and agitation. The patient is nervous/anxious. The patient is not hyperactive.        Objective:   Physical Exam  Constitutional: She is oriented to person, place, and time. She appears well-developed and well-nourished. No distress.    HENT:  Head: Normocephalic and atraumatic.  Mouth/Throat: Oropharynx is clear and moist. No oropharyngeal exudate.  Eyes: Conjunctivae are normal. Right eye exhibits no discharge. Left eye exhibits no discharge. No scleral icterus.  Neck: Normal range of motion. Neck supple. No JVD present. No tracheal deviation present. No thyromegaly present.  Cardiovascular: Normal rate, regular rhythm, normal heart sounds and intact distal pulses.  Exam reveals no gallop and no friction rub.   No murmur heard. Pulmonary/Chest: Effort normal and breath sounds normal. No stridor. No respiratory distress. She has no wheezes. She has no rales. She exhibits no tenderness.  Abdominal: Soft. Bowel sounds are normal. She exhibits no distension and no mass. There is no tenderness. There is no rebound and no guarding.  Musculoskeletal: Normal range of motion. She exhibits no edema or tenderness.  Lymphadenopathy:    She has no cervical adenopathy.  Neurological: She is oriented to person, place, and time.  Skin: Skin is warm and dry. No rash noted. She is not diaphoretic. No erythema. No pallor.  Vitals reviewed.    Lab Results  Component Value Date   WBC 5.5 04/30/2015   HGB 12.6 04/30/2015   HCT 37.0 04/30/2015   PLT 227.0 04/30/2015   GLUCOSE 83 04/30/2015   CHOL 145 04/30/2015   TRIG 276.0* 04/30/2015   HDL 23.40* 04/30/2015   LDLDIRECT 75.0 04/30/2015   LDLCALC 122* 02/24/2014   ALT 13 04/07/2015   AST 14 04/07/2015   NA 140 04/30/2015   K 4.5 04/30/2015   CL 106 04/30/2015   CREATININE 0.92 04/30/2015   BUN 11 04/30/2015   CO2 29 04/30/2015   TSH 2.57 12/08/2014   INR 0.95 04/28/2013       Assessment & Plan:

## 2015-04-30 NOTE — Patient Instructions (Signed)
Anemia, Nonspecific Anemia is a condition in which the concentration of red blood cells or hemoglobin in the blood is below normal. Hemoglobin is a substance in red blood cells that carries oxygen to the tissues of the body. Anemia results in not enough oxygen reaching these tissues.  CAUSES  Common causes of anemia include:   Excessive bleeding. Bleeding may be internal or external. This includes excessive bleeding from periods (in women) or from the intestine.   Poor nutrition.   Chronic kidney, thyroid, and liver disease.  Bone marrow disorders that decrease red blood cell production.  Cancer and treatments for cancer.  HIV, AIDS, and their treatments.  Spleen problems that increase red blood cell destruction.  Blood disorders.  Excess destruction of red blood cells due to infection, medicines, and autoimmune disorders. SIGNS AND SYMPTOMS   Minor weakness.   Dizziness.   Headache.  Palpitations.   Shortness of breath, especially with exercise.   Paleness.  Cold sensitivity.  Indigestion.  Nausea.  Difficulty sleeping.  Difficulty concentrating. Symptoms may occur suddenly or they may develop slowly.  DIAGNOSIS  Additional blood tests are often needed. These help your health care provider determine the best treatment. Your health care provider will check your stool for blood and look for other causes of blood loss.  TREATMENT  Treatment varies depending on the cause of the anemia. Treatment can include:   Supplements of iron, vitamin B12, or folic acid.   Hormone medicines.   A blood transfusion. This may be needed if blood loss is severe.   Hospitalization. This may be needed if there is significant continual blood loss.   Dietary changes.  Spleen removal. HOME CARE INSTRUCTIONS Keep all follow-up appointments. It often takes many weeks to correct anemia, and having your health care provider check on your condition and your response to  treatment is very important. SEEK IMMEDIATE MEDICAL CARE IF:   You develop extreme weakness, shortness of breath, or chest pain.   You become dizzy or have trouble concentrating.  You develop heavy vaginal bleeding.   You develop a rash.   You have bloody or black, tarry stools.   You faint.   You vomit up blood.   You vomit repeatedly.   You have abdominal pain.  You have a fever or persistent symptoms for more than 2-3 days.   You have a fever and your symptoms suddenly get worse.   You are dehydrated.  MAKE SURE YOU:  Understand these instructions.  Will watch your condition.  Will get help right away if you are not doing well or get worse. Document Released: 01/19/2005 Document Revised: 08/14/2013 Document Reviewed: 06/07/2013 Exit[employer] Patient Information 2015 [employer]. This information is not intended to replace advice given to you by your health care provider. Make sure you discuss any questions you have with your health care provider.  

## 2015-04-30 NOTE — Progress Notes (Signed)
Pre visit review using our clinic review tool, if applicable. No additional management support is needed unless otherwise documented below in the visit note. 

## 2015-05-01 ENCOUNTER — Telehealth: Payer: Self-pay | Admitting: Internal Medicine

## 2015-05-01 NOTE — Telephone Encounter (Signed)
Going to do a re certification b/c patient is still having a lot of issues.

## 2015-05-02 NOTE — Assessment & Plan Note (Addendum)
Her BP is well controlled with no meds Will follow for now

## 2015-05-02 NOTE — Assessment & Plan Note (Signed)
Improvement noted in her H and H today

## 2015-05-02 NOTE — Assessment & Plan Note (Signed)
Her trigs are slightly elevated but do not need to be treated

## 2015-05-02 NOTE — Assessment & Plan Note (Signed)
She will cont percocet as needed for pain 

## 2015-05-02 NOTE — Assessment & Plan Note (Signed)
She complains of persistent s/s of this Will increase the cymbalta from 30 to 60 mg per day

## 2015-05-02 NOTE — Assessment & Plan Note (Signed)
She recently saw her GI and tells me that the c diff infection has resolved

## 2015-05-07 ENCOUNTER — Telehealth: Payer: Self-pay | Admitting: Internal Medicine

## 2015-05-07 NOTE — Telephone Encounter (Signed)
[facility] to [facility] with you that home care is ok to continue. Her phone # is [phone]

## 2015-05-10 NOTE — Telephone Encounter (Signed)
yes

## 2015-05-11 ENCOUNTER — Ambulatory Visit: Payer: Commercial Managed Care - HMO | Admitting: Internal Medicine

## 2015-05-11 NOTE — Telephone Encounter (Signed)
Left message with office staff to inform Sherri Weber that dr Yetta Barrejones has given orders to continue home care

## 2015-05-18 ENCOUNTER — Telehealth: Payer: Self-pay | Admitting: Internal Medicine

## 2015-05-18 NOTE — Telephone Encounter (Signed)
Notified June with md response...[name]/lmb

## 2015-05-18 NOTE — Telephone Encounter (Signed)
yes

## 2015-05-18 NOTE — Telephone Encounter (Signed)
[facility] [phone] [facility]   Extending OT  2 week 3 For Strentghing and balancing training

## 2015-05-20 ENCOUNTER — Other Ambulatory Visit: Payer: Self-pay | Admitting: Internal Medicine

## 2015-05-21 ENCOUNTER — Ambulatory Visit (INDEPENDENT_AMBULATORY_CARE_PROVIDER_SITE_OTHER): Payer: Commercial Managed Care - HMO | Admitting: Internal Medicine

## 2015-05-21 ENCOUNTER — Encounter: Payer: Self-pay | Admitting: Internal Medicine

## 2015-05-21 VITALS — BP 132/82 | HR 88 | Temp 98.1°F | Resp 16 | Ht 65.5 in | Wt 126.0 lb

## 2015-05-21 DIAGNOSIS — M545 Low back pain: Secondary | ICD-10-CM | POA: Diagnosis not present

## 2015-05-21 DIAGNOSIS — S32009K Unspecified fracture of unspecified lumbar vertebra, subsequent encounter for fracture with nonunion: Secondary | ICD-10-CM

## 2015-05-21 DIAGNOSIS — A047 Enterocolitis due to Clostridium difficile: Secondary | ICD-10-CM | POA: Diagnosis not present

## 2015-05-21 DIAGNOSIS — M150 Primary generalized (osteo)arthritis: Secondary | ICD-10-CM

## 2015-05-21 DIAGNOSIS — M8949 Other hypertrophic osteoarthropathy, multiple sites: Secondary | ICD-10-CM

## 2015-05-21 DIAGNOSIS — M159 Polyosteoarthritis, unspecified: Secondary | ICD-10-CM

## 2015-05-21 MED ORDER — OXYCODONE HCL 5 MG PO TABS: 5.0000 mg | ORAL_TABLET | ORAL | Status: DC | PRN

## 2015-05-21 NOTE — Progress Notes (Signed)
Pre visit review using our clinic review tool, if applicable. No additional management support is needed unless otherwise documented below in the visit note. 

## 2015-05-21 NOTE — Progress Notes (Signed)
Subjective:  Patient ID: Sherri Weber, female    DOB: 1933/11/16  Age: 81 y.o. MRN: [id number]  CC: Hypertension; Osteoarthritis; and Back Pain   HPI RONYA GILCREST presents for follow up, she asks that I change percocet to plain oxycodone, she thinks the APAP causes stomach upset.  Outpatient Prescriptions Prior to Visit  Medication Sig Dispense Refill  . DULoxetine (CYMBALTA) 60 MG capsule Take 1 capsule (60 mg total) by mouth daily. 30 capsule 11  . iron polysaccharides (NIFEREX) 150 MG capsule Take 150 mg by mouth daily.    Marland Kitchen LORazepam (ATIVAN) 0.5 MG tablet Take 1 tablet (0.5 mg total) by mouth 2 (two) times daily as needed for anxiety. 60 tablet 5  . oxybutynin (DITROPAN) 5 MG tablet Take 5 mg by mouth 2 (two) times daily.    Marland Kitchen oxyCODONE-acetaminophen (PERCOCET) 10-325 MG per tablet Take 1 tablet by mouth every 8 (eight) hours as needed. for pain 75 tablet 0  . polyethylene glycol (MIRALAX / GLYCOLAX) packet Take 17 g by mouth daily as needed (for constipation).    . saccharomyces boulardii (FLORASTOR) 250 MG capsule Take 1 capsule (250 mg total) by mouth 2 (two) times daily. 90 capsule 3  . zolpidem (AMBIEN) 10 MG tablet TAKE ONE TABLET AT BEDTIME AS NEEDED FOR SLEEP 30 tablet 3   No facility-administered medications prior to visit.    ROS Review of Systems  Constitutional: Negative.  Negative for fever, chills, diaphoresis, appetite change and fatigue.  HENT: Negative.   Eyes: Negative.   Respiratory: Negative.  Negative for cough, choking, chest tightness, shortness of breath and stridor.   Cardiovascular: Negative.  Negative for chest pain, palpitations and leg swelling.  Gastrointestinal: Negative.  Negative for nausea, vomiting, abdominal pain, diarrhea, constipation and blood in stool.  Endocrine: Negative.   Genitourinary: Negative.   Musculoskeletal: Positive for back pain and arthralgias. Negative for myalgias, joint swelling, gait problem, neck pain and  neck stiffness.  Skin: Negative.   Allergic/Immunologic: Negative.   Neurological: Negative.   Hematological: Negative.  Negative for adenopathy. Does not bruise/bleed easily.  Psychiatric/Behavioral: Positive for sleep disturbance. Negative for suicidal ideas, behavioral problems, confusion, self-injury, dysphoric mood, decreased concentration and agitation. The patient is nervous/anxious. The patient is not hyperactive.     Objective:  BP 132/82 mmHg  Pulse 88  Temp(Src) 98.1 F (36.7 C) (Oral)  Resp 16  Ht 5' 5.5" (1.664 m)  Wt 126 lb (57.153 kg)  BMI 20.64 kg/m2  SpO2 94%  BP Readings from Last 3 Encounters:  05/21/15 132/82  04/30/15 138/80  04/13/15 108/58    Wt Readings from Last 3 Encounters:  05/21/15 126 lb (57.153 kg)  04/30/15 130 lb (58.968 kg)  04/13/15 132 lb (59.875 kg)    Physical Exam  Constitutional: She is oriented to person, place, and time. She appears well-developed and well-nourished. No distress.  HENT:  Head: Normocephalic and atraumatic.  Mouth/Throat: Oropharynx is clear and moist. No oropharyngeal exudate.  Eyes: Conjunctivae are normal. Right eye exhibits no discharge. Left eye exhibits no discharge. No scleral icterus.  Neck: Normal range of motion. Neck supple. No JVD present. No tracheal deviation present. No thyromegaly present.  Cardiovascular: Normal rate, regular rhythm, normal heart sounds and intact distal pulses.  Exam reveals no gallop and no friction rub.   No murmur heard. Pulmonary/Chest: Effort normal. No stridor. No respiratory distress. She has no wheezes. She has no rales. She exhibits no tenderness.  Abdominal: Soft. Bowel  sounds are normal. She exhibits no distension and no mass. There is no tenderness. There is no rebound and no guarding.  Musculoskeletal: Normal range of motion. She exhibits no edema or tenderness.  Lymphadenopathy:    She has no cervical adenopathy.  Neurological: She is oriented to person, place, and  time.  Skin: Skin is warm and dry. No rash noted. She is not diaphoretic. No erythema. No pallor.  Vitals reviewed.   Lab Results  Component Value Date   WBC 5.5 04/30/2015   HGB 12.6 04/30/2015   HCT 37.0 04/30/2015   PLT 227.0 04/30/2015   GLUCOSE 83 04/30/2015   CHOL 145 04/30/2015   TRIG 276.0* 04/30/2015   HDL 23.40* 04/30/2015   LDLDIRECT 75.0 04/30/2015   LDLCALC 122* 02/24/2014   ALT 13 04/07/2015   AST 14 04/07/2015   NA 140 04/30/2015   K 4.5 04/30/2015   CL 106 04/30/2015   CREATININE 0.92 04/30/2015   BUN 11 04/30/2015   CO2 29 04/30/2015   TSH 2.57 12/08/2014   INR 0.95 04/28/2013    Dg Chest 2 View  05/08/2014   CLINICAL DATA:  Left-sided pain post fall  EXAM: CHEST  2 VIEW  COMPARISON:  04/28/2013.  FINDINGS: Cardiomediastinal silhouette is stable. Bronchiectasis right infrahilar region again noted. No acute infiltrate or pulmonary edema. There is cortical step-off left sixth rib suspicious for fracture. Clinical correlation is necessary. No pneumothorax. Mild degenerative changes thoracic spine.  IMPRESSION: Bronchiectasis right infrahilar region again noted. No acute infiltrate or pulmonary edema. There is cortical step-off left sixth rib suspicious for fracture. Clinical correlation is necessary. No pneumothorax. Mild degenerative changes thoracic spine.   Electronically Signed   By: Natasha MeadLiviu  Pop M.D.   On: 05/08/2014 09:14    Assessment & Plan:   Talbert ForestShirley was seen today for hypertension, osteoarthritis and back pain.  Diagnoses and all orders for this visit:  Lumbar vertebral fracture, with nonunion, subsequent encounter Orders: -     oxyCODONE (OXY IR/ROXICODONE) 5 MG immediate release tablet; Take 1 tablet (5 mg total) by mouth every 4 (four) hours as needed for severe pain.  Primary osteoarthritis involving multiple joints Orders: -     oxyCODONE (OXY IR/ROXICODONE) 5 MG immediate release tablet; Take 1 tablet (5 mg total) by mouth every 4 (four) hours  as needed for severe pain.  Low back pain without sciatica, unspecified back pain laterality Orders: -     oxyCODONE (OXY IR/ROXICODONE) 5 MG immediate release tablet; Take 1 tablet (5 mg total) by mouth every 4 (four) hours as needed for severe pain.   I am having Ms. Mccrone start on oxyCODONE. I am also having her maintain her polyethylene glycol, oxybutynin, iron polysaccharides, zolpidem, LORazepam, saccharomyces boulardii, oxyCODONE-acetaminophen, and DULoxetine.  Meds ordered this encounter  Medications  . oxyCODONE (OXY IR/ROXICODONE) 5 MG immediate release tablet    Sig: Take 1 tablet (5 mg total) by mouth every 4 (four) hours as needed for severe pain.    Dispense:  60 tablet    Refill:  0     Follow-up: Return in about 6 months (around 11/21/2015).  Sanda Lingerhomas Jerrelle Michelsen, MD

## 2015-05-21 NOTE — Patient Instructions (Signed)

## 2015-05-28 ENCOUNTER — Telehealth: Payer: Self-pay | Admitting: Internal Medicine

## 2015-05-28 NOTE — Telephone Encounter (Signed)
Noted  

## 2015-05-28 NOTE — Telephone Encounter (Signed)
Patient fell in home on Tuesday even and was taken to the ER [facility]).  She has three staples in back of head.  In process of making appointment to follow up with Dr. Yetta BarreJones to get staples removed.

## 2015-05-29 ENCOUNTER — Encounter: Payer: Self-pay | Admitting: Internal Medicine

## 2015-06-03 ENCOUNTER — Ambulatory Visit (INDEPENDENT_AMBULATORY_CARE_PROVIDER_SITE_OTHER): Payer: Commercial Managed Care - HMO | Admitting: Internal Medicine

## 2015-06-03 ENCOUNTER — Encounter: Payer: Self-pay | Admitting: Internal Medicine

## 2015-06-03 VITALS — BP 146/80 | HR 82 | Temp 97.8°F | Resp 20 | Ht 65.5 in | Wt 123.0 lb

## 2015-06-03 DIAGNOSIS — F418 Other specified anxiety disorders: Secondary | ICD-10-CM | POA: Diagnosis not present

## 2015-06-03 DIAGNOSIS — I10 Essential (primary) hypertension: Secondary | ICD-10-CM

## 2015-06-03 DIAGNOSIS — R296 Repeated falls: Secondary | ICD-10-CM | POA: Diagnosis not present

## 2015-06-03 NOTE — Patient Instructions (Signed)

## 2015-06-03 NOTE — Assessment & Plan Note (Signed)
She taking 2 BZD;s at the same time She would not give urine for a UDS today I am not certain is this is happening as a result of dementia or if she is intentionally abusing narcotics I have asked her to taper off of xanax and ativan

## 2015-06-03 NOTE — Progress Notes (Signed)
Subjective:  Patient ID: Sherri Weber, female    DOB: 1933-06-10  Age: 81 y.o. MRN: [id number]  CC: Hypertension and Wound Check   HPI Sherri Weber presents for staple removal, she fell a week ago and injured her scalp, went to an ER and had 3 staples placed. Her CT was normal. She has not had a headache but she feels poorly during the day with dizziness and staggering, she admits that she takes [place] in the morning sometimes and that she takes some of her sister's pain pills. Her recent UDS also revealed that she was taking both xanax and ativan at the same time though she told me that she had stopped taking xanax.  Outpatient Prescriptions Prior to Visit  Medication Sig Dispense Refill  . DULoxetine (CYMBALTA) 60 MG capsule Take 1 capsule (60 mg total) by mouth daily. 30 capsule 11  . iron polysaccharides (NIFEREX) 150 MG capsule Take 150 mg by mouth daily.    Marland Kitchen. oxybutynin (DITROPAN) 5 MG tablet Take 5 mg by mouth 2 (two) times daily.    Marland Kitchen. oxyCODONE-acetaminophen (PERCOCET) 10-325 MG per tablet Take 1 tablet by mouth every 8 (eight) hours as needed. for pain 75 tablet 0  . polyethylene glycol (MIRALAX / GLYCOLAX) packet Take 17 g by mouth daily as needed (for constipation).    . saccharomyces boulardii (FLORASTOR) 250 MG capsule Take 1 capsule (250 mg total) by mouth 2 (two) times daily. 90 capsule 3  . zolpidem (AMBIEN) 10 MG tablet TAKE ONE TABLET AT BEDTIME AS NEEDED FOR SLEEP 30 tablet 3  . LORazepam (ATIVAN) 0.5 MG tablet Take 1 tablet (0.5 mg total) by mouth 2 (two) times daily as needed for anxiety. 60 tablet 5  . oxyCODONE (OXY IR/ROXICODONE) 5 MG immediate release tablet Take 1 tablet (5 mg total) by mouth every 4 (four) hours as needed for severe pain. 60 tablet 0   No facility-administered medications prior to visit.    ROS Review of Systems  Constitutional: Negative.  Negative for fever, chills, diaphoresis, appetite change and fatigue.  HENT: Negative.   Eyes:  Negative.   Respiratory: Negative.  Negative for cough, choking, chest tightness, shortness of breath and stridor.   Cardiovascular: Negative.  Negative for chest pain, palpitations and leg swelling.  Gastrointestinal: Negative.  Negative for nausea, vomiting, abdominal pain, diarrhea, constipation, blood in stool and abdominal distention.  Endocrine: Negative.   Genitourinary: Negative.   Musculoskeletal: Positive for back pain and arthralgias. Negative for myalgias, joint swelling, gait problem and neck pain.  Skin: Negative.  Negative for rash.  Allergic/Immunologic: Negative.   Neurological: Positive for dizziness. Negative for weakness, light-headedness, numbness and headaches.  Hematological: Negative.  Negative for adenopathy. Does not bruise/bleed easily.  Psychiatric/Behavioral: Positive for confusion, sleep disturbance, dysphoric mood and decreased concentration. Negative for suicidal ideas, hallucinations, behavioral problems, self-injury and agitation. The patient is nervous/anxious. The patient is not hyperactive.     Objective:  BP 146/80 mmHg  Pulse 82  Temp(Src) 97.8 F (36.6 C) (Oral)  Resp 20  Ht 5' 5.5" (1.664 m)  Wt 123 lb (55.792 kg)  BMI 20.15 kg/m2  SpO2 98%  BP Readings from Last 3 Encounters:  06/03/15 146/80  05/21/15 132/82  04/30/15 138/80    Wt Readings from Last 3 Encounters:  06/03/15 123 lb (55.792 kg)  05/21/15 126 lb (57.153 kg)  04/30/15 130 lb (58.968 kg)    Physical Exam  Constitutional: She is oriented to person, place, and  time. She appears well-developed and well-nourished. No distress.  HENT:  Head: Normocephalic and atraumatic. Head is without raccoon's eyes, without Battle's sign, without contusion, without right periorbital erythema and without left periorbital erythema.    Right Ear: No hemotympanum.  Left Ear: No hemotympanum.  Mouth/Throat: No oropharyngeal exudate.  Eyes: Conjunctivae are normal. Right eye exhibits no  discharge. Left eye exhibits no discharge. No scleral icterus.  Neck: Normal range of motion. Neck supple. No JVD present. No tracheal deviation present. No thyromegaly present.  Cardiovascular: Normal rate, regular rhythm, normal heart sounds and intact distal pulses.  Exam reveals no gallop and no friction rub.   No murmur heard. Pulmonary/Chest: Effort normal and breath sounds normal. No stridor. No respiratory distress. She has no wheezes. She has no rales. She exhibits no tenderness.  Abdominal: Soft. Bowel sounds are normal. She exhibits no distension and no mass. There is no tenderness. There is no rebound and no guarding.  Musculoskeletal: Normal range of motion. She exhibits no edema or tenderness.       Cervical back: Normal.  Lymphadenopathy:    She has no cervical adenopathy.  Neurological: She is alert and oriented to person, place, and time. She has normal reflexes. She displays normal reflexes. No cranial nerve deficit. She exhibits normal muscle tone. Coordination normal.  Skin: Skin is warm and dry. No rash noted. She is not diaphoretic. No erythema. No pallor.  Psychiatric: Her behavior is normal. Judgment and thought content normal. Her mood appears anxious. Her affect is not angry, not blunt, not labile and not inappropriate. Her speech is tangential. Her speech is not delayed and not slurred. She is not agitated, not aggressive, not slowed, not withdrawn and not combative. Cognition and memory are normal. She does not exhibit a depressed mood. She expresses no homicidal and no suicidal ideation. She expresses no suicidal plans. She is attentive.  Vitals reviewed.   Lab Results  Component Value Date   WBC 5.5 04/30/2015   HGB 12.6 04/30/2015   HCT 37.0 04/30/2015   PLT 227.0 04/30/2015   GLUCOSE 83 04/30/2015   CHOL 145 04/30/2015   TRIG 276.0* 04/30/2015   HDL 23.40* 04/30/2015   LDLDIRECT 75.0 04/30/2015   LDLCALC 122* 02/24/2014   ALT 13 04/07/2015   AST 14  04/07/2015   NA 140 04/30/2015   K 4.5 04/30/2015   CL 106 04/30/2015   CREATININE 0.92 04/30/2015   BUN 11 04/30/2015   CO2 29 04/30/2015   TSH 2.57 12/08/2014   INR 0.95 04/28/2013    Dg Chest 2 View  05/08/2014   CLINICAL DATA:  Left-sided pain post fall  EXAM: CHEST  2 VIEW  COMPARISON:  04/28/2013.  FINDINGS: Cardiomediastinal silhouette is stable. Bronchiectasis right infrahilar region again noted. No acute infiltrate or pulmonary edema. There is cortical step-off left sixth rib suspicious for fracture. Clinical correlation is necessary. No pneumothorax. Mild degenerative changes thoracic spine.  IMPRESSION: Bronchiectasis right infrahilar region again noted. No acute infiltrate or pulmonary edema. There is cortical step-off left sixth rib suspicious for fracture. Clinical correlation is necessary. No pneumothorax. Mild degenerative changes thoracic spine.   Electronically Signed   By: Natasha Mead M.D.   On: 05/08/2014 09:14    Assessment & Plan:   Tarrah was seen today for hypertension and wound check.  Diagnoses and all orders for this visit:  Depression with anxiety  Frequent falls - staples removed and wound looks good, she has no apparent complications from the  recent head injury, she is overmedicating herself, I will taper her off of BZD's, she would not give urine for a UDS today   I have discontinued Sherri Weber's LORazepam, oxyCODONE, promethazine, and ALPRAZolam. I am also having her maintain her polyethylene glycol, oxybutynin, iron polysaccharides, zolpidem, saccharomyces boulardii, oxyCODONE-acetaminophen, DULoxetine, and losartan-hydrochlorothiazide.  Meds ordered this encounter  Medications  . DISCONTD: promethazine (PHENERGAN) 12.5 MG tablet    Sig: Take 12.5 mg by mouth.  . losartan-hydrochlorothiazide (HYZAAR) 100-12.5 MG per tablet    Sig: Take 1 tablet by mouth.  . DISCONTD: ALPRAZolam (XANAX) 0.5 MG tablet    Sig: Take 0.5 mg by mouth.     Follow-up:  Return in about 4 months (around 10/03/2015).  Sanda Linger, MD

## 2015-06-03 NOTE — Progress Notes (Signed)
Pre visit review using our clinic review tool, if applicable. No additional management support is needed unless otherwise documented below in the visit note. 

## 2015-06-08 ENCOUNTER — Telehealth: Payer: Self-pay | Admitting: Internal Medicine

## 2015-06-08 NOTE — Telephone Encounter (Signed)
Patient doesn't want any more physical therapy, so he is discharging her today.

## 2015-06-17 ENCOUNTER — Other Ambulatory Visit: Payer: Self-pay | Admitting: Internal Medicine

## 2015-06-18 ENCOUNTER — Telehealth: Payer: Self-pay | Admitting: Internal Medicine

## 2015-06-18 NOTE — Telephone Encounter (Signed)
Patient notified

## 2015-06-18 NOTE — Telephone Encounter (Signed)
No - it is too early for a refill She will have to wait until after July 5

## 2015-06-18 NOTE — Telephone Encounter (Signed)
Patient is requesting a refill of   oxyCODONE-acetaminophen (PERCOCET) 10-325 MG per tablet [[id number]]      She requests a call to advise when ready

## 2015-06-18 NOTE — Telephone Encounter (Signed)
Patient called asking if there is anything that can "build her up". She feels so week and tired. Please advise patient

## 2015-06-19 ENCOUNTER — Telehealth: Payer: Self-pay | Admitting: Internal Medicine

## 2015-06-19 NOTE — Telephone Encounter (Signed)
Requesting call back to continue care for a few more weeks.

## 2015-06-22 ENCOUNTER — Telehealth: Payer: Self-pay | Admitting: Internal Medicine

## 2015-06-22 NOTE — Telephone Encounter (Signed)
Patient is requesting call back from [place].  Patient would not tell me what she is calling in regards to.

## 2015-06-22 NOTE — Telephone Encounter (Signed)
Patient is requesting [employer] referral to be sent to [facility].  Patient has appoinment scheduled for 8/3.

## 2015-06-23 NOTE — Telephone Encounter (Signed)
Wendy notified.

## 2015-06-23 NOTE — Telephone Encounter (Signed)
yes

## 2015-06-23 NOTE — Telephone Encounter (Signed)
Toniann FailWendy states she is to visit patient today but needs verbal order to go out.

## 2015-06-25 ENCOUNTER — Telehealth: Payer: Self-pay | Admitting: Internal Medicine

## 2015-06-25 NOTE — Telephone Encounter (Signed)
I am not comfortable with this request 

## 2015-06-25 NOTE — Telephone Encounter (Signed)
Patient called in regarding her pain medication. She was wanting Dr. Yetta BarreJones to change the prescription to 3 x a day. She has been taking that much and is running out of them. Please advise patient

## 2015-06-25 NOTE — Telephone Encounter (Signed)
[id number] valid 07/29/15-01/25/16 for 6 visits Dr. Retta Dionesahlstedt N31.9

## 2015-06-26 ENCOUNTER — Ambulatory Visit: Payer: Commercial Managed Care - HMO | Admitting: Internal Medicine

## 2015-06-26 DIAGNOSIS — R4689 Other symptoms and signs involving appearance and behavior: Secondary | ICD-10-CM | POA: Insufficient documentation

## 2015-06-26 DIAGNOSIS — Z0289 Encounter for other administrative examinations: Secondary | ICD-10-CM

## 2015-06-26 NOTE — Telephone Encounter (Signed)
Patient notified//LMOVM advising request denied.

## 2015-06-30 ENCOUNTER — Telehealth: Payer: Self-pay | Admitting: Internal Medicine

## 2015-06-30 DIAGNOSIS — M150 Primary generalized (osteo)arthritis: Principal | ICD-10-CM

## 2015-06-30 DIAGNOSIS — M159 Polyosteoarthritis, unspecified: Secondary | ICD-10-CM

## 2015-06-30 DIAGNOSIS — M8949 Other hypertrophic osteoarthropathy, multiple sites: Secondary | ICD-10-CM

## 2015-06-30 DIAGNOSIS — M545 Low back pain: Secondary | ICD-10-CM

## 2015-06-30 MED ORDER — OXYCODONE-ACETAMINOPHEN 10-325 MG PO TABS: 1.0000 | ORAL_TABLET | Freq: Three times a day (TID) | ORAL | Status: DC | PRN

## 2015-06-30 NOTE — Telephone Encounter (Signed)
Called pt and pt is requesting refill for oxy. Please advise.

## 2015-06-30 NOTE — Telephone Encounter (Signed)
Patient called regarding request for her pain medication. CB# [phone]

## 2015-06-30 NOTE — Telephone Encounter (Signed)
Sherri Weber called pt.

## 2015-06-30 NOTE — Telephone Encounter (Signed)
done

## 2015-07-02 ENCOUNTER — Encounter: Payer: Self-pay | Admitting: Internal Medicine

## 2015-07-02 ENCOUNTER — Ambulatory Visit (INDEPENDENT_AMBULATORY_CARE_PROVIDER_SITE_OTHER): Payer: Commercial Managed Care - HMO | Admitting: Internal Medicine

## 2015-07-02 VITALS — BP 140/90 | HR 88 | Temp 97.8°F | Resp 16 | Ht 65.5 in | Wt 131.0 lb

## 2015-07-02 DIAGNOSIS — N3281 Overactive bladder: Secondary | ICD-10-CM

## 2015-07-02 DIAGNOSIS — I10 Essential (primary) hypertension: Secondary | ICD-10-CM | POA: Diagnosis not present

## 2015-07-02 DIAGNOSIS — M545 Low back pain: Secondary | ICD-10-CM

## 2015-07-02 MED ORDER — OXYBUTYNIN CHLORIDE 5 MG PO TABS
5.0000 mg | ORAL_TABLET | Freq: Two times a day (BID) | ORAL | Status: DC
Start: 2015-07-02 — End: 2016-01-19

## 2015-07-02 NOTE — Progress Notes (Signed)
Pre visit review using our clinic review tool, if applicable. No additional management support is needed unless otherwise documented below in the visit note. 

## 2015-07-02 NOTE — Progress Notes (Signed)
Subjective:  Patient ID: [name], female    DOB: [birth year]  Age: 81 y.o. MRN: [id number]  CC: Hypertension and Back Pain   HPI KORIN SETZLER presents for the complaint of dizziness - she tells me that she saw a pain doctor in [place] about 1 week ago and she was placed on gabapentin for low back pain and she noticed that dizziness developed after the first dose and has continued to bother her since she kept taking the gabapentin up until yesterday, she has not taken a dose today and feels some better. She has not noticed any vertigo and does not have any symptoms when she lays down or moves her head.  Outpatient Prescriptions Prior to Visit  Medication Sig Dispense Refill  . DULoxetine (CYMBALTA) 60 MG capsule Take 1 capsule (60 mg total) by mouth daily. 30 capsule 11  . iron polysaccharides (NIFEREX) 150 MG capsule Take 150 mg by mouth daily.    Marland Kitchen losartan-hydrochlorothiazide (HYZAAR) 100-12.5 MG per tablet Take 1 tablet by mouth.    . oxyCODONE-acetaminophen (PERCOCET) 10-325 MG per tablet Take 1 tablet by mouth every 8 (eight) hours as needed. for pain 75 tablet 0  . polyethylene glycol (MIRALAX / GLYCOLAX) packet Take 17 g by mouth daily as needed (for constipation).    . saccharomyces boulardii (FLORASTOR) 250 MG capsule Take 1 capsule (250 mg total) by mouth 2 (two) times daily. 90 capsule 3  . zolpidem (AMBIEN) 10 MG tablet TAKE ONE TABLET AT BEDTIME AS NEEDED FOR SLEEP 30 tablet 3  . oxybutynin (DITROPAN) 5 MG tablet Take 5 mg by mouth 2 (two) times daily.     No facility-administered medications prior to visit.    ROS Review of Systems  Constitutional: Negative.  Negative for fever, chills, diaphoresis, appetite change and fatigue.  HENT: Negative.  Negative for congestion, rhinorrhea, sinus pressure and trouble swallowing.   Eyes: Negative.  Negative for photophobia, redness and visual disturbance.  Respiratory: Negative.  Negative for cough, choking, chest  tightness, shortness of breath and stridor.   Cardiovascular: Negative.  Negative for chest pain, palpitations and leg swelling.  Gastrointestinal: Negative.  Negative for nausea, vomiting, abdominal pain, diarrhea, constipation and blood in stool.  Endocrine: Negative.   Genitourinary: Positive for urgency and frequency. Negative for dysuria, hematuria, flank pain, decreased urine volume, enuresis, difficulty urinating and dyspareunia.  Musculoskeletal: Positive for back pain and arthralgias. Negative for myalgias, joint swelling, gait problem, neck pain and neck stiffness.  Skin: Positive for rash.  Neurological: Positive for dizziness. Negative for tremors, seizures, syncope, facial asymmetry, speech difficulty, weakness, light-headedness, numbness and headaches.  Hematological: Negative.  Negative for adenopathy. Does not bruise/bleed easily.  Psychiatric/Behavioral: Positive for sleep disturbance. Negative for suicidal ideas, self-injury, dysphoric mood and decreased concentration. The patient is not nervous/anxious.     Objective:  BP 140/90 mmHg  Pulse 88  Temp(Src) 97.8 F (36.6 C) (Oral)  Resp 16  Ht 5' 5.5" (1.664 m)  Wt 131 lb (59.421 kg)  BMI 21.46 kg/m2  SpO2 96%  BP Readings from Last 3 Encounters:  07/02/15 140/90  06/03/15 146/80  05/21/15 132/82    Wt Readings from Last 3 Encounters:  07/02/15 131 lb (59.421 kg)  06/03/15 123 lb (55.792 kg)  05/21/15 126 lb (57.153 kg)    Physical Exam  Constitutional: She is oriented to person, place, and time. She appears well-developed and well-nourished.  HENT:  Head: Normocephalic and atraumatic.  Mouth/Throat: Oropharynx is clear  and moist. No oropharyngeal exudate.  Eyes: Conjunctivae and EOM are normal. Pupils are equal, round, and reactive to light. Right eye exhibits no discharge. Left eye exhibits no discharge. No scleral icterus.  Neck: Normal range of motion. Neck supple. No JVD present. No tracheal deviation  present. No thyromegaly present.  Cardiovascular: Normal rate, regular rhythm, normal heart sounds and intact distal pulses.  Exam reveals no gallop and no friction rub.   No murmur heard. Pulmonary/Chest: Effort normal and breath sounds normal. No stridor. No respiratory distress. She has no wheezes. She has no rales. She exhibits no tenderness.  Abdominal: Soft. Bowel sounds are normal. She exhibits no distension and no mass. There is no tenderness. There is no rebound and no guarding.  Musculoskeletal: Normal range of motion. She exhibits no edema or tenderness.  Lymphadenopathy:    She has no cervical adenopathy.  Neurological: She is alert and oriented to person, place, and time. She has normal strength. She displays no atrophy, no tremor and normal reflexes. No cranial nerve deficit or sensory deficit. She exhibits normal muscle tone. She displays a negative Romberg sign. She displays no seizure activity. Coordination and gait normal. She displays no Babinski's sign on the right side. She displays no Babinski's sign on the left side.  Reflex Scores:      Tricep reflexes are 0 on the right side and 0 on the left side.      Bicep reflexes are 0 on the right side and 0 on the left side.      Brachioradialis reflexes are 0 on the right side and 0 on the left side.      Patellar reflexes are 0 on the right side and 0 on the left side.      Achilles reflexes are 0 on the right side and 0 on the left side. Skin: Skin is warm and dry. No rash noted. She is not diaphoretic. No erythema. No pallor.  Psychiatric: She has a normal mood and affect. Her behavior is normal. Judgment and thought content normal.  Vitals reviewed.   Lab Results  Component Value Date   WBC 5.5 04/30/2015   HGB 12.6 04/30/2015   HCT 37.0 04/30/2015   PLT 227.0 04/30/2015   GLUCOSE 83 04/30/2015   CHOL 145 04/30/2015   TRIG 276.0* 04/30/2015   HDL 23.40* 04/30/2015   LDLDIRECT 75.0 04/30/2015   LDLCALC 122*  02/24/2014   ALT 13 04/07/2015   AST 14 04/07/2015   NA 140 04/30/2015   K 4.5 04/30/2015   CL 106 04/30/2015   CREATININE 0.92 04/30/2015   BUN 11 04/30/2015   CO2 29 04/30/2015   TSH 2.57 12/08/2014   INR 0.95 04/28/2013    Dg Chest 2 View  05/08/2014   CLINICAL DATA:  Left-sided pain post fall  EXAM: CHEST  2 VIEW  COMPARISON:  04/28/2013.  FINDINGS: Cardiomediastinal silhouette is stable. Bronchiectasis right infrahilar region again noted. No acute infiltrate or pulmonary edema. There is cortical step-off left sixth rib suspicious for fracture. Clinical correlation is necessary. No pneumothorax. Mild degenerative changes thoracic spine.  IMPRESSION: Bronchiectasis right infrahilar region again noted. No acute infiltrate or pulmonary edema. There is cortical step-off left sixth rib suspicious for fracture. Clinical correlation is necessary. No pneumothorax. Mild degenerative changes thoracic spine.   Electronically Signed   By: Natasha MeadLiviu  Pop M.D.   On: 05/08/2014 09:14    Assessment & Plan:   Talbert ForestShirley was seen today for hypertension and back pain.  Diagnoses and all orders for this visit:  Essential hypertension- her BP is well controlled  OAB (overactive bladder)- will restart ditropan Orders: -     oxybutynin (DITROPAN) 5 MG tablet; Take 1 tablet (5 mg total) by mouth 2 (two) times daily.   I have changed Ms. Maggi's oxybutynin. I am also having her maintain her polyethylene glycol, iron polysaccharides, saccharomyces boulardii, DULoxetine, losartan-hydrochlorothiazide, zolpidem, and oxyCODONE-acetaminophen.  Meds ordered this encounter  Medications  . oxybutynin (DITROPAN) 5 MG tablet    Sig: Take 1 tablet (5 mg total) by mouth 2 (two) times daily.    Dispense:  180 tablet    Refill:  3     Follow-up: Return in about 6 months (around 01/02/2016).  Sanda Linger, MD

## 2015-07-02 NOTE — Patient Instructions (Signed)

## 2015-07-03 NOTE — Assessment & Plan Note (Signed)
She tells me that neurontin did not help with her LBP and it caused dizziness so I have asked her to stop taking it, she will notify her pain MD in [place] of this side effect Will cont percocet as needed for pain

## 2015-07-23 ENCOUNTER — Telehealth: Payer: Self-pay | Admitting: Internal Medicine

## 2015-07-23 NOTE — Telephone Encounter (Signed)
Phone call from [facility] medical supply call to check up on the PA for ostomy supply for Mrs. Borbon. Please check, pt is out of her supply. Question please call [phone].

## 2015-07-24 ENCOUNTER — Other Ambulatory Visit (INDEPENDENT_AMBULATORY_CARE_PROVIDER_SITE_OTHER): Payer: Commercial Managed Care - HMO

## 2015-07-24 ENCOUNTER — Ambulatory Visit (INDEPENDENT_AMBULATORY_CARE_PROVIDER_SITE_OTHER): Payer: Commercial Managed Care - HMO | Admitting: Internal Medicine

## 2015-07-24 ENCOUNTER — Encounter: Payer: Self-pay | Admitting: Internal Medicine

## 2015-07-24 VITALS — BP 144/88 | HR 77 | Temp 98.1°F | Resp 16 | Wt 135.0 lb

## 2015-07-24 DIAGNOSIS — I10 Essential (primary) hypertension: Secondary | ICD-10-CM

## 2015-07-24 DIAGNOSIS — A0472 Enterocolitis due to Clostridium difficile, not specified as recurrent: Secondary | ICD-10-CM

## 2015-07-24 DIAGNOSIS — R42 Dizziness and giddiness: Secondary | ICD-10-CM | POA: Diagnosis not present

## 2015-07-24 DIAGNOSIS — A047 Enterocolitis due to Clostridium difficile: Secondary | ICD-10-CM

## 2015-07-24 LAB — CBC WITH DIFFERENTIAL/PLATELET
Basophils Absolute: 0 10*3/uL (ref 0.0–0.1)
Basophils Relative: 0.8 % (ref 0.0–3.0)
Eosinophils Absolute: 0.2 10*3/uL (ref 0.0–0.7)
Eosinophils Relative: 3.1 % (ref 0.0–5.0)
HCT: 36.3 % (ref 36.0–46.0)
Hemoglobin: 12.5 g/dL (ref 12.0–15.0)
Lymphocytes Relative: 15.3 % (ref 12.0–46.0)
Lymphs Abs: 0.9 10*3/uL (ref 0.7–4.0)
MCHC: 34.4 g/dL (ref 30.0–36.0)
MCV: 89.3 fl (ref 78.0–100.0)
Monocytes Absolute: 0.5 10*3/uL (ref 0.1–1.0)
Monocytes Relative: 8.8 % (ref 3.0–12.0)
Neutro Abs: 4 10*3/uL (ref 1.4–7.7)
Neutrophils Relative %: 72 % (ref 43.0–77.0)
Platelets: 229 10*3/uL (ref 150.0–400.0)
RBC: 4.06 Mil/uL (ref 3.87–5.11)
RDW: 15 % (ref 11.5–15.5)
WBC: 5.6 10*3/uL (ref 4.0–10.5)

## 2015-07-24 LAB — HEPATIC FUNCTION PANEL
ALT: 10 U/L (ref 0–35)
AST: 14 U/L (ref 0–37)
Albumin: 3.8 g/dL (ref 3.5–5.2)
Alkaline Phosphatase: 73 U/L (ref 39–117)
Bilirubin, Direct: 0.1 mg/dL (ref 0.0–0.3)
Total Bilirubin: 0.3 mg/dL (ref 0.2–1.2)
Total Protein: 7 g/dL (ref 6.0–8.3)

## 2015-07-24 LAB — BASIC METABOLIC PANEL
BUN: 12 mg/dL (ref 6–23)
CO2: 30 mEq/L (ref 19–32)
Calcium: 9.1 mg/dL (ref 8.4–10.5)
Chloride: 107 mEq/L (ref 96–112)
Creatinine, Ser: 0.89 mg/dL (ref 0.40–1.20)
GFR: 64.83 mL/min (ref >60.00)
Glucose, Bld: 89 mg/dL (ref 70–99)
Potassium: 4.9 mEq/L (ref 3.5–5.1)
Sodium: 142 mEq/L (ref 135–145)

## 2015-07-24 MED ORDER — METOPROLOL TARTRATE 25 MG PO TABS: 25.0000 mg | ORAL_TABLET | Freq: Two times a day (BID) | ORAL | Status: DC

## 2015-07-24 NOTE — Progress Notes (Signed)
   Subjective:    Patient ID: [name], female    DOB: 1933-09-17, 81 y.o.   MRN: [id number]  HPI  Her complex history was reviewed. She has a hip fracture in a low impact fall in February. The surgery was done in [place] and she was subsequenty placed @ [facility] for rehabilitation. There she developed C. difficile colitis. She's now being followed at [facility] by Dr Lorri Frederick, GI. On 06/11/15 she received fecal transplants. On 7/21 C. difficile was positive. She has a follow-up 08/03/15.  She states she stays "dizzy and drunk" and off balance.  After she eats she will have 5-6 very loose stools and has to empty her ostomy bag 5-6 times an hour thereafter. She's not been taking the probiotic.  She is on Percocet on average once a day. She also takes temazepam at bedtime on a regular basis. She does not drink.  Review of Systems Unexplained weight loss, abdominal pain, significant dyspepsia, dysphagia, melena, rectal bleeding, or persistently small caliber stools denied.  Fever, chills, sweats, or unexplained weight loss not present. Blurred vision , diplopia or vision loss absent. Vertigo or near syncope denied. There is no numbness, tingling, or weakness in extremities.   No loss of control of bladder. No seizure stigmata.     Objective:   Physical Exam Pertinent or positive findings include: She appears somewhat frail but adequately nourished. She has bilateral ptosis. She has an upper dental plate. Bowel sounds are active. She has an ostomy bag in the left lower quadrant. There is slight tenting of the skin. When she walks she is profoundly pigeon toed. Postural BP: 144/88 sitting and 162/90 standing.No change in pulse.  General appearance : in no distress.  Eyes: No conjunctival inflammation or scleral icterus is present.  Oral exam:  Lips and gums are healthy appearing.There is no oropharyngeal erythema or exudate noted.  Heart:  Normal rate and regular rhythm. S1  and S2 normal without gallop, murmur, click, rub or other extra sounds    Lungs:Chest clear to auscultation; no wheezes, rhonchi,rales ,or rubs present.No increased work of breathing.   Abdomen: bowel sounds normal, soft and non-tender without masses, organomegaly or hernias noted.  No guarding or rebound.   Vascular : all pulses equal ; no bruits present.  Skin:Warm & dry.  Intact without suspicious lesions or rashes ; no  jaundice   Lymphatic: No lymphadenopathy is noted about the head, neck, axilla.   Neuro: Strength, tone decreased         Assessment & Plan:  #1 bowel changes with positive C. difficile; status post fecal transplant  #2 lightheadedness  #3 HTN  Plan: See orders and recommendations

## 2015-07-24 NOTE — Telephone Encounter (Signed)
No PA request has been received by me.

## 2015-07-24 NOTE — Progress Notes (Signed)
Patient received education resource, including the self-management goal and tool. Patient verbalized understanding. 

## 2015-07-24 NOTE — Patient Instructions (Addendum)
Please take a probiotic , Florastor every day if the bowels are loose. This will replace the normal bacteria which  are necessary for formation of normal stool and processing of food.  Minimal Blood Pressure Goal= AVERAGE < 140/90;  Ideal is an AVERAGE < 135/85. This AVERAGE should be calculated from @ least 5-7 BP readings taken @ different times of day on different days of week. You should not respond to isolated BP readings , but rather the AVERAGE for that week .Please bring your  blood pressure cuff to office visits to verify that it is reliable.It  can also be checked against the blood pressure device at the pharmacy. Finger or wrist cuffs are not dependable; an arm cuff is.  Please take the pain medication and the sleeping pill only as needed; these may increase the risk of exacerbating the lightheadedness with associated risk of falling and serious injury.

## 2015-07-28 ENCOUNTER — Telehealth: Payer: Self-pay | Admitting: Internal Medicine

## 2015-07-28 DIAGNOSIS — M545 Low back pain: Secondary | ICD-10-CM

## 2015-07-28 DIAGNOSIS — M150 Primary generalized (osteo)arthritis: Principal | ICD-10-CM

## 2015-07-28 DIAGNOSIS — M8949 Other hypertrophic osteoarthropathy, multiple sites: Secondary | ICD-10-CM

## 2015-07-28 DIAGNOSIS — M159 Polyosteoarthritis, unspecified: Secondary | ICD-10-CM

## 2015-07-28 NOTE — Telephone Encounter (Signed)
Pt called in and said that she needs a refill on her oxyCODONE-acetaminophen (PERCOCET) 10-325 MG per tablet [[id number]]     She wants to pick it up Friday

## 2015-07-28 NOTE — Telephone Encounter (Signed)
Returned call to [employer] [facility] x 2, on hold x several minutes and hung up. Unable to select an ext due to no documented contact name or ext listed in original incoming message.

## 2015-07-29 MED ORDER — OXYCODONE-ACETAMINOPHEN 10-325 MG PO TABS: 1.0000 | ORAL_TABLET | Freq: Three times a day (TID) | ORAL | Status: DC | PRN

## 2015-07-29 NOTE — Telephone Encounter (Signed)
Prescription written, she doesn't need an appointment.

## 2015-07-29 NOTE — Telephone Encounter (Signed)
Patient called in thinking she needs an appointment to get her prescription. She is scheduled for tomorrow morning. Please let me know if she doesn't need to.

## 2015-07-29 NOTE — Telephone Encounter (Signed)
Left msg on vm to cancel appt.

## 2015-07-30 ENCOUNTER — Ambulatory Visit: Payer: Commercial Managed Care - HMO | Admitting: Internal Medicine

## 2015-08-11 ENCOUNTER — Ambulatory Visit (INDEPENDENT_AMBULATORY_CARE_PROVIDER_SITE_OTHER): Payer: Commercial Managed Care - HMO | Admitting: Internal Medicine

## 2015-08-11 ENCOUNTER — Encounter: Payer: Self-pay | Admitting: Internal Medicine

## 2015-08-11 VITALS — BP 122/82 | HR 53 | Temp 97.8°F | Resp 16 | Ht 66.0 in | Wt 130.0 lb

## 2015-08-11 DIAGNOSIS — A0472 Enterocolitis due to Clostridium difficile, not specified as recurrent: Secondary | ICD-10-CM

## 2015-08-11 DIAGNOSIS — A047 Enterocolitis due to Clostridium difficile: Secondary | ICD-10-CM

## 2015-08-11 DIAGNOSIS — I10 Essential (primary) hypertension: Secondary | ICD-10-CM | POA: Diagnosis not present

## 2015-08-11 MED ORDER — DIPHENOXYLATE-ATROPINE 2.5-0.025 MG PO TABS: 1.0000 | ORAL_TABLET | Freq: Four times a day (QID) | ORAL | Status: DC | PRN

## 2015-08-11 NOTE — Patient Instructions (Signed)

## 2015-08-11 NOTE — Progress Notes (Signed)
Pre visit review using our clinic review tool, if applicable. No additional management support is needed unless otherwise documented below in the visit note. 

## 2015-08-12 NOTE — Progress Notes (Signed)
Subjective:  Patient ID: Sherri Weber, female    DOB: [birth year]  Age: 81 y.o. MRN: [id number]  CC: Diarrhea   HPI MALKIE WILLE presents for diarrhea. She is being treated by gastroenterologist for Clostridium difficile colitis. She is on vancomycin and has had stool transplants. She continues to be symptomatic. She has tried Imodium without much relief from the symptoms.  Outpatient Prescriptions Prior to Visit  Medication Sig Dispense Refill  . DULoxetine (CYMBALTA) 60 MG capsule Take 1 capsule (60 mg total) by mouth daily. 30 capsule 11  . iron polysaccharides (NIFEREX) 150 MG capsule Take 150 mg by mouth daily.    Marland Kitchen losartan-hydrochlorothiazide (HYZAAR) 100-12.5 MG per tablet Take 1 tablet by mouth.    . metoprolol tartrate (LOPRESSOR) 25 MG tablet Take 1 tablet (25 mg total) by mouth 2 (two) times daily. 60 tablet 1  . oxybutynin (DITROPAN) 5 MG tablet Take 1 tablet (5 mg total) by mouth 2 (two) times daily. 180 tablet 3  . oxyCODONE-acetaminophen (PERCOCET) 10-325 MG per tablet Take 1 tablet by mouth every 8 (eight) hours as needed. for pain 75 tablet 0  . saccharomyces boulardii (FLORASTOR) 250 MG capsule Take 1 capsule (250 mg total) by mouth 2 (two) times daily. 90 capsule 3  . zolpidem (AMBIEN) 10 MG tablet TAKE ONE TABLET AT BEDTIME AS NEEDED FOR SLEEP 30 tablet 3  . polyethylene glycol (MIRALAX / GLYCOLAX) packet Take 17 g by mouth daily as needed (for constipation).     No facility-administered medications prior to visit.    ROS Review of Systems  Constitutional: Positive for fatigue. Negative for fever, chills, diaphoresis and appetite change.  HENT: Negative.   Eyes: Negative.   Respiratory: Negative.  Negative for cough, choking, chest tightness, shortness of breath and stridor.   Cardiovascular: Negative.  Negative for chest pain, palpitations and leg swelling.  Gastrointestinal: Positive for diarrhea. Negative for nausea, vomiting, abdominal pain,  constipation, blood in stool and anal bleeding.  Endocrine: Negative.   Genitourinary: Negative.   Musculoskeletal: Positive for back pain and arthralgias. Negative for myalgias and joint swelling.  Skin: Negative.  Negative for pallor and rash.  Allergic/Immunologic: Negative.   Neurological: Negative.  Negative for dizziness, tremors, weakness and light-headedness.  Hematological: Negative.  Negative for adenopathy. Does not bruise/bleed easily.  Psychiatric/Behavioral: Negative.     Objective:  BP 122/82 mmHg  Pulse 53  Temp(Src) 97.8 F (36.6 C) (Oral)  Ht 5\' 6"  (1.676 m)  Wt 130 lb (58.968 kg)  BMI 20.99 kg/m2  SpO2 95%  BP Readings from Last 3 Encounters:  08/11/15 122/82  07/25/15 144/88  07/02/15 140/90    Wt Readings from Last 3 Encounters:  08/11/15 130 lb (58.968 kg)  07/24/15 135 lb (61.236 kg)  07/02/15 131 lb (59.421 kg)    Physical Exam  Constitutional: She is oriented to person, place, and time. No distress.  HENT:  Mouth/Throat: Oropharynx is clear and moist. No oropharyngeal exudate.  Eyes: Conjunctivae are normal. Right eye exhibits no discharge. Left eye exhibits no discharge. No scleral icterus.  Neck: Normal range of motion. Neck supple. No JVD present. No tracheal deviation present. No thyromegaly present.  Cardiovascular: Normal rate, regular rhythm, normal heart sounds and intact distal pulses.  Exam reveals no gallop and no friction rub.   No murmur heard. Pulmonary/Chest: Effort normal and breath sounds normal. No stridor. No respiratory distress. She has no wheezes. She has no rales. She exhibits no tenderness.  Abdominal:  Soft. Bowel sounds are normal. She exhibits no distension and no mass. There is no tenderness. There is no rebound and no guarding.  Musculoskeletal: Normal range of motion. She exhibits edema (trace edema in bilateral lower extremity). She exhibits no tenderness.  Lymphadenopathy:    She has no cervical adenopathy.    Neurological: She is oriented to person, place, and time.  Skin: Skin is warm and dry. No rash noted. She is not diaphoretic. No erythema. No pallor.  Psychiatric: She has a normal mood and affect. Her behavior is normal. Judgment and thought content normal.    Lab Results  Component Value Date   WBC 5.6 07/24/2015   HGB 12.5 07/24/2015   HCT 36.3 07/24/2015   PLT 229.0 07/24/2015   GLUCOSE 89 07/24/2015   CHOL 145 04/30/2015   TRIG 276.0* 04/30/2015   HDL 23.40* 04/30/2015   LDLDIRECT 75.0 04/30/2015   LDLCALC 122* 02/24/2014   ALT 10 07/24/2015   AST 14 07/24/2015   NA 142 07/24/2015   K 4.9 07/24/2015   CL 107 07/24/2015   CREATININE 0.89 07/24/2015   BUN 12 07/24/2015   CO2 30 07/24/2015   TSH 2.57 12/08/2014   INR 0.95 04/28/2013    Dg Chest 2 View  05/08/2014   CLINICAL DATA:  Left-sided pain post fall  EXAM: CHEST  2 VIEW  COMPARISON:  04/28/2013.  FINDINGS: Cardiomediastinal silhouette is stable. Bronchiectasis right infrahilar region again noted. No acute infiltrate or pulmonary edema. There is cortical step-off left sixth rib suspicious for fracture. Clinical correlation is necessary. No pneumothorax. Mild degenerative changes thoracic spine.  IMPRESSION: Bronchiectasis right infrahilar region again noted. No acute infiltrate or pulmonary edema. There is cortical step-off left sixth rib suspicious for fracture. Clinical correlation is necessary. No pneumothorax. Mild degenerative changes thoracic spine.   Electronically Signed   By: Natasha Mead M.D.   On: 05/08/2014 09:14    Assessment & Plan:   Paulene was seen today for diarrhea.  Diagnoses and all orders for this visit:  Essential hypertension- her blood pressure is well-controlled.  Colitis due to Clostridium difficile- will try Lomotil to see if that helps with her symptoms. -     diphenoxylate-atropine (LOMOTIL) 2.5-0.025 MG per tablet; Take 1 tablet by mouth 4 (four) times daily as needed for diarrhea or  loose stools.   I have discontinued Ms. Towers's polyethylene glycol and LORazepam. I am also having her start on diphenoxylate-atropine. Additionally, I am having her maintain her iron polysaccharides, saccharomyces boulardii, DULoxetine, losartan-hydrochlorothiazide, zolpidem, oxybutynin, metoprolol tartrate, oxyCODONE-acetaminophen, vancomycin, gabapentin, ALPRAZolam, and promethazine.  Meds ordered this encounter  Medications  . DISCONTD: vancomycin (VANCOCIN HCL) 125 MG capsule    Sig: Take 125 mg by mouth 4 (four) times daily.  . vancomycin (VANCOCIN) 1000 MG injection    Sig: Take 125 mg by mouth.  . gabapentin (NEURONTIN) 100 MG capsule    Sig: See titration instructions . Goal is 200mg  TID  . DISCONTD: LORazepam (ATIVAN) 0.5 MG tablet    Sig: Take 0.5 mg by mouth.  . ALPRAZolam (XANAX) 0.5 MG tablet    Sig: Take 0.5 mg by mouth.  . promethazine (PHENERGAN) 12.5 MG tablet    Sig: Take 12.5 mg by mouth.  . diphenoxylate-atropine (LOMOTIL) 2.5-0.025 MG per tablet    Sig: Take 1 tablet by mouth 4 (four) times daily as needed for diarrhea or loose stools.    Dispense:  75 tablet    Refill:  2  Follow-up: Return in about 2 months (around 10/11/2015).  Sanda Linger, MD

## 2015-08-24 ENCOUNTER — Telehealth: Payer: Self-pay | Admitting: Internal Medicine

## 2015-08-24 NOTE — Telephone Encounter (Signed)
Calling the pt back to see if she can come in for an OV for the dizziness. Will forward message to Dr. Yetta Barre about script for depression.

## 2015-08-24 NOTE — Telephone Encounter (Signed)
Patient states she has spoken to Dr. Yetta Barre about prescribing her something for depression.  Patient is asking for something to "get her out of the house".  Patient states she gets up and gets dressed but then does not go anywhere.  Patient also is stating she is dizzy headed. Please advise.

## 2015-08-25 NOTE — Telephone Encounter (Signed)
Pt scheduled to come in on Thursday for dizziness

## 2015-08-25 NOTE — Telephone Encounter (Signed)
Patient is returning your call, call as soon as possible

## 2015-08-27 ENCOUNTER — Ambulatory Visit: Payer: Commercial Managed Care - HMO | Admitting: Internal Medicine

## 2015-08-27 ENCOUNTER — Telehealth: Payer: Self-pay | Admitting: Internal Medicine

## 2015-08-27 DIAGNOSIS — M159 Polyosteoarthritis, unspecified: Secondary | ICD-10-CM

## 2015-08-27 DIAGNOSIS — M150 Primary generalized (osteo)arthritis: Principal | ICD-10-CM

## 2015-08-27 DIAGNOSIS — M8949 Other hypertrophic osteoarthropathy, multiple sites: Secondary | ICD-10-CM

## 2015-08-27 DIAGNOSIS — M545 Low back pain: Secondary | ICD-10-CM

## 2015-08-27 MED ORDER — OXYCODONE-ACETAMINOPHEN 10-325 MG PO TABS: 1.0000 | ORAL_TABLET | Freq: Three times a day (TID) | ORAL | Status: DC | PRN

## 2015-08-27 NOTE — Telephone Encounter (Signed)
Pt called in said that she is having trouble with her bag today and has a call in to GI Dr.  It is bleeding and she is concerned.  She was not able to make it today her appt today because of the issue that is going on with her bag.  She would like to know if she can get a refill on her Hydrocodone and have someone pick it up tomorrow?  We rec her appt to next tues.

## 2015-08-27 NOTE — Telephone Encounter (Signed)
Rx written.

## 2015-08-27 NOTE — Telephone Encounter (Signed)
Pt aware to pick up

## 2015-08-27 NOTE — Telephone Encounter (Signed)
At front

## 2015-08-27 NOTE — Telephone Encounter (Signed)
Pt call back to check up this request, pt was wondering if she can pick this up tomorrow. Please call pt

## 2015-09-01 ENCOUNTER — Encounter: Payer: Self-pay | Admitting: Internal Medicine

## 2015-09-01 ENCOUNTER — Ambulatory Visit (INDEPENDENT_AMBULATORY_CARE_PROVIDER_SITE_OTHER): Payer: Commercial Managed Care - HMO | Admitting: Internal Medicine

## 2015-09-01 VITALS — BP 118/78 | HR 60 | Temp 98.0°F | Resp 16 | Ht 66.0 in | Wt 133.0 lb

## 2015-09-01 DIAGNOSIS — M8949 Other hypertrophic osteoarthropathy, multiple sites: Secondary | ICD-10-CM

## 2015-09-01 DIAGNOSIS — Z23 Encounter for immunization: Secondary | ICD-10-CM | POA: Diagnosis not present

## 2015-09-01 DIAGNOSIS — F418 Other specified anxiety disorders: Secondary | ICD-10-CM

## 2015-09-01 DIAGNOSIS — S32009K Unspecified fracture of unspecified lumbar vertebra, subsequent encounter for fracture with nonunion: Secondary | ICD-10-CM

## 2015-09-01 DIAGNOSIS — M150 Primary generalized (osteo)arthritis: Secondary | ICD-10-CM | POA: Diagnosis not present

## 2015-09-01 DIAGNOSIS — M159 Polyosteoarthritis, unspecified: Secondary | ICD-10-CM

## 2015-09-01 MED ORDER — BUPRENORPHINE 15 MCG/HR TD PTWK: 1.0000 | MEDICATED_PATCH | TRANSDERMAL | Status: DC

## 2015-09-01 MED ORDER — VORTIOXETINE HBR 5 MG PO TABS: 1.0000 | ORAL_TABLET | Freq: Every day | ORAL | Status: DC

## 2015-09-01 NOTE — Progress Notes (Signed)
Pre visit review using our clinic review tool, if applicable. No additional management support is needed unless otherwise documented below in the visit note. 

## 2015-09-01 NOTE — Patient Instructions (Signed)

## 2015-09-02 NOTE — Progress Notes (Signed)
Subjective:  Patient ID: Sherri Weber, female    DOB: [birth year]  Age: 81 y.o. MRN: [id number]  CC: Osteoarthritis; Back Pain; and Depression   HPI Sherri Weber presents for evaluation of pain, she wants to try to get off of Percocet. She also complains of depression, sadness, anxiety, insomnia and fatigue. She has previously been prescribed Cymbalta but admits that she has not been taking it for several months because never thought it worked very well.  Outpatient Prescriptions Prior to Visit  Medication Sig Dispense Refill  . ALPRAZolam (XANAX) 0.5 MG tablet Take 0.5 mg by mouth.    . diphenoxylate-atropine (LOMOTIL) 2.5-0.025 MG per tablet Take 1 tablet by mouth 4 (four) times daily as needed for diarrhea or loose stools. 75 tablet 2  . gabapentin (NEURONTIN) 100 MG capsule See titration instructions . Goal is 200mg  TID    . iron polysaccharides (NIFEREX) 150 MG capsule Take 150 mg by mouth daily.    Marland Kitchen losartan-hydrochlorothiazide (HYZAAR) 100-12.5 MG per tablet Take 1 tablet by mouth.    . metoprolol tartrate (LOPRESSOR) 25 MG tablet Take 1 tablet (25 mg total) by mouth 2 (two) times daily. 60 tablet 1  . oxybutynin (DITROPAN) 5 MG tablet Take 1 tablet (5 mg total) by mouth 2 (two) times daily. 180 tablet 3  . promethazine (PHENERGAN) 12.5 MG tablet Take 12.5 mg by mouth.    . saccharomyces boulardii (FLORASTOR) 250 MG capsule Take 1 capsule (250 mg total) by mouth 2 (two) times daily. 90 capsule 3  . vancomycin (VANCOCIN) 1000 MG injection Take 125 mg by mouth.    . zolpidem (AMBIEN) 10 MG tablet TAKE ONE TABLET AT BEDTIME AS NEEDED FOR SLEEP 30 tablet 3  . DULoxetine (CYMBALTA) 60 MG capsule Take 1 capsule (60 mg total) by mouth daily. 30 capsule 11  . oxyCODONE-acetaminophen (PERCOCET) 10-325 MG per tablet Take 1 tablet by mouth every 8 (eight) hours as needed. for pain 75 tablet 0   No facility-administered medications prior to visit.    ROS Review of Systems    Constitutional: Negative.  Negative for fever, chills, diaphoresis, appetite change and fatigue.  HENT: Negative.   Eyes: Negative.   Respiratory: Negative.  Negative for cough, choking, chest tightness, shortness of breath and stridor.   Cardiovascular: Negative.  Negative for chest pain, palpitations and leg swelling.  Gastrointestinal: Negative.  Negative for nausea, vomiting, abdominal pain, diarrhea, constipation and blood in stool.  Genitourinary: Negative.   Musculoskeletal: Positive for arthralgias. Negative for myalgias.  Skin: Negative.  Negative for rash.  Allergic/Immunologic: Negative.   Neurological: Negative.   Hematological: Negative.  Negative for adenopathy. Does not bruise/bleed easily.  Psychiatric/Behavioral: Positive for sleep disturbance and dysphoric mood. Negative for suicidal ideas, hallucinations, behavioral problems, confusion, self-injury, decreased concentration and agitation. The patient is nervous/anxious. The patient is not hyperactive.     Objective:  BP 118/78 mmHg  Pulse 60  Temp(Src) 98 F (36.7 C) (Oral)  Resp 16  Ht 5\' 6"  (1.676 m)  Wt 133 lb (60.328 kg)  BMI 21.48 kg/m2  SpO2 98%  BP Readings from Last 3 Encounters:  09/01/15 118/78  08/11/15 122/82  07/25/15 144/88    Wt Readings from Last 3 Encounters:  09/01/15 133 lb (60.328 kg)  08/11/15 130 lb (58.968 kg)  07/24/15 135 lb (61.236 kg)    Physical Exam  Constitutional: She is oriented to person, place, and time. No distress.  HENT:  Head: Normocephalic and atraumatic.  Mouth/Throat: Oropharynx is clear and moist. No oropharyngeal exudate.  Eyes: Conjunctivae are normal. Right eye exhibits no discharge. Left eye exhibits no discharge. No scleral icterus.  Neck: Normal range of motion. Neck supple. No JVD present. No tracheal deviation present. No thyromegaly present.  Cardiovascular: Normal rate, regular rhythm, normal heart sounds and intact distal pulses.  Exam reveals no  gallop and no friction rub.   No murmur heard. Pulmonary/Chest: Effort normal and breath sounds normal. No stridor. No respiratory distress. She has no wheezes. She has no rales. She exhibits no tenderness.  Abdominal: Soft. Bowel sounds are normal. She exhibits no distension and no mass. There is no tenderness. There is no rebound and no guarding.  Musculoskeletal: Normal range of motion. She exhibits no edema.  Lymphadenopathy:    She has no cervical adenopathy.  Neurological: She is oriented to person, place, and time.  Skin: Skin is warm and dry. No rash noted. She is not diaphoretic. No erythema. No pallor.  Psychiatric: She has a normal mood and affect. Her behavior is normal. Judgment and thought content normal.  Vitals reviewed.   Lab Results  Component Value Date   WBC 5.6 07/24/2015   HGB 12.5 07/24/2015   HCT 36.3 07/24/2015   PLT 229.0 07/24/2015   GLUCOSE 89 07/24/2015   CHOL 145 04/30/2015   TRIG 276.0* 04/30/2015   HDL 23.40* 04/30/2015   LDLDIRECT 75.0 04/30/2015   LDLCALC 122* 02/24/2014   ALT 10 07/24/2015   AST 14 07/24/2015   NA 142 07/24/2015   K 4.9 07/24/2015   CL 107 07/24/2015   CREATININE 0.89 07/24/2015   BUN 12 07/24/2015   CO2 30 07/24/2015   TSH 2.57 12/08/2014   INR 0.95 04/28/2013    Dg Chest 2 View  05/08/2014   CLINICAL DATA:  Left-sided pain post fall  EXAM: CHEST  2 VIEW  COMPARISON:  04/28/2013.  FINDINGS: Cardiomediastinal silhouette is stable. Bronchiectasis right infrahilar region again noted. No acute infiltrate or pulmonary edema. There is cortical step-off left sixth rib suspicious for fracture. Clinical correlation is necessary. No pneumothorax. Mild degenerative changes thoracic spine.  IMPRESSION: Bronchiectasis right infrahilar region again noted. No acute infiltrate or pulmonary edema. There is cortical step-off left sixth rib suspicious for fracture. Clinical correlation is necessary. No pneumothorax. Mild degenerative changes  thoracic spine.   Electronically Signed   By: Natasha Mead M.D.   On: 05/08/2014 09:14    Assessment & Plan:   Sherri Weber was seen today for osteoarthritis, back pain and depression.  Diagnoses and all orders for this visit:  Need for influenza vaccination -     Flu vaccine HIGH DOSE PF (Fluzone High dose)  Depression with anxiety- she will start Trintellix, I gave her the 5 mg dose samples and will try that dose for several months and consider increasing to 10 or 20 mg depending on her response to the 5 mg dose.. -     Vortioxetine HBr (TRINTELLIX) 5 MG TABS; Take 1 tablet (5 mg total) by mouth daily.  Lumbar vertebral fracture, with nonunion, subsequent encounter- I've asked her to start using the Butrans patch for pain. She will continues to use Percocet while the Butrans patch takes effect. Over the next few weeks and months I will try to taper her off Percocet. -     Buprenorphine 15 MCG/HR PTWK; Place 1 Act onto the skin once a week.  Primary osteoarthritis involving multiple joints- as above -  Buprenorphine 15 MCG/HR PTWK; Place 1 Act onto the skin once a week.   I have discontinued Ms. Wass's DULoxetine and oxyCODONE-acetaminophen. I am also having her start on Vortioxetine HBr and Buprenorphine. Additionally, I am having her maintain her iron polysaccharides, saccharomyces boulardii, losartan-hydrochlorothiazide, zolpidem, oxybutynin, metoprolol tartrate, vancomycin, gabapentin, ALPRAZolam, promethazine, diphenoxylate-atropine, and vancomycin.  Meds ordered this encounter  Medications  . meclizine (ANTIVERT) 25 MG tablet    Sig: Take 12.5 mg by mouth.  . vancomycin (VANCOCIN) 50 mg/mL oral solution    Sig:     Refill:  0  . Vortioxetine HBr (TRINTELLIX) 5 MG TABS    Sig: Take 1 tablet (5 mg total) by mouth daily.    Dispense:  30 tablet    Refill:  2  . Buprenorphine 15 MCG/HR PTWK    Sig: Place 1 Act onto the skin once a week.    Dispense:  4 patch    Refill:  5      Follow-up: Return in about 4 weeks (around 09/29/2015).  Sanda Linger, MD

## 2015-09-03 ENCOUNTER — Telehealth: Payer: Self-pay | Admitting: Internal Medicine

## 2015-09-15 ENCOUNTER — Inpatient Hospital Stay: Payer: Commercial Managed Care - HMO | Admitting: Internal Medicine

## 2015-09-16 ENCOUNTER — Other Ambulatory Visit (INDEPENDENT_AMBULATORY_CARE_PROVIDER_SITE_OTHER): Payer: Commercial Managed Care - HMO

## 2015-09-16 ENCOUNTER — Encounter: Payer: Self-pay | Admitting: Internal Medicine

## 2015-09-16 ENCOUNTER — Ambulatory Visit (INDEPENDENT_AMBULATORY_CARE_PROVIDER_SITE_OTHER): Payer: Commercial Managed Care - HMO | Admitting: Internal Medicine

## 2015-09-16 VITALS — BP 112/66 | HR 66 | Temp 98.4°F | Resp 16 | Wt 132.0 lb

## 2015-09-16 DIAGNOSIS — R790 Abnormal level of blood mineral: Secondary | ICD-10-CM

## 2015-09-16 DIAGNOSIS — D649 Anemia, unspecified: Secondary | ICD-10-CM | POA: Diagnosis not present

## 2015-09-16 DIAGNOSIS — Z8639 Personal history of other endocrine, nutritional and metabolic disease: Secondary | ICD-10-CM | POA: Diagnosis not present

## 2015-09-16 DIAGNOSIS — R197 Diarrhea, unspecified: Secondary | ICD-10-CM

## 2015-09-16 DIAGNOSIS — R5383 Other fatigue: Secondary | ICD-10-CM | POA: Diagnosis not present

## 2015-09-16 LAB — CBC WITH DIFFERENTIAL/PLATELET
Basophils Absolute: 0 10*3/uL (ref 0.0–0.1)
Basophils Relative: 0.7 % (ref 0.0–3.0)
Eosinophils Absolute: 0.2 10*3/uL (ref 0.0–0.7)
Eosinophils Relative: 3.9 % (ref 0.0–5.0)
HCT: 36.1 % (ref 36.0–46.0)
Hemoglobin: 12.4 g/dL (ref 12.0–15.0)
Lymphocytes Relative: 19.1 % (ref 12.0–46.0)
Lymphs Abs: 1.1 10*3/uL (ref 0.7–4.0)
MCHC: 34.4 g/dL (ref 30.0–36.0)
MCV: 91.1 fl (ref 78.0–100.0)
Monocytes Absolute: 0.5 10*3/uL (ref 0.1–1.0)
Monocytes Relative: 9.6 % (ref 3.0–12.0)
Neutro Abs: 3.7 10*3/uL (ref 1.4–7.7)
Neutrophils Relative %: 66.7 % (ref 43.0–77.0)
Platelets: 264 10*3/uL (ref 150.0–400.0)
RBC: 3.96 Mil/uL (ref 3.87–5.11)
RDW: 14.2 % (ref 11.5–15.5)
WBC: 5.6 10*3/uL (ref 4.0–10.5)

## 2015-09-16 LAB — MAGNESIUM: Magnesium: 2 mg/dL (ref 1.5–2.5)

## 2015-09-16 LAB — VITAMIN B12: Vitamin B-12: 286 pg/mL (ref 211–911)

## 2015-09-16 LAB — TSH: TSH: 2.71 u[IU]/mL (ref 0.35–4.50)

## 2015-09-16 NOTE — Progress Notes (Signed)
   Subjective:    Patient ID: Sherri Weber, female    DOB: 22-Oct-1933, 81 y.o.   MRN: [id number]  HPI  [facility] with diarrhea 9/12-9/14/16. Extensive stool studies revealed negative C. difficile as well as absence of other GI pathogens on stool studies. White count was normal at 5400. She had mild anemia with hematocrit of 33.1. Magnesium was low at 1.7. Urine did reveal large leukocytes.  She had positive C. difficile on 07/16/15.  She also had been seein at the urgent care 08/29/15 with blood from the colostomy stoma . Since her hospitalization she's had no GI, GU, or bleeding issues;but she feels profoundly weak and fatigued. She denies associated fever, chills, or sweats.   Review of Systems Epistaxis, hemoptysis, hematuria, melena, or rectal bleeding denied. No unexplained weight loss, significant dyspepsia,dysphagia, or abdominal pain.  There is no abnormal bruising , bleeding, or difficulty stopping bleeding with injury.    Objective:   Physical Exam  Pertinent or positive findings include: She appears younger than stated age. She has an upper dental plate. Her tongue is beefy red. Occasional premature beat is noted. Ostomy is present in the left lower quadrant. Crepitus of the knees is noted. She is weak and requires help getting on the exam table. Her gait is somewhat pigeon toed.  General appearance :adequately nourished; in no distress.  Eyes: No conjunctival inflammation or scleral icterus is present.  Oral exam:  Lips and gums are healthy appearing.There is no oropharyngeal erythema or exudate noted.   Heart: S1 and S2 normal without gallop, murmur, click, or rub    Lungs:Chest clear to auscultation; no wheezes, rhonchi,rales ,or rubs present.No increased work of breathing.   Abdomen: bowel sounds normal, soft and non-tender without masses, organomegaly or hernias noted.  No guarding or rebound.   Vascular : all pulses equal ; no bruits  present.  Skin:Warm & dry.  Intact without suspicious lesions or rashes ; no tenting or jaundice   Lymphatic: No lymphadenopathy is noted about the head, neck, axilla.   Neuro: Strength, tone decreased. DTRs normal.      Assessment & Plan:  #1 diarrhea, resolved  #2 anemia  #3 history of B12 deficiency  #4 fatigue  Plan: See orders

## 2015-09-16 NOTE — Patient Instructions (Signed)
  Your next office appointment will be determined based upon review of your pending labs. Those written interpretation of the lab results and instructions will be transmitted to you by mail for your records.  Critical results will be called.   Followup as needed for any active or acute issue. Please report any significant change in your symptoms. 

## 2015-09-16 NOTE — Progress Notes (Signed)
Pre visit review using our clinic review tool, if applicable. No additional management support is needed unless otherwise documented below in the visit note. 

## 2015-09-17 ENCOUNTER — Telehealth: Payer: Self-pay | Admitting: Internal Medicine

## 2015-09-17 NOTE — Telephone Encounter (Signed)
Pt called in and would like someone to call her as soon as the result of the test come in?

## 2015-09-17 NOTE — Telephone Encounter (Signed)
Informed pt of results.

## 2015-09-18 ENCOUNTER — Other Ambulatory Visit: Payer: Self-pay | Admitting: Internal Medicine

## 2015-09-18 DIAGNOSIS — D519 Vitamin B12 deficiency anemia, unspecified: Secondary | ICD-10-CM

## 2015-09-18 LAB — METHYLMALONIC ACID, SERUM: Methylmalonic Acid, Quant: 663 nmol/L — ABNORMAL HIGH (ref 87–318)

## 2015-09-21 ENCOUNTER — Inpatient Hospital Stay: Payer: Commercial Managed Care - HMO | Admitting: Internal Medicine

## 2015-09-22 ENCOUNTER — Telehealth: Payer: Self-pay

## 2015-09-22 ENCOUNTER — Encounter: Payer: Self-pay | Admitting: Internal Medicine

## 2015-09-22 ENCOUNTER — Ambulatory Visit (INDEPENDENT_AMBULATORY_CARE_PROVIDER_SITE_OTHER): Payer: Commercial Managed Care - HMO | Admitting: Internal Medicine

## 2015-09-22 VITALS — BP 130/70 | HR 94 | Temp 98.3°F | Resp 16 | Ht 66.0 in | Wt 126.0 lb

## 2015-09-22 DIAGNOSIS — I10 Essential (primary) hypertension: Secondary | ICD-10-CM | POA: Diagnosis not present

## 2015-09-22 DIAGNOSIS — F418 Other specified anxiety disorders: Secondary | ICD-10-CM | POA: Diagnosis not present

## 2015-09-22 DIAGNOSIS — S32009A Unspecified fracture of unspecified lumbar vertebra, initial encounter for closed fracture: Secondary | ICD-10-CM

## 2015-09-22 MED ORDER — OXYCODONE-ACETAMINOPHEN 10-325 MG PO TABS: 1.0000 | ORAL_TABLET | Freq: Every day | ORAL | Status: DC

## 2015-09-22 MED ORDER — SERTRALINE HCL 50 MG PO TABS: 50.0000 mg | ORAL_TABLET | Freq: Every day | ORAL | Status: DC

## 2015-09-22 NOTE — Telephone Encounter (Signed)
Pt requresting refill on phenergan, and wants to know if she can receive a B12 injection even tho labs came back fine. Also wondering if she could have a script for Meclizine. 1/2 tab up to three times daily. Pt was given script for dizziness and felt that it worked. Please advise

## 2015-09-22 NOTE — Patient Instructions (Signed)

## 2015-09-22 NOTE — Progress Notes (Signed)
Subjective:  Patient ID: Sherri Weber, female    DOB: 04/26/1933  Age: 81 y.o. MRN: [id number]  CC: Hypertension; Depression; and Back Pain   HPI CASH MEADOW presents for follow-up. Since I last saw her she has been seen by GI in [place] and was told that her C. difficile infection has resolved. She is now being treated for constipation. She complains of feeling weak. She wants to try a new antidepressant. She was not able to afford Trintellix. She also needs something for pain, she was not able to afford the Butrans-patch.  Outpatient Prescriptions Prior to Visit  Medication Sig Dispense Refill  . diphenoxylate-atropine (LOMOTIL) 2.5-0.025 MG per tablet Take 1 tablet by mouth 4 (four) times daily as needed for diarrhea or loose stools. 75 tablet 2  . gabapentin (NEURONTIN) 100 MG capsule See titration instructions . Goal is  TID    . iron polysaccharides (NIFEREX) 150 MG capsule Take 150 mg by mouth daily.    Marland Kitchen oxybutynin (DITROPAN) 5 MG tablet Take 1 tablet (5 mg total) by mouth 2 (two) times daily. 180 tablet 3  . saccharomyces boulardii (FLORASTOR) 250 MG capsule Take 1 capsule (250 mg total) by mouth 2 (two) times daily. 90 capsule 3  . zolpidem (AMBIEN) 10 MG tablet TAKE ONE TABLET AT BEDTIME AS NEEDED FOR SLEEP 30 tablet 3  . ALPRAZolam (XANAX) 0.5 MG tablet Take 0.5 mg by mouth.    . Buprenorphine 15 MCG/HR PTWK Place 1 Act onto the skin once a week. 4 patch 5  . losartan-hydrochlorothiazide (HYZAAR) 100-12.5 MG per tablet Take 1 tablet by mouth.    . metoprolol tartrate (LOPRESSOR) 25 MG tablet Take 1 tablet (25 mg total) by mouth 2 (two) times daily. 60 tablet 1  . promethazine (PHENERGAN) 12.5 MG tablet Take 12.5 mg by mouth.    . vancomycin (VANCOCIN) 1000 MG injection Take 125 mg by mouth.    . vancomycin (VANCOCIN) 50 mg/mL oral solution   0  . Vortioxetine HBr (TRINTELLIX) 5 MG TABS Take 1 tablet (5 mg total) by mouth daily. 30 tablet 2   No  facility-administered medications prior to visit.    ROS Review of Systems  Constitutional: Positive for fatigue. Negative for fever, chills, diaphoresis and appetite change.  HENT: Negative.   Eyes: Negative.   Respiratory: Negative.  Negative for cough, choking, shortness of breath and stridor.   Cardiovascular: Negative.  Negative for chest pain, palpitations and leg swelling.  Gastrointestinal: Positive for constipation. Negative for nausea, vomiting, abdominal pain, diarrhea, blood in stool, abdominal distention, anal bleeding and rectal pain.  Endocrine: Negative.   Genitourinary: Negative.   Musculoskeletal: Positive for back pain and arthralgias. Negative for myalgias, joint swelling and gait problem.  Skin: Negative.  Negative for pallor and rash.  Allergic/Immunologic: Negative.   Neurological: Positive for weakness.  Hematological: Negative.  Negative for adenopathy. Does not bruise/bleed easily.  Psychiatric/Behavioral: Negative.     Objective:  BP 130/70 mmHg  Pulse 94  Temp(Src) 98.3 F (36.8 C) (Oral)  Resp 16  Ht  (1.676 m)  Wt 126 lb (57.153 kg)  BMI 20.35 kg/m2  SpO2 95%  BP Readings from Last 3 Encounters:  09/22/15 130/70  09/16/15 112/66  09/01/15 118/78    Wt Readings from Last 3 Encounters:  09/22/15 126 lb (57.153 kg)  09/16/15 132 lb (59.875 kg)  09/01/15 133 lb (60.328 kg)    Physical Exam  Constitutional: She is oriented to person,  place, and time. No distress.  HENT:  Head: Normocephalic and atraumatic.  Mouth/Throat: Oropharynx is clear and moist. No oropharyngeal exudate.  Eyes: Conjunctivae are normal. Right eye exhibits no discharge. Left eye exhibits no discharge. No scleral icterus.  Neck: Normal range of motion. Neck supple. No JVD present. No tracheal deviation present. No thyromegaly present.  Cardiovascular: Normal rate, regular rhythm, normal heart sounds and intact distal pulses.  Exam reveals no gallop and no friction  rub.   No murmur heard. Pulmonary/Chest: Effort normal and breath sounds normal. No stridor. No respiratory distress. She has no wheezes. She has no rales. She exhibits no tenderness.  Abdominal: Soft. Bowel sounds are normal. She exhibits no distension and no mass. There is no tenderness. There is no rebound and no guarding.  Musculoskeletal: Normal range of motion. She exhibits edema (trace edema in BLE). She exhibits no tenderness.  Lymphadenopathy:    She has no cervical adenopathy.  Neurological: She is oriented to person, place, and time.  Skin: Skin is warm and dry. No rash noted. She is not diaphoretic. No erythema. No pallor.    Lab Results  Component Value Date   WBC 5.6 09/16/2015   HGB 12.4 09/16/2015   HCT 36.1 09/16/2015   PLT 264.0 09/16/2015   GLUCOSE 89 07/24/2015   CHOL 145 04/30/2015   TRIG 276.0* 04/30/2015   HDL 23.40* 04/30/2015   LDLDIRECT 75.0 04/30/2015   LDLCALC 122* 02/24/2014   ALT 10 07/24/2015   AST 14 07/24/2015   NA 142 07/24/2015   K 4.9 07/24/2015   CL 107 07/24/2015   CREATININE 0.89 07/24/2015   BUN 12 07/24/2015   CO2 30 07/24/2015   TSH 2.71 09/16/2015   INR 0.95 04/28/2013    Dg Chest 2 View  05/08/2014   CLINICAL DATA:  Left-sided pain post fall  EXAM: CHEST  2 VIEW  COMPARISON:  04/28/2013.  FINDINGS: Cardiomediastinal silhouette is stable. Bronchiectasis right infrahilar region again noted. No acute infiltrate or pulmonary edema. There is cortical step-off left sixth rib suspicious for fracture. Clinical correlation is necessary. No pneumothorax. Mild degenerative changes thoracic spine.  IMPRESSION: Bronchiectasis right infrahilar region again noted. No acute infiltrate or pulmonary edema. There is cortical step-off left sixth rib suspicious for fracture. Clinical correlation is necessary. No pneumothorax. Mild degenerative changes thoracic spine.   Electronically Signed   By: Natasha Mead M.D.   On: 05/08/2014 09:14    Assessment &  Plan:   Lanie was seen today for hypertension, depression and back pain.  Diagnoses and all orders for this visit:  Essential hypertension- her blood pressure is well-controlled  Lumbar vertebral fracture, closed, initial encounter- we'll continue Percocet as needed for pain  Depression with anxiety- she will start taking sertraline -     sertraline (ZOLOFT) 50 MG tablet; Take 1 tablet (50 mg total) by mouth daily.  Other orders -     oxyCODONE-acetaminophen (PERCOCET) 10-325 MG tablet; Take 1 tablet by mouth daily.   I have discontinued Ms. Milbourn's losartan-hydrochlorothiazide, metoprolol tartrate, vancomycin, ALPRAZolam, promethazine, vancomycin, Vortioxetine HBr, and Buprenorphine. I have also changed her oxyCODONE-acetaminophen. Additionally, I am having her start on sertraline. Lastly, I am having her maintain her iron polysaccharides, saccharomyces boulardii, zolpidem, oxybutynin, gabapentin, diphenoxylate-atropine, meclizine, loperamide, and LORazepam.  Meds ordered this encounter  Medications  . FIBER, GUAR GUM, PO    Sig: Take 1 packet by mouth.  . meclizine (ANTIVERT) 25 MG tablet    Sig: Take 25 mg  by mouth.  . DISCONTD: oxyCODONE-acetaminophen (PERCOCET) 10-325 MG tablet    Sig: Take 1 tablet by mouth.  . loperamide (IMODIUM) 2 MG capsule    Sig:   . LORazepam (ATIVAN) 0.5 MG tablet    Sig: Take 0.5 mg by mouth 2 (two) times daily as needed.    Refill:  5  . sertraline (ZOLOFT) 50 MG tablet    Sig: Take 1 tablet (50 mg total) by mouth daily.    Dispense:  30 tablet    Refill:  3  . oxyCODONE-acetaminophen (PERCOCET) 10-325 MG tablet    Sig: Take 1 tablet by mouth daily.    Dispense:  30 tablet    Refill:  0     Follow-up: Return in about 3 months (around 12/22/2015).  Sanda Linger, MD

## 2015-09-22 NOTE — Progress Notes (Signed)
Pre visit review using our clinic review tool, if applicable. No additional management support is needed unless otherwise documented below in the visit note. 

## 2015-09-22 NOTE — Telephone Encounter (Signed)
No, none of these are appropriate requests

## 2015-09-23 NOTE — Telephone Encounter (Signed)
Patient called in. Advised of below. She became irate.   - she stated that she is upset that only 30 percocet were RX'ed instead of 75. She states that it is not enough. Advised the script is for 1 per day. She advised that she takes at least 2 because she is in pain. - she states that she needs an antibiotic because she has "infection in her body"

## 2015-09-24 ENCOUNTER — Encounter: Payer: Self-pay | Admitting: Internal Medicine

## 2015-09-28 ENCOUNTER — Other Ambulatory Visit: Payer: Self-pay | Admitting: Internal Medicine

## 2015-09-29 ENCOUNTER — Telehealth: Payer: Self-pay | Admitting: Internal Medicine

## 2015-09-29 NOTE — Telephone Encounter (Signed)
Patient called regarding the refill request of LORazepam (ATIVAN) 0.5 MG tablet [[id number]  Also, when she was in the office last week, her prescription for oxyCODONE-acetaminophen (PERCOCET) 10-325 MG tablet [[id number]] was only written for 1 a day when he usually writes them for 2 a day. She says she will need more than that.

## 2015-09-30 NOTE — Telephone Encounter (Signed)
Patient is calling back regarding note below °

## 2015-10-01 NOTE — Telephone Encounter (Signed)
Dr. Yetta Barre d/c Keturah Shavers 06/23/15 OV due to UDS for xanax and ativan at the same time. Will address percocet rf

## 2015-10-05 ENCOUNTER — Other Ambulatory Visit: Payer: Self-pay | Admitting: Internal Medicine

## 2015-10-05 ENCOUNTER — Telehealth: Payer: Self-pay | Admitting: *Deleted

## 2015-10-05 NOTE — Telephone Encounter (Signed)
Left msg on triage requesting refill on her Lorazepam.../lmb 

## 2015-10-05 NOTE — Telephone Encounter (Signed)
My answer is no to both of these at this time It appears that someone in [place] is also prescribing for her

## 2015-10-05 NOTE — Telephone Encounter (Signed)
Please advise on how to proceed. Pt has called back does she need to bring original pill bottle in for another script on the percocet

## 2015-10-06 NOTE — Telephone Encounter (Signed)
Pt has been made aware md denied refilling Lorazepam.../lmb

## 2015-10-06 NOTE — Telephone Encounter (Signed)
no

## 2015-10-06 NOTE — Telephone Encounter (Signed)
Pls advise on msg below.../lmb 

## 2015-10-07 ENCOUNTER — Other Ambulatory Visit: Payer: Self-pay | Admitting: Internal Medicine

## 2015-10-13 ENCOUNTER — Ambulatory Visit (INDEPENDENT_AMBULATORY_CARE_PROVIDER_SITE_OTHER): Payer: Commercial Managed Care - HMO | Admitting: Internal Medicine

## 2015-10-13 ENCOUNTER — Encounter: Payer: Self-pay | Admitting: Internal Medicine

## 2015-10-13 VITALS — BP 118/72 | HR 65 | Temp 98.2°F | Resp 16 | Ht 66.0 in | Wt 129.0 lb

## 2015-10-13 DIAGNOSIS — G8929 Other chronic pain: Secondary | ICD-10-CM

## 2015-10-13 DIAGNOSIS — M8949 Other hypertrophic osteoarthropathy, multiple sites: Secondary | ICD-10-CM

## 2015-10-13 DIAGNOSIS — M150 Primary generalized (osteo)arthritis: Secondary | ICD-10-CM | POA: Diagnosis not present

## 2015-10-13 DIAGNOSIS — M545 Low back pain, unspecified: Secondary | ICD-10-CM

## 2015-10-13 DIAGNOSIS — M159 Polyosteoarthritis, unspecified: Secondary | ICD-10-CM

## 2015-10-13 DIAGNOSIS — F418 Other specified anxiety disorders: Secondary | ICD-10-CM

## 2015-10-13 DIAGNOSIS — I10 Essential (primary) hypertension: Secondary | ICD-10-CM

## 2015-10-13 MED ORDER — FENTANYL 12 MCG/HR TD PT72: 12.5000 ug | MEDICATED_PATCH | TRANSDERMAL | Status: DC

## 2015-10-13 MED ORDER — LORAZEPAM 0.5 MG PO TABS: 0.5000 mg | ORAL_TABLET | Freq: Three times a day (TID) | ORAL | Status: DC | PRN

## 2015-10-13 MED ORDER — DULOXETINE HCL 60 MG PO CPEP: 60.0000 mg | ORAL_CAPSULE | Freq: Every day | ORAL | Status: DC

## 2015-10-13 NOTE — Progress Notes (Signed)
Subjective:  Patient ID: Sherri Weber, female    DOB: 1933-03-21  Age: 81 y.o. MRN: [id number]  CC: Osteoarthritis and Back Pain   HPI Sherri Weber presents for f/up - she has complained that percocet caused dizziness so she has stopped taking it and the dizziness has resolved. She wants something different for the pain. She also requests something for depression and anxiety.  Outpatient Prescriptions Prior to Visit  Medication Sig Dispense Refill  . diphenoxylate-atropine (LOMOTIL) 2.5-0.025 MG per tablet Take 1 tablet by mouth 4 (four) times daily as needed for diarrhea or loose stools. 75 tablet 2  . gabapentin (NEURONTIN) 100 MG capsule See titration instructions . Goal is 200mg  TID    . iron polysaccharides (NIFEREX) 150 MG capsule Take 150 mg by mouth daily.    Marland Kitchen. loperamide (IMODIUM) 2 MG capsule     . oxybutynin (DITROPAN) 5 MG tablet Take 1 tablet (5 mg total) by mouth 2 (two) times daily. 180 tablet 3  . saccharomyces boulardii (FLORASTOR) 250 MG capsule Take 1 capsule (250 mg total) by mouth 2 (two) times daily. 90 capsule 3  . zolpidem (AMBIEN) 10 MG tablet TAKE 1 TABLET AT BEDTIME AS NEEDED FOR SLEEP 30 tablet 3  . LORazepam (ATIVAN) 0.5 MG tablet Take 0.5 mg by mouth 2 (two) times daily as needed.  5  . meclizine (ANTIVERT) 25 MG tablet Take 25 mg by mouth.    . oxyCODONE-acetaminophen (PERCOCET) 10-325 MG tablet Take 1 tablet by mouth daily. 30 tablet 0  . sertraline (ZOLOFT) 50 MG tablet Take 1 tablet (50 mg total) by mouth daily. 30 tablet 3   No facility-administered medications prior to visit.    ROS Review of Systems  Constitutional: Negative.  Negative for fever, chills, diaphoresis, appetite change and fatigue.  HENT: Negative.   Eyes: Negative.   Respiratory: Negative.  Negative for apnea, cough, choking, chest tightness, shortness of breath and stridor.   Cardiovascular: Negative.  Negative for chest pain, palpitations and leg swelling.    Gastrointestinal: Negative.  Negative for nausea, vomiting, abdominal pain, diarrhea, constipation and blood in stool.  Endocrine: Negative.   Genitourinary: Negative.   Musculoskeletal: Positive for back pain and arthralgias. Negative for myalgias, joint swelling, gait problem, neck pain and neck stiffness.  Skin: Negative.  Negative for rash.  Allergic/Immunologic: Negative.   Neurological: Negative.  Negative for dizziness, tremors, seizures, syncope, speech difficulty, weakness, light-headedness, numbness and headaches.  Hematological: Negative.  Negative for adenopathy. Does not bruise/bleed easily.  Psychiatric/Behavioral: Positive for sleep disturbance and dysphoric mood. Negative for suicidal ideas, hallucinations, confusion, self-injury, decreased concentration and agitation. The patient is nervous/anxious. The patient is not hyperactive.     Objective:  BP 118/72 mmHg  Pulse 65  Temp(Src) 98.2 F (36.8 C) (Oral)  Resp 16  Ht 5\' 6"  (1.676 m)  Wt 129 lb (58.514 kg)  BMI 20.83 kg/m2  SpO2 95%  BP Readings from Last 3 Encounters:  10/13/15 118/72  09/22/15 130/70  09/16/15 112/66    Wt Readings from Last 3 Encounters:  10/13/15 129 lb (58.514 kg)  09/22/15 126 lb (57.153 kg)  09/16/15 132 lb (59.875 kg)    Physical Exam  Constitutional: She is oriented to person, place, and time. No distress.  HENT:  Head: Normocephalic and atraumatic.  Mouth/Throat: Oropharynx is clear and moist. No oropharyngeal exudate.  Eyes: Conjunctivae are normal. Right eye exhibits no discharge. Left eye exhibits no discharge. No scleral icterus.  Neck:  Normal range of motion. Neck supple. No JVD present. No tracheal deviation present. No thyromegaly present.  Cardiovascular: Normal rate, regular rhythm, normal heart sounds and intact distal pulses.  Exam reveals no gallop and no friction rub.   No murmur heard. Pulmonary/Chest: Effort normal and breath sounds normal. No stridor. No  respiratory distress. She has no wheezes. She has no rales. She exhibits no tenderness.  Abdominal: Soft. Bowel sounds are normal. She exhibits no distension and no mass. There is no tenderness. There is no rebound and no guarding.  Musculoskeletal: Normal range of motion. She exhibits no edema or tenderness.  Lymphadenopathy:    She has no cervical adenopathy.  Neurological: She is oriented to person, place, and time.  Skin: Skin is warm and dry. No rash noted. She is not diaphoretic. No erythema. No pallor.  Psychiatric: Her behavior is normal. Judgment and thought content normal. Her mood appears anxious. Her affect is not angry, not blunt, not labile and not inappropriate. Her speech is not rapid and/or pressured, not delayed, not tangential and not slurred. She is not agitated, not slowed and not withdrawn. She exhibits a depressed mood. She expresses no homicidal and no suicidal ideation. She expresses no suicidal plans and no homicidal plans. She is communicative. She is attentive.  Vitals reviewed.   Lab Results  Component Value Date   WBC 5.6 09/16/2015   HGB 12.4 09/16/2015   HCT 36.1 09/16/2015   PLT 264.0 09/16/2015   GLUCOSE 89 07/24/2015   CHOL 145 04/30/2015   TRIG 276.0* 04/30/2015   HDL 23.40* 04/30/2015   LDLDIRECT 75.0 04/30/2015   LDLCALC 122* 02/24/2014   ALT 10 07/24/2015   AST 14 07/24/2015   NA 142 07/24/2015   K 4.9 07/24/2015   CL 107 07/24/2015   CREATININE 0.89 07/24/2015   BUN 12 07/24/2015   CO2 30 07/24/2015   TSH 2.71 09/16/2015   INR 0.95 04/28/2013    Dg Chest 2 View  05/08/2014  CLINICAL DATA:  Left-sided pain post fall EXAM: CHEST  2 VIEW COMPARISON:  04/28/2013. FINDINGS: Cardiomediastinal silhouette is stable. Bronchiectasis right infrahilar region again noted. No acute infiltrate or pulmonary edema. There is cortical step-off left sixth rib suspicious for fracture. Clinical correlation is necessary. No pneumothorax. Mild degenerative changes  thoracic spine. IMPRESSION: Bronchiectasis right infrahilar region again noted. No acute infiltrate or pulmonary edema. There is cortical step-off left sixth rib suspicious for fracture. Clinical correlation is necessary. No pneumothorax. Mild degenerative changes thoracic spine. Electronically Signed   By: Natasha Mead M.D.   On: 05/08/2014 09:14    Assessment & Plan:   [place] was seen today for osteoarthritis and back pain.  Diagnoses and all orders for this visit:  Essential hypertension- ner BP is well controlled  Chronic low back pain without sciatica, unspecified back pain laterality- stop percocet and control pain with a duragesic patch -     DULoxetine (CYMBALTA) 60 MG capsule; Take 1 capsule (60 mg total) by mouth daily. -     Discontinue: fentaNYL (DURAGESIC - DOSED MCG/HR) 12 MCG/HR; Place 1 patch (12.5 mcg total) onto the skin every 3 (three) days. -     Discontinue: fentaNYL (DURAGESIC - DOSED MCG/HR) 12 MCG/HR; Place 1 patch (12.5 mcg total) onto the skin every 3 (three) days. -     fentaNYL (DURAGESIC - DOSED MCG/HR) 12 MCG/HR; Place 1 patch (12.5 mcg total) onto the skin every 3 (three) days.  Depression with anxiety- she did not  get any benefit with sertraline, will try cymbalta and will cont ativan as needed -     LORazepam (ATIVAN) 0.5 MG tablet; Take 1 tablet (0.5 mg total) by mouth every 8 (eight) hours as needed. -     DULoxetine (CYMBALTA) 60 MG capsule; Take 1 capsule (60 mg total) by mouth daily.  Primary osteoarthritis involving multiple joints- will control the pain with a duragesic patch -     DULoxetine (CYMBALTA) 60 MG capsule; Take 1 capsule (60 mg total) by mouth daily. -     Discontinue: fentaNYL (DURAGESIC - DOSED MCG/HR) 12 MCG/HR; Place 1 patch (12.5 mcg total) onto the skin every 3 (three) days. -     Discontinue: fentaNYL (DURAGESIC - DOSED MCG/HR) 12 MCG/HR; Place 1 patch (12.5 mcg total) onto the skin every 3 (three) days. -     fentaNYL (DURAGESIC -  DOSED MCG/HR) 12 MCG/HR; Place 1 patch (12.5 mcg total) onto the skin every 3 (three) days.   I have discontinued Ms. Pirie's meclizine, sertraline, and oxyCODONE-acetaminophen. I have also changed her LORazepam. Additionally, I am having her start on DULoxetine. Lastly, I am having her maintain her iron polysaccharides, saccharomyces boulardii, oxybutynin, gabapentin, diphenoxylate-atropine, loperamide, zolpidem, GAVILYTE-G, and fentaNYL.  Meds ordered this encounter  Medications  . GAVILYTE-G 236 G solution    Sig:   . LORazepam (ATIVAN) 0.5 MG tablet    Sig: Take 1 tablet (0.5 mg total) by mouth every 8 (eight) hours as needed.    Dispense:  90 tablet    Refill:  3  . DULoxetine (CYMBALTA) 60 MG capsule    Sig: Take 1 capsule (60 mg total) by mouth daily.    Dispense:  30 capsule    Refill:  5  . DISCONTD: fentaNYL (DURAGESIC - DOSED MCG/HR) 12 MCG/HR    Sig: Place 1 patch (12.5 mcg total) onto the skin every 3 (three) days.    Dispense:  10 patch    Refill:  0    Fill on or after 10/13/15  . DISCONTD: fentaNYL (DURAGESIC - DOSED MCG/HR) 12 MCG/HR    Sig: Place 1 patch (12.5 mcg total) onto the skin every 3 (three) days.    Dispense:  10 patch    Refill:  0    Fill on or after 11/13/15  . fentaNYL (DURAGESIC - DOSED MCG/HR) 12 MCG/HR    Sig: Place 1 patch (12.5 mcg total) onto the skin every 3 (three) days.    Dispense:  10 patch    Refill:  0    Fill on or after 12/13/15     Follow-up: Return in about 3 months (around 01/13/2016).  Sanda Linger, MD

## 2015-10-13 NOTE — Progress Notes (Signed)
Pre visit review using our clinic review tool, if applicable. No additional management support is needed unless otherwise documented below in the visit note. 

## 2015-10-14 ENCOUNTER — Telehealth: Payer: Self-pay | Admitting: Internal Medicine

## 2015-10-14 DIAGNOSIS — M150 Primary generalized (osteo)arthritis: Principal | ICD-10-CM

## 2015-10-14 DIAGNOSIS — M545 Low back pain, unspecified: Secondary | ICD-10-CM

## 2015-10-14 DIAGNOSIS — M8949 Other hypertrophic osteoarthropathy, multiple sites: Secondary | ICD-10-CM

## 2015-10-14 DIAGNOSIS — G8929 Other chronic pain: Secondary | ICD-10-CM

## 2015-10-14 DIAGNOSIS — M159 Polyosteoarthritis, unspecified: Secondary | ICD-10-CM

## 2015-10-14 NOTE — Telephone Encounter (Signed)
Pt called in and said that pain patches are to expensive and she wants a pill.  She said that she can not afford them.  They are $100.00 plus for just 10.    Best number [phone]

## 2015-10-14 NOTE — Telephone Encounter (Signed)
Please advise 

## 2015-10-15 MED ORDER — HYDROCODONE-ACETAMINOPHEN 5-325 MG PO TABS
1.0000 | ORAL_TABLET | Freq: Four times a day (QID) | ORAL | Status: DC | PRN
Start: 2015-10-15 — End: 2015-11-13

## 2015-10-15 NOTE — Telephone Encounter (Signed)
Script t front pt informed. Tamm bitts (friend ) will pick it up

## 2015-10-15 NOTE — Telephone Encounter (Signed)
New Rx written

## 2015-11-13 ENCOUNTER — Other Ambulatory Visit: Payer: Self-pay | Admitting: Internal Medicine

## 2015-11-13 DIAGNOSIS — M8949 Other hypertrophic osteoarthropathy, multiple sites: Secondary | ICD-10-CM

## 2015-11-13 DIAGNOSIS — M159 Polyosteoarthritis, unspecified: Secondary | ICD-10-CM

## 2015-11-13 DIAGNOSIS — M545 Low back pain, unspecified: Secondary | ICD-10-CM

## 2015-11-13 DIAGNOSIS — M150 Primary generalized (osteo)arthritis: Principal | ICD-10-CM

## 2015-11-13 DIAGNOSIS — G8929 Other chronic pain: Secondary | ICD-10-CM

## 2015-11-13 MED ORDER — HYDROCODONE-ACETAMINOPHEN 5-325 MG PO TABS: 1.0000 | ORAL_TABLET | Freq: Four times a day (QID) | ORAL | Status: DC | PRN

## 2015-11-13 NOTE — Telephone Encounter (Signed)
Ok to fill 

## 2015-11-13 NOTE — Telephone Encounter (Signed)
Patient requesting refill for HYDROcodone-acetaminophen (NORCO/VICODIN) 5-325 MG tablet [[id number]][[id number]]  Please call when ready She states she called yesterday and left a message with someone.

## 2015-11-16 ENCOUNTER — Other Ambulatory Visit: Payer: Self-pay | Admitting: Internal Medicine

## 2015-11-16 ENCOUNTER — Telehealth: Payer: Self-pay

## 2015-11-16 DIAGNOSIS — M150 Primary generalized (osteo)arthritis: Principal | ICD-10-CM

## 2015-11-16 DIAGNOSIS — M545 Low back pain, unspecified: Secondary | ICD-10-CM

## 2015-11-16 DIAGNOSIS — M8949 Other hypertrophic osteoarthropathy, multiple sites: Secondary | ICD-10-CM

## 2015-11-16 DIAGNOSIS — M159 Polyosteoarthritis, unspecified: Secondary | ICD-10-CM

## 2015-11-16 DIAGNOSIS — G8929 Other chronic pain: Secondary | ICD-10-CM

## 2015-11-16 MED ORDER — HYDROCODONE-ACETAMINOPHEN 5-325 MG PO TABS: 1.0000 | ORAL_TABLET | Freq: Four times a day (QID) | ORAL | Status: DC | PRN

## 2015-11-16 NOTE — Telephone Encounter (Signed)
Patient called and said that her prescription for her pain medication was never sent and she never picked it up. Dr. Yetta BarreJones wrote her another prescription today, and her friend came by to pick it up.

## 2015-11-17 NOTE — Telephone Encounter (Signed)
Calling to inform pt of script to be picked up voicemail box full

## 2015-11-24 ENCOUNTER — Ambulatory Visit: Payer: Commercial Managed Care - HMO | Admitting: Internal Medicine

## 2015-11-24 DIAGNOSIS — Z0289 Encounter for other administrative examinations: Secondary | ICD-10-CM

## 2015-11-30 ENCOUNTER — Ambulatory Visit (INDEPENDENT_AMBULATORY_CARE_PROVIDER_SITE_OTHER): Payer: Commercial Managed Care - HMO | Admitting: Internal Medicine

## 2015-11-30 ENCOUNTER — Encounter: Payer: Self-pay | Admitting: Internal Medicine

## 2015-11-30 VITALS — BP 138/90 | HR 69 | Temp 98.5°F | Resp 20 | Ht 65.0 in | Wt 130.5 lb

## 2015-11-30 DIAGNOSIS — S32810D Multiple fractures of pelvis with stable disruption of pelvic ring, subsequent encounter for fracture with routine healing: Secondary | ICD-10-CM

## 2015-11-30 DIAGNOSIS — M8080XS Other osteoporosis with current pathological fracture, unspecified site, sequela: Secondary | ICD-10-CM

## 2015-11-30 DIAGNOSIS — I10 Essential (primary) hypertension: Secondary | ICD-10-CM

## 2015-11-30 DIAGNOSIS — F409 Phobic anxiety disorder, unspecified: Secondary | ICD-10-CM

## 2015-11-30 DIAGNOSIS — F5105 Insomnia due to other mental disorder: Secondary | ICD-10-CM

## 2015-11-30 MED ORDER — SUVOREXANT 15 MG PO TABS: 1.0000 | ORAL_TABLET | Freq: Every evening | ORAL | Status: DC | PRN

## 2015-11-30 NOTE — Progress Notes (Signed)
Subjective:  Patient ID: Sherri Weber, female    DOB: 1933-08-07  Age: 81 y.o. MRN: [id number]  CC: Osteoarthritis and Hypertension   HPI Sherri Weber presents for follow-up. Since I last saw her, about 5 or 6 weeks ago, she sustained a fall in her home and was seen at another institution. She was diagnosed with pelvic fractures. She is followed by orthopedic surgery and she tells me that she is improving. She is getting pain relief with hydrocodone. Today she complains of persistent insomnia and says Ambien is not helping.  Outpatient Prescriptions Prior to Visit  Medication Sig Dispense Refill  . diphenoxylate-atropine (LOMOTIL) 2.5-0.025 MG per tablet Take 1 tablet by mouth 4 (four) times daily as needed for diarrhea or loose stools. 75 tablet 2  . DULoxetine (CYMBALTA) 60 MG capsule Take 1 capsule (60 mg total) by mouth daily. 30 capsule 5  . gabapentin (NEURONTIN) 100 MG capsule See titration instructions . Goal is  TID    . GAVILYTE-G 236 G solution     . HYDROcodone-acetaminophen (NORCO/VICODIN) 5-325 MG tablet Take 1 tablet by mouth every 6 (six) hours as needed for moderate pain. 75 tablet 0  . iron polysaccharides (NIFEREX) 150 MG capsule Take 150 mg by mouth daily.    Marland Kitchen loperamide (IMODIUM) 2 MG capsule     . LORazepam (ATIVAN) 0.5 MG tablet Take 1 tablet (0.5 mg total) by mouth every 8 (eight) hours as needed. 90 tablet 3  . oxybutynin (DITROPAN) 5 MG tablet Take 1 tablet (5 mg total) by mouth 2 (two) times daily. 180 tablet 3  . saccharomyces boulardii (FLORASTOR) 250 MG capsule Take 1 capsule (250 mg total) by mouth 2 (two) times daily. 90 capsule 3  . zolpidem (AMBIEN) 10 MG tablet TAKE 1 TABLET AT BEDTIME AS NEEDED FOR SLEEP 30 tablet 3   No facility-administered medications prior to visit.    ROS Review of Systems  Constitutional: Negative.  Negative for fever, chills, diaphoresis, appetite change and fatigue.  HENT: Negative.   Eyes: Negative.     Respiratory: Negative.  Negative for cough, choking, chest tightness, shortness of breath and stridor.   Cardiovascular: Negative.  Negative for chest pain, palpitations and leg swelling.  Gastrointestinal: Negative.  Negative for nausea, vomiting, abdominal pain, diarrhea, constipation and blood in stool.  Endocrine: Negative.   Genitourinary: Positive for pelvic pain. Negative for urgency, hematuria, decreased urine volume and difficulty urinating.  Musculoskeletal: Positive for back pain and arthralgias. Negative for myalgias, joint swelling, gait problem, neck pain and neck stiffness.  Skin: Negative.  Negative for color change and rash.  Allergic/Immunologic: Negative.   Neurological: Negative.  Negative for dizziness and light-headedness.  Hematological: Negative.  Negative for adenopathy. Does not bruise/bleed easily.  Psychiatric/Behavioral: Positive for sleep disturbance and dysphoric mood. Negative for suicidal ideas, behavioral problems, confusion, self-injury and decreased concentration. The patient is nervous/anxious.     Objective:  BP 138/90 mmHg  Pulse 69  Temp(Src) 98.5 F (36.9 C) (Oral)  Resp 20  Ht  (1.651 m)  Wt 130 lb 8 oz (59.194 kg)  BMI 21.72 kg/m2  SpO2 97%  BP Readings from Last 3 Encounters:  11/30/15 138/90  10/13/15 118/72  09/22/15 130/70    Wt Readings from Last 3 Encounters:  11/30/15 130 lb 8 oz (59.194 kg)  10/13/15 129 lb (58.514 kg)  09/22/15 126 lb (57.153 kg)    Physical Exam  Constitutional: She is oriented to person, place, and  time. No distress.  HENT:  Head: Normocephalic and atraumatic.  Mouth/Throat: Oropharynx is clear and moist. No oropharyngeal exudate.  Eyes: Conjunctivae are normal. Right eye exhibits no discharge. Left eye exhibits no discharge. No scleral icterus.  Neck: Normal range of motion. Neck supple. No JVD present. No tracheal deviation present. No thyromegaly present.  Cardiovascular: Normal rate, regular  rhythm, normal heart sounds and intact distal pulses.  Exam reveals no gallop and no friction rub.   No murmur heard. Pulmonary/Chest: Effort normal and breath sounds normal. No stridor. No respiratory distress. She has no wheezes. She has no rales. She exhibits no tenderness.  Abdominal: Soft. Bowel sounds are normal. She exhibits no distension and no mass. There is no tenderness. There is no rebound and no guarding.  Musculoskeletal: Normal range of motion. She exhibits no edema or tenderness.  Lymphadenopathy:    She has no cervical adenopathy.  Neurological: She is oriented to person, place, and time.  Skin: Skin is warm and dry. No rash noted. She is not diaphoretic. No erythema. No pallor.  Vitals reviewed.   Lab Results  Component Value Date   WBC 5.6 09/16/2015   HGB 12.4 09/16/2015   HCT 36.1 09/16/2015   PLT 264.0 09/16/2015   GLUCOSE 89 07/24/2015   CHOL 145 04/30/2015   TRIG 276.0* 04/30/2015   HDL 23.40* 04/30/2015   LDLDIRECT 75.0 04/30/2015   LDLCALC 122* 02/24/2014   ALT 10 07/24/2015   AST 14 07/24/2015   NA 142 07/24/2015   K 4.9 07/24/2015   CL 107 07/24/2015   CREATININE 0.89 07/24/2015   BUN 12 07/24/2015   CO2 30 07/24/2015   TSH 2.71 09/16/2015   INR 0.95 04/28/2013    Dg Chest 2 View  05/08/2014  CLINICAL DATA:  Left-sided pain post fall EXAM: CHEST  2 VIEW COMPARISON:  04/28/2013. FINDINGS: Cardiomediastinal silhouette is stable. Bronchiectasis right infrahilar region again noted. No acute infiltrate or pulmonary edema. There is cortical step-off left sixth rib suspicious for fracture. Clinical correlation is necessary. No pneumothorax. Mild degenerative changes thoracic spine. IMPRESSION: Bronchiectasis right infrahilar region again noted. No acute infiltrate or pulmonary edema. There is cortical step-off left sixth rib suspicious for fracture. Clinical correlation is necessary. No pneumothorax. Mild degenerative changes thoracic spine. Electronically  Signed   By: Natasha Mead M.D.   On: 05/08/2014 09:14    Assessment & Plan:   Sherri Weber was seen today for osteoarthritis and hypertension.  Diagnoses and all orders for this visit:  Osteoporosis with fracture, sequela- I'm concerned she may have osteoporosis that needs to be treated. It appears it has been several years since she had a bone mineral density scan. I've asked her to have an updated bone mineral density performed. -     DG Bone Density; Future  Essential hypertension- her blood pressure is well-controlled  Insomnia due to anxiety and fear- she will discontinue Ambien and will try Belsomra for insomnia. -     Suvorexant (BELSOMRA) 15 MG TABS; Take 1 tablet by mouth at bedtime as needed.  Multiple pelvic fractures, with routine healing, subsequent encounter   I have discontinued Ms. Shiffler's zolpidem. I am also having her start on Suvorexant. Additionally, I am having her maintain her iron polysaccharides, saccharomyces boulardii, oxybutynin, gabapentin, diphenoxylate-atropine, loperamide, GAVILYTE-G, LORazepam, DULoxetine, and HYDROcodone-acetaminophen.  Meds ordered this encounter  Medications  . Suvorexant (BELSOMRA) 15 MG TABS    Sig: Take 1 tablet by mouth at bedtime as needed.  Dispense:  30 tablet    Refill:  5     Follow-up: Return in about 6 months (around 05/30/2016).  Sanda Lingerhomas Fedra Lanter, MD

## 2015-11-30 NOTE — Patient Instructions (Signed)
Insomnia Insomnia is a sleep disorder that makes it difficult to fall asleep or to stay asleep. Insomnia can cause tiredness (fatigue), low energy, difficulty concentrating, mood swings, and poor performance at work or school.  There are three different ways to classify insomnia:  Difficulty falling asleep.  Difficulty staying asleep.  Waking up too early in the morning. Any type of insomnia can be long-term (chronic) or short-term (acute). Both are common. Short-term insomnia usually lasts for three months or less. Chronic insomnia occurs at least three times a week for longer than three months. CAUSES  Insomnia may be caused by another condition, situation, or substance, such as:  Anxiety.  Certain medicines.  Gastroesophageal reflux disease (GERD) or other gastrointestinal conditions.  Asthma or other breathing conditions.  Restless legs syndrome, sleep apnea, or other sleep disorders.  Chronic pain.  Menopause. This may include hot flashes.  Stroke.  Abuse of alcohol, tobacco, or illegal drugs.  Depression.  Caffeine.   Neurological disorders, such as Alzheimer disease.  An overactive thyroid (hyperthyroidism). The cause of insomnia may not be known. RISK FACTORS Risk factors for insomnia include:  Gender. Women are more commonly affected than men.  Age. Insomnia is more common as you get older.  Stress. This may involve your professional or personal life.  Income. Insomnia is more common in people with lower income.  Lack of exercise.   Irregular work schedule or night shifts.  Traveling between different time zones. SIGNS AND SYMPTOMS If you have insomnia, trouble falling asleep or trouble staying asleep is the main symptom. This may lead to other symptoms, such as:  Feeling fatigued.  Feeling nervous about going to sleep.  Not feeling rested in the morning.  Having trouble concentrating.  Feeling irritable, anxious, or depressed. TREATMENT   Treatment for insomnia depends on the cause. If your insomnia is caused by an underlying condition, treatment will focus on addressing the condition. Treatment may also include:   Medicines to help you sleep.  Counseling or therapy.  Lifestyle adjustments. HOME CARE INSTRUCTIONS   Take medicines only as directed by your health care provider.  Keep regular sleeping and waking hours. Avoid naps.  Keep a sleep diary to help you and your health care provider figure out what could be causing your insomnia. Include:   When you sleep.  When you wake up during the night.  How well you sleep.   How rested you feel the next day.  Any side effects of medicines you are taking.  What you eat and drink.   Make your bedroom a comfortable place where it is easy to fall asleep:  Put up shades or special blackout curtains to block light from outside.  Use a white noise machine to block noise.  Keep the temperature cool.   Exercise regularly as directed by your health care provider. Avoid exercising right before bedtime.  Use relaxation techniques to manage stress. Ask your health care provider to suggest some techniques that may work well for you. These may include:  Breathing exercises.  Routines to release muscle tension.  Visualizing peaceful scenes.  Cut back on alcohol, caffeinated beverages, and cigarettes, especially close to bedtime. These can disrupt your sleep.  Do not overeat or eat spicy foods right before bedtime. This can lead to digestive discomfort that can make it hard for you to sleep.  Limit screen use before bedtime. This includes:  Watching TV.  Using your smartphone, tablet, and computer.  Stick to a routine. This   can help you fall asleep faster. Try to do a quiet activity, brush your teeth, and go to bed at the same time each night.  Get out of bed if you are still awake after 15 minutes of trying to sleep. Keep the lights down, but try reading or  doing a quiet activity. When you feel sleepy, go back to bed.  Make sure that you drive carefully. Avoid driving if you feel very sleepy.  Keep all follow-up appointments as directed by your health care provider. This is important. SEEK MEDICAL CARE IF:   You are tired throughout the day or have trouble in your daily routine due to sleepiness.  You continue to have sleep problems or your sleep problems get worse. SEEK IMMEDIATE MEDICAL CARE IF:   You have serious thoughts about hurting yourself or someone else.   This information is not intended to replace advice given to you by your health care provider. Make sure you discuss any questions you have with your health care provider.   Document Released: 12/09/2000 Document Revised: 09/02/2015 Document Reviewed: 09/12/2014 Elsevier Interactive Patient Education 2016 [employer]  

## 2015-11-30 NOTE — Progress Notes (Signed)
Pre visit review using our clinic review tool, if applicable. No additional management support is needed unless otherwise documented below in the visit note. 

## 2015-12-02 ENCOUNTER — Telehealth: Payer: Self-pay | Admitting: Internal Medicine

## 2015-12-02 DIAGNOSIS — M8080XS Other osteoporosis with current pathological fracture, unspecified site, sequela: Secondary | ICD-10-CM

## 2015-12-02 NOTE — Telephone Encounter (Signed)
Pt was in earlier this week and she states she fell in October and her back and rectum are hurting her and she is wondering if you can call her in a muscle relaxer. She said she forgot to bring it up in the appt. She also says there was discussion about prescribing calcium for her bones since she had a fractured hip a few years ago and there was nothing called in.

## 2015-12-03 MED ORDER — CALCIUM CARBONATE-VITAMIN D 500-200 MG-UNIT PO TABS: 1.0000 | ORAL_TABLET | Freq: Two times a day (BID) | ORAL | Status: AC

## 2015-12-03 NOTE — Telephone Encounter (Signed)
Rx for calcium/Vit D sent I don't think she should start a muscle relaxant

## 2015-12-03 NOTE — Telephone Encounter (Signed)
Left msg on triage concerning msg below...Sherri Weber/lmb

## 2015-12-03 NOTE — Telephone Encounter (Signed)
Notified pt with md response.../lmb 

## 2015-12-14 ENCOUNTER — Telehealth: Payer: Self-pay | Admitting: Internal Medicine

## 2015-12-14 DIAGNOSIS — M545 Low back pain, unspecified: Secondary | ICD-10-CM

## 2015-12-14 DIAGNOSIS — M8949 Other hypertrophic osteoarthropathy, multiple sites: Secondary | ICD-10-CM

## 2015-12-14 DIAGNOSIS — M159 Polyosteoarthritis, unspecified: Secondary | ICD-10-CM

## 2015-12-14 DIAGNOSIS — G8929 Other chronic pain: Secondary | ICD-10-CM

## 2015-12-14 DIAGNOSIS — M150 Primary generalized (osteo)arthritis: Principal | ICD-10-CM

## 2015-12-14 NOTE — Telephone Encounter (Signed)
No, I will not write an early refill.

## 2015-12-14 NOTE — Telephone Encounter (Signed)
Pt is requesting refill for HYDROcodone-acetaminophen (NORCO/VICODIN) 5-325 MG tablet [[id number]][[id number]]  She is aware it is a few days earlier and is wondering is she can go ahead and pick it up today. Please advise

## 2015-12-15 NOTE — Telephone Encounter (Signed)
Pt called back in. I informed her Dr. Yetta BarreJones will not fill till 12/21.  Please call pt when ready

## 2015-12-16 MED ORDER — HYDROCODONE-ACETAMINOPHEN 5-325 MG PO TABS: 1.0000 | ORAL_TABLET | Freq: Four times a day (QID) | ORAL | Status: DC | PRN

## 2015-12-16 NOTE — Telephone Encounter (Signed)
rx is printed for you to sign. Placed in your box.

## 2015-12-16 NOTE — Telephone Encounter (Signed)
Per PCP; pt needs to submit a UDS.

## 2015-12-16 NOTE — Telephone Encounter (Signed)
Pt call to check up on this request, Please give her a call ASAP once its done.

## 2015-12-29 ENCOUNTER — Other Ambulatory Visit: Payer: Self-pay | Admitting: Internal Medicine

## 2016-01-04 ENCOUNTER — Ambulatory Visit: Payer: Commercial Managed Care - HMO | Admitting: Internal Medicine

## 2016-01-05 ENCOUNTER — Other Ambulatory Visit: Payer: Self-pay | Admitting: Internal Medicine

## 2016-01-05 ENCOUNTER — Ambulatory Visit (INDEPENDENT_AMBULATORY_CARE_PROVIDER_SITE_OTHER): Payer: Commercial Managed Care - HMO | Admitting: Internal Medicine

## 2016-01-05 ENCOUNTER — Encounter: Payer: Self-pay | Admitting: Internal Medicine

## 2016-01-05 VITALS — BP 128/70 | HR 70 | Temp 97.5°F | Resp 16 | Ht 65.0 in | Wt 132.0 lb

## 2016-01-05 DIAGNOSIS — L89303 Pressure ulcer of unspecified buttock, stage 3: Secondary | ICD-10-CM | POA: Diagnosis not present

## 2016-01-05 DIAGNOSIS — L89309 Pressure ulcer of unspecified buttock, unspecified stage: Secondary | ICD-10-CM | POA: Insufficient documentation

## 2016-01-05 DIAGNOSIS — F418 Other specified anxiety disorders: Secondary | ICD-10-CM

## 2016-01-05 MED ORDER — BUPROPION HCL ER (XL) 150 MG PO TB24: 150.0000 mg | ORAL_TABLET | Freq: Every day | ORAL | Status: DC

## 2016-01-05 MED ORDER — LORAZEPAM 0.5 MG PO TABS: 0.5000 mg | ORAL_TABLET | Freq: Two times a day (BID) | ORAL | Status: DC

## 2016-01-05 NOTE — Progress Notes (Signed)
Subjective:  Patient ID: Sherri Weber, female    DOB: 1933/01/03  Age: 82 y.o. MRN: [id number]  CC: Depression   HPI Sherri Weber presents for concerns about depression - she complains of fatigue, anhedonia, sadness, and feels hopeless. She also complains of wounds over both upper thigh areas that have been worsening over the last few weeks.  Outpatient Prescriptions Prior to Visit  Medication Sig Dispense Refill  . calcium-vitamin D (OSCAL 500/200 D-3) 500-200 MG-UNIT tablet Take 1 tablet by mouth 2 (two) times daily. 180 tablet 3  . diphenoxylate-atropine (LOMOTIL) 2.5-0.025 MG per tablet Take 1 tablet by mouth 4 (four) times daily as needed for diarrhea or loose stools. 75 tablet 2  . DULoxetine (CYMBALTA) 60 MG capsule Take 1 capsule (60 mg total) by mouth daily. 30 capsule 5  . gabapentin (NEURONTIN) 100 MG capsule See titration instructions . Goal is 200mg  TID    . GAVILYTE-G 236 G solution     . iron polysaccharides (NIFEREX) 150 MG capsule Take 150 mg by mouth daily.    Marland Kitchen loperamide (IMODIUM) 2 MG capsule     . oxybutynin (DITROPAN) 5 MG tablet Take 1 tablet (5 mg total) by mouth 2 (two) times daily. 180 tablet 3  . saccharomyces boulardii (FLORASTOR) 250 MG capsule Take 1 capsule (250 mg total) by mouth 2 (two) times daily. 90 capsule 3  . Suvorexant (BELSOMRA) 15 MG TABS Take 1 tablet by mouth at bedtime as needed. 30 tablet 5  . LORazepam (ATIVAN) 0.5 MG tablet Take 1 tablet (0.5 mg total) by mouth every 8 (eight) hours as needed. 90 tablet 3   No facility-administered medications prior to visit.    ROS Review of Systems  Constitutional: Negative.  Negative for fever, chills, diaphoresis, appetite change and fatigue.  HENT: Negative.   Eyes: Negative.   Respiratory: Negative.  Negative for cough, choking, chest tightness, shortness of breath and stridor.   Cardiovascular: Negative.  Negative for chest pain, palpitations and leg swelling.  Gastrointestinal:  Negative.  Negative for vomiting, abdominal pain, diarrhea, constipation and blood in stool.  Endocrine: Negative.   Genitourinary: Negative.   Musculoskeletal: Negative.  Negative for myalgias, back pain, joint swelling, arthralgias and neck stiffness.  Skin: Positive for wound. Negative for color change, pallor and rash.  Allergic/Immunologic: Negative.   Neurological: Negative.  Negative for dizziness and weakness.  Hematological: Negative.  Negative for adenopathy. Does not bruise/bleed easily.  Psychiatric/Behavioral: Positive for sleep disturbance, dysphoric mood and decreased concentration. Negative for suicidal ideas, hallucinations, behavioral problems, confusion, self-injury and agitation. The patient is nervous/anxious. The patient is not hyperactive.     Objective:  BP 128/70 mmHg  Pulse 70  Temp(Src) 97.5 F (36.4 C) (Oral)  Resp 16  Ht 5\' 5"  (1.651 m)  Wt 132 lb (59.875 kg)  BMI 21.97 kg/m2  SpO2 96%  BP Readings from Last 3 Encounters:  01/05/16 128/70  11/30/15 138/90  10/13/15 118/72    Wt Readings from Last 3 Encounters:  01/05/16 132 lb (59.875 kg)  11/30/15 130 lb 8 oz (59.194 kg)  10/13/15 129 lb (58.514 kg)    Physical Exam  Musculoskeletal:       Legs: Psychiatric: Her behavior is normal. Judgment normal. Her mood appears anxious. Her affect is not angry, not blunt, not labile and not inappropriate. Her speech is not rapid and/or pressured, not delayed and not tangential. Cognition and memory are normal. She exhibits a depressed mood. She expresses no  homicidal and no suicidal ideation. She expresses no suicidal plans and no homicidal plans.    Lab Results  Component Value Date   WBC 5.6 09/16/2015   HGB 12.4 09/16/2015   HCT 36.1 09/16/2015   PLT 264.0 09/16/2015   GLUCOSE 89 07/24/2015   CHOL 145 04/30/2015   TRIG 276.0* 04/30/2015   HDL 23.40* 04/30/2015   LDLDIRECT 75.0 04/30/2015   LDLCALC 122* 02/24/2014   ALT 10 07/24/2015   AST 14  07/24/2015   NA 142 07/24/2015   K 4.9 07/24/2015   CL 107 07/24/2015   CREATININE 0.89 07/24/2015   BUN 12 07/24/2015   CO2 30 07/24/2015   TSH 2.71 09/16/2015   INR 0.95 04/28/2013    Dg Chest 2 View  05/08/2014  CLINICAL DATA:  Left-sided pain post fall EXAM: CHEST  2 VIEW COMPARISON:  04/28/2013. FINDINGS: Cardiomediastinal silhouette is stable. Bronchiectasis right infrahilar region again noted. No acute infiltrate or pulmonary edema. There is cortical step-off left sixth rib suspicious for fracture. Clinical correlation is necessary. No pneumothorax. Mild degenerative changes thoracic spine. IMPRESSION: Bronchiectasis right infrahilar region again noted. No acute infiltrate or pulmonary edema. There is cortical step-off left sixth rib suspicious for fracture. Clinical correlation is necessary. No pneumothorax. Mild degenerative changes thoracic spine. Electronically Signed   By: Natasha Mead M.D.   On: 05/08/2014 09:14    Assessment & Plan:   Sherri Weber was seen today for depression.  Diagnoses and all orders for this visit:  Depression with anxiety- I am concerned that the BZD may be contributing to the anxiety so I have asked her to start tapering it, will cont cymbalta and will add on wellbutrin -     LORazepam (ATIVAN) 0.5 MG tablet; Take 1 tablet (0.5 mg total) by mouth 2 (two) times daily. -     buPROPion (WELLBUTRIN XL) 150 MG 24 hr tablet; Take 1 tablet (150 mg total) by mouth daily.  Decubitus ulcer of buttock, unspecified laterality, stage III [facility])- will refer for wound care with [facility] and the wound clinic -     Ambulatory referral to Home Health -     Ambulatory referral to Wound Clinic   I have discontinued Sherri Weber's HYDROcodone-acetaminophen. I have also changed her LORazepam. Additionally, I am having her start on buPROPion. Lastly, I am having her maintain her iron polysaccharides, saccharomyces boulardii, oxybutynin, gabapentin, diphenoxylate-atropine, loperamide,  GAVILYTE-G, DULoxetine, Suvorexant, calcium-vitamin D, CALCIUM CITRATE + D3 MAXIMUM, and zolpidem.  Meds ordered this encounter  Medications  . CALCIUM CITRATE + D3 MAXIMUM 315-250 MG-UNIT TABS    Sig: Take 1 tablet by mouth 2 (two) times daily.    Refill:  3  . zolpidem (AMBIEN) 10 MG tablet    Sig: TAKE 1 TABLET DAILY AT BEDTIME AS NEEDED FOR SLEEP    Refill:  3  . DISCONTD: HYDROcodone-acetaminophen (NORCO/VICODIN) 5-325 MG tablet    Sig: TAKE 1 TABLET BY MOUTH EVERY 6 HOURS AS NEEDED FOR moderate PAIN    Refill:  0  . LORazepam (ATIVAN) 0.5 MG tablet    Sig: Take 1 tablet (0.5 mg total) by mouth 2 (two) times daily.    Dispense:  60 tablet    Refill:  2  . buPROPion (WELLBUTRIN XL) 150 MG 24 hr tablet    Sig: Take 1 tablet (150 mg total) by mouth daily.    Dispense:  90 tablet    Refill:  3     Follow-up: Return in about 3  months (around 04/04/2016).  Sanda Lingerhomas Jannifer Fischler, MD

## 2016-01-05 NOTE — Patient Instructions (Signed)
Major Depressive Disorder Major depressive disorder is a mental illness. It also may be called clinical depression or unipolar depression. Major depressive disorder usually causes feelings of sadness, hopelessness, or helplessness. Some people with this disorder do not feel particularly sad but lose interest in doing things they used to enjoy (anhedonia). Major depressive disorder also can cause physical symptoms. It can interfere with work, school, relationships, and other normal everyday activities. The disorder varies in severity but is longer lasting and more serious than the sadness we all feel from time to time in our lives. Major depressive disorder often is triggered by stressful life events or major life changes. Examples of these triggers include divorce, loss of your job or home, a move, and the death of a family member or close friend. Sometimes this disorder occurs for no obvious reason at all. People who have family members with major depressive disorder or bipolar disorder are at higher risk for developing this disorder, with or without life stressors. Major depressive disorder can occur at any age. It may occur just once in your life (single episode major depressive disorder). It may occur multiple times (recurrent major depressive disorder). SYMPTOMS People with major depressive disorder have either anhedonia or depressed mood on nearly a daily basis for at least 2 weeks or longer. Symptoms of depressed mood include:  Feelings of sadness (blue or down in the dumps) or emptiness.  Feelings of hopelessness or helplessness.  Tearfulness or episodes of crying (may be observed by others).  Irritability (children and adolescents). In addition to depressed mood or anhedonia or both, people with this disorder have at least four of the following symptoms:  Difficulty sleeping or sleeping too much.   Significant change (increase or decrease) in appetite or weight.   Lack of energy or  motivation.  Feelings of guilt and worthlessness.   Difficulty concentrating, remembering, or making decisions.  Unusually slow movement (psychomotor retardation) or restlessness (as observed by others).   Recurrent wishes for death, recurrent thoughts of self-harm (suicide), or a suicide attempt. People with major depressive disorder commonly have persistent negative thoughts about themselves, other people, and the world. People with severe major depressive disorder may experiencedistorted beliefs or perceptions about the world (psychotic delusions). They also may see or hear things that are not real (psychotic hallucinations). DIAGNOSIS Major depressive disorder is diagnosed through an assessment by your health care provider. Your health care provider will ask aboutaspects of your daily life, such as mood,sleep, and appetite, to see if you have the diagnostic symptoms of major depressive disorder. Your health care provider may ask about your medical history and use of alcohol or drugs, including prescription medicines. Your health care provider also may do a physical exam and blood work. This is because certain medical conditions and the use of certain substances can cause major depressive disorder-like symptoms (secondary depression). Your health care provider also may refer you to a mental health specialist for further evaluation and treatment. TREATMENT It is important to recognize the symptoms of major depressive disorder and seek treatment. The following treatments can be prescribed for this disorder:   Medicine. Antidepressant medicines usually are prescribed. Antidepressant medicines are thought to correct chemical imbalances in the brain that are commonly associated with major depressive disorder. Other types of medicine may be added if the symptoms do not respond to antidepressant medicines alone or if psychotic delusions or hallucinations occur.  Talk therapy. Talk therapy can be  helpful in treating major depressive disorder by providing   support, education, and guidance. Certain types of talk therapy also can help with negative thinking (cognitive behavioral therapy) and with relationship issues that trigger this disorder (interpersonal therapy). A mental health specialist can help determine which treatment is best for you. Most people with major depressive disorder do well with a combination of medicine and talk therapy. Treatments involving electrical stimulation of the brain can be used in situations with extremely severe symptoms or when medicine and talk therapy do not work over time. These treatments include electroconvulsive therapy, transcranial magnetic stimulation, and vagal nerve stimulation.   This information is not intended to replace advice given to you by your health care provider. Make sure you discuss any questions you have with your health care provider.   Document Released: 04/08/2013 Document Revised: 01/02/2015 Document Reviewed: 04/08/2013 Elsevier Interactive Patient Education 2016 Elsevier Inc.  

## 2016-01-05 NOTE — Progress Notes (Signed)
Pre visit review using our clinic review tool, if applicable. No additional management support is needed unless otherwise documented below in the visit note. 

## 2016-01-08 ENCOUNTER — Telehealth: Payer: Self-pay | Admitting: Internal Medicine

## 2016-01-08 NOTE — Telephone Encounter (Signed)
Dr Yetta BarreJones pt declined appt with wound care. Said home health is coming out and she doesn't want to go to wound care center. That it was too far and she doesn't drive.

## 2016-01-08 NOTE — Telephone Encounter (Signed)
[facility] - Client TELEPHONE ADVICE RECORD [facility] Patient Name: Sherri BaltimoreSHIRLEY Bartus DOB: 22-Apr-1933 Initial Comment Caller states, Karen KitchensBobbie w/ [facility] [phone] patient HR 43 bp 130/72 feels weak, she has 3 heart sounds, not two. it sounds like a gallop. Nurse Assessment Nurse: Roderic OvensNorth, RN, Amy Date/Time Lamount Cohen(Eastern Time): 01/08/2016 2:59:59 PM Confirm and document reason for call. If symptomatic, describe symptoms. You must click the next button to save text entered. ---HR IS 43. SHE HAS BEEN FEELING TIRED, NO ENERGY, LIGHT-HEADED. HEART RATE IS GALLOP. HOME HEALTH NURSE IS CALLING AND REPORTING THIS INFORMATION ON THIS PATIENT. SHE IS ASKING WHAT SHE SHOULD DO FOR HER. INSTRUCTED HER TO SEND HER INTO THE ED. Has the patient traveled out of the country within the last 30 days? ---Not Applicable Does the patient have any new or worsening symptoms? ---Yes Will a triage be completed? ---Yes Related visit to physician within the last 2 weeks? ---Yes Does the PT have any chronic conditions? (i.e. diabetes, asthma, etc.) ---Yes List chronic conditions. ---PLEASE SEE EPIC Is this a behavioral health or substance abuse call? ---No Guidelines Guideline Title Affirmed Question Affirmed Notes Heart Rate and Heartbeat Questions Dizziness, lightheadedness, or weakness Final Disposition User Go to ED Now [place]th, RN, Amy Comments charge nurse says URG Referrals GO TO FACILITY UNDECIDED Disagree/Comply: Comply

## 2016-01-12 ENCOUNTER — Encounter: Payer: Self-pay | Admitting: Internal Medicine

## 2016-01-18 ENCOUNTER — Telehealth: Payer: Self-pay

## 2016-01-18 DIAGNOSIS — L89303 Pressure ulcer of unspecified buttock, stage 3: Secondary | ICD-10-CM

## 2016-01-18 NOTE — Telephone Encounter (Signed)
Home health requesting DME order for cushion for pressure sore/ulcer will fax to [facility]. Marland Kitchen

## 2016-01-19 ENCOUNTER — Telehealth: Payer: Self-pay | Admitting: Internal Medicine

## 2016-01-19 DIAGNOSIS — N3281 Overactive bladder: Secondary | ICD-10-CM

## 2016-01-19 MED ORDER — OXYBUTYNIN CHLORIDE 5 MG PO TABS: 5.0000 mg | ORAL_TABLET | Freq: Two times a day (BID) | ORAL | Status: DC

## 2016-01-19 NOTE — Telephone Encounter (Signed)
Pt went to refill her oxybutynin (DITROPAN) 5 MG tablet [[id number] and the price of them for a 3 mos supply has gone up to over $125 and she is wondering if she can get monthly prescriptions so she can afford them.  She is out of medication. She is aware Dr. Yetta Barre is out for the week. Not sure if another provider can do anything.

## 2016-01-22 ENCOUNTER — Other Ambulatory Visit: Payer: Self-pay | Admitting: Internal Medicine

## 2016-01-22 ENCOUNTER — Telehealth: Payer: Self-pay | Admitting: Internal Medicine

## 2016-01-22 DIAGNOSIS — L89303 Pressure ulcer of unspecified buttock, stage 3: Secondary | ICD-10-CM

## 2016-01-22 NOTE — Telephone Encounter (Signed)
Reita Cliche, nurse from [facility], need to clarify wound order that we send in. Please give her a call  Phone # [phone]

## 2016-01-22 NOTE — Telephone Encounter (Signed)
Returned call. Needed order for cuushion refaxed and informed Mrs. Herda has an appt on 1/31

## 2016-01-26 ENCOUNTER — Ambulatory Visit: Payer: Commercial Managed Care - HMO | Admitting: Internal Medicine

## 2016-01-26 ENCOUNTER — Telehealth: Payer: Self-pay | Admitting: Internal Medicine

## 2016-01-26 ENCOUNTER — Encounter: Payer: Self-pay | Admitting: *Deleted

## 2016-01-26 NOTE — Telephone Encounter (Signed)
Patient is requesting script for hydrocodone.   °

## 2016-01-26 NOTE — Telephone Encounter (Signed)
Verbal from PCP - PCP will not longer rx hydrocodone due to negative UDS result.   Contacted pt and informed pt of the same and why. Pt stated understanding.

## 2016-01-27 ENCOUNTER — Ambulatory Visit: Payer: Self-pay

## 2016-01-29 ENCOUNTER — Other Ambulatory Visit: Payer: Self-pay

## 2016-01-29 VITALS — BP 107/96 | Ht 65.0 in | Wt 132.0 lb

## 2016-01-29 DIAGNOSIS — I10 Essential (primary) hypertension: Secondary | ICD-10-CM

## 2016-01-29 NOTE — Patient Outreach (Signed)
[facility]) Care Management  01/29/2016  Sherri Weber 1933-07-02 [id number]   Care Coordination Services:  Contact call to [facility] provider to verify agency providing [facility] services.   [facility]  [address] [place],  [address]  [phone] Nurse Jarold Motto, RN, BSN, [facility], CCM  [facility] Management Care Management Coordinator [phone] Direct [phone] Cell [phone] Office [phone] Fax

## 2016-01-29 NOTE — Patient Outreach (Signed)
[facility]) Care Management  01/29/2016  Sherri Weber May 10, 1933 [id number]  Referral Date:  01/25/2016 Source:  [facility] Issue:  Recent Hospitalization.  Patient is having medication affordability issues and states she is also having issues with financial resources.  Patient recently had pacemaker placement.  HTN, Decubitus Ulcer.  Please also assess for RN Involvement.   Providers: Primary MD: Dr. [name] -  last appt:  01/05/12  next appt:  02/04/16 but wants to move up due to patient has not been feeling well and has elevated BP. Cardiologist:  Dr. Eliane Decree [facility]) HH:  SN visits every other day.  Plan of care includes  Vitals, medication management, Decubitus (2) on  buttock  Patient is not able to identify the name of [facility] provider but is able to provide contact # and nurse name.  Nurse Karen Kitchens [phone] Insurance: [facility].   Social: Lives in her home alone.  Daughter lives in [place].  Daughter coming tomorrow 01/30/2016 for about a one month visit.  States she may go back with patient for a while if she is doing ok.  Daughter has CNA experience.  Mobility: Ambulates with walker due to having dizziness (thinks may be related to cardiac medication side effect; but patient is not sure).  Falls:  Patient fall risk due to dizziness.   Pain: yes  Transportation:  Fredrik [place] [phone] Caregiver: None  Emergency Contact:  [place]rik [place] [phone]  Advance Directive: None.  RN CM provided education and patient agreed to mailing of document for review.  Resources: None DME: walker, shower chair with a back, BP cuff (none), Scales (none)-friend is planning to bring patient a set of scales, eyeglasses/readers only, upper dentures, Colostomy supplies (2013), wound supplies, chair cushion needed:  Home Health Nurse is trying to get patient a cushion to sit on to help relieve pressure and promote healing but has not received.   Patient thinks nurse is having issues getting insurance approval. .  [name] conditions:  Low heart rate 42 prior to Pacemker, HTN Admissions: (1)  01/15/16  [facility] - 7 day admission  for pace maker ER visits: (1) Kathryne Sharper Patient states she has not felt well since discharging home 01/15/16.  States her BP has been elevated since pacemaker insertion.  New BP medication initiated 01/27/2016 following patient calling MD office 01/28/2016 and reporting elevated HH SN BP check 172/100.  MD consulted and ordered BP medication:   losartan 50 mg every day.   Home BP 107/96 on HH SN reading today. Next HH BP check 02/01/2016.  Patient does not own a BP cuff.  Weight 132 lbs.   Decubitus (2) lower buttock Thinks started from sitting too long.  HH SN every other day for dressing changes.   Medications:  Patient taking less than 10 medications.  Co-pay cost issue:  None  Flu Vaccine: yes  Pneumonia Vaccine: 2016 Medication questions regarding side effect dizziness.   Consent: Patient agreed to [facility] services.   Plan:  Referral Date:  01/25/16 CM Referral Date:  01/25/16  [facility] RN CM Referral  -[facility] Referral -Transition of Care Referral - admission within the past 30 days.  -HTN - hypertensive - see Screening 01/29/2016 for further details.   [facility] SW Referral -Advance Directives (document mailed to patient on 01/29/2016)  RN CM Care Coordination: -RN CM will contact [facility] provider for agency verification and home health updates.    [facility] Referral  -  Please review medications with patient and discuss possible medication side effects.  Patient  thinks dizziness related to a cardiac medication; however, BP readings have been elevated since pace maker placement 12/2015.  RN CM providing HTN education.      RN CM notified [facility] Care Management Assistant: agreed to services/case opened. RN CM sent successful outreach letter and  [facility] Introductory package. RN CM scheduled next  contact call within the next 30 days for Initial Assessment and care coordination services.  RN CM advised to please notify MD of any changes in condition prior to scheduled appt's.   RN CM provided contact name and # [phone] or main office # [phone] and 24-hour nurse line # [phone].  RN CM confirmed patient is aware of 911 services for urgent emergency needs.  Donato Schultz, RN, BSN, [facility], CCM  [facility] Management Coordinator [phone] Direct [phone] Cell [phone] Office [phone] Fax

## 2016-02-01 ENCOUNTER — Other Ambulatory Visit: Payer: Self-pay

## 2016-02-01 NOTE — Patient Outreach (Signed)
I called the patient to discuss her medications which could possibly be making her dizzy.  I need to verify her medication list first.  I had to leave a HIPPA compliant message for her to call me back.  This was my first attempt.  I will try again later this week.   Steve Rattler, PharmD, [employer] [facility] Environmental consultant [phone]

## 2016-02-02 ENCOUNTER — Other Ambulatory Visit: Payer: Self-pay | Admitting: Internal Medicine

## 2016-02-02 NOTE — Telephone Encounter (Signed)
Faxed script back to [place].../lmb 

## 2016-02-03 ENCOUNTER — Other Ambulatory Visit: Payer: Self-pay | Admitting: *Deleted

## 2016-02-03 ENCOUNTER — Ambulatory Visit: Payer: Self-pay

## 2016-02-03 NOTE — Patient Outreach (Signed)
[facility]) Care Management  02/03/2016  Sherri Weber 1933/08/24 [id number]   CSW received a new referral on patient from the [facility], indicating that patient is in need of "financial assistance".  Mariann Laster, Telephonic Nurse Case Manager with [facility], was able to make contact with patient on January 29, 2016 to perform the initial phone assessment, at which time, patient reported that she does not have any financial concerns, at present.  Mrs. Hutchinson reported, "Patient did not admit to any financial issues or medication cost issues".  However, Mrs. Hutchinson was concerned that patient may not be able to identify her needs well enough to report them to Mrs. Hutchinson.  Mrs. Hutchinson was able to order patient an RNCM, Raina Mina, also with [place] [facility], for disease management services, as well as transition of care calls.  Patient is already linked with home health nursing services for wound care and dressing changes.    CSW was able to make initial contact with patient today to perform a brief phone conversation, as well as assess and assist with social work needs and services.  CSW introduced self, explained role and types of services provided through [facility] ([facility]).  CSW further explained to patient that CSW works with Mrs. Hutchinson and that Mrs. Hutchinson thought that patient would benefit from social work services and resources to assist with [place] (initiation of Living Will and Reader documents).  CSW obtained two HIPAA compliant identifiers from patient, which included patient's name and date of birth.  Patient admitted that she received the Advanced Directives packet mailed to her home by Mrs. Hutchinson last week.  Patient did not wish to receive assistance with completion of the documents, as  patient reported that her daughter, Melynda Ripple currently lives in [place] and that she would like to discuss her wishes with Mrs. Buford Dresser, prior to putting anything in writing.  CSW voiced understanding.   Mrs. Hutchinson reported that more social work needs may arise when Ms. Zigmund Daniel becomes involved with patient's care; however, patient denied having any additional social work needs at present.  CSW provided patient with CSW's contact information, encouraging patient to contact CSW directly if things change or patient decides that she is interested in social work services and/or resources.  Patient was agreeable to this plan. CSW will perform a case closure on patient, as all goals of treatment have been met from social work standpoint and no additional social work needs have been identified at this time. CSW will notify patient's RNCM with [place] [facility], Raina Mina of CSW's plans to close patient's case. CSW will fax a correspondence letter to patient's Primary Care Physician, Dr. Scarlette Calico to ensure that Dr. Ronnald Ramp is aware of CSW's involvement with patient. CSW will submit a case closure request to Lurline Del, Care Management Assistant with [facility], in the form of an [employer].  CSW will ensure that Mrs. Laurance Flatten is aware of Raina Mina, RNCM with [facility], continued involvement with patient's care.  Nat Christen, BSW, MSW, LCSW  Licensed Education officer, environmental Health System  Mailing [place] [address], [place], [place] [address] Physical Address-[address], [place], [place] [address] Toll Free Main # [phone] Fax # [phone] Cell # [phone]  Fax # [phone]  [email or web address]  [place] complies with [employer] civil rights laws and does not discriminate on the basis of race,  color, national origin, age, disability, or sex.  Espaol (Spanish)  [place] cumple con las leyes federales de derechos civiles aplicables y no discrimina por motivos de raza, color, nacionalidad, edad, discapacidad o sexo.     Ti?ng Vi?t ([place])  [place] tun th? lu?t dn quy?n hi?n hnh c?a Lin bang v khng phn bi?t ?i x? d?a trn ch?ng t?c, mu da, ngu?n g?c qu?c gia, ? tu?i, khuy?t t?t, ho?c gi?i tnh.     (Arabic)    [place] is Against the [employer]. and its subsidiaries comply with [employer] civil rights laws and do not discriminate on the basis of race, color, national origin, age, disability, or sex. [place] do not exclude people or treat them differently because of race, color, national origin, age, disability, or sex.    [employer]. and its subsidiaries provide:  . Free auxiliary aids and services, such as qualified sign language interpreters, video remote interpretation, and written information in other formats to people with disabilities when such auxiliary aids and services are necessary to ensure an equal opportunity to participate. . Free language services to people whose primary language is not English when those services are necessary to provide meaningful access, such as translated documents or oral interpretation.    If you need these services, call [phone] or if you use a TTY, call 711.   If you believe that [employer]. and its subsidiaries have failed to provide these services or discriminated in another way on the basis of race, color, national origin, age, disability, or sex, you can file a Tourist information centre manager with:   Discrimination [place] [address]   If you need help filing a  grievance, call [phone] or if you use a TTY, call 711.  You can also file a civil rights complaint with the U.S. Department of Health and Financial controller, Office for Civil Rights electronically through the Office for Civil Rights Complaint Portal, available at OnSiteLending.nl.jsf, or by mail or phone at:   [place]. Department of Health and Human Services  [place], [place], [facility]  [place], [place]  [phone], [phone] (TDD)  Complaint forms are available at CutFunds.si [place]: ATTENTION: If you do not speak English, language assistance services, free of charge, are available to you. Call [phone] (TTY: 891).  Espaol (Spanish): ATENCIN: si habla espaol, tiene a su disposicin servicios gratuitos de asistencia lingstica. Llame al [phone] (TTY: 003). ???? (Chinese): ?????????????????????????????? [phone]?TTY: 711??  Ti?ng Vi?t (Vietnamese): CH : N?u b?n ni Ti?ng Vi?t, c cc d?ch v? h? tr? ngn ng? mi?n ph dnh cho b?n. G?i s? [phone] (TTY: 482).  ??? ([place]): ?? : ???? ????? ?? , ?? ?? ???? ??? ???? ? ???? . [phone] (TTY: 711)??? ??? ???? .  Tagalog (Tagalog - Filipino): PAUNAWA: Kung nagsasalita ka ng Tagalog, maaari kang gumamit ng mga serbisyo ng tulong sa wika nang walang bayad. [place] sa [phone] (TTY: 883).   [place]): :      ,      .  [phone] (: 309).  Kreyl Ayisyen ([place]): ATANSYON: Si w pale Sherilyn Cooter  Ayisyen, gen svis d pou lang ki disponib gratis pou ou. Rele [phone] (TTY: 386).  Fonnie Jarvis [place] Kitchen[place]): ATTENTION : Si vous parlez franais, des services d'aide linguistique vous sont proposs gratuitement. Appelez le [phone] (ATS : 973).  Polski (Polish): UWAGA:  Jeeli mwisz po polsku, moesz skorzysta z bezpatnej pomocy jzykowej. Zadzwo pod numer [phone] (TTY: 129).  Portugus ([place]): ATENO: Se fala portugus, encontram-se disponveis servios lingusticos, grtis. Ligue para [phone] (TTY: 178).  Italiano ([place]): ATTENZIONE: In caso la lingua parlata sia l'italiano, sono disponibili servizi di assistenza linguistica gratuiti. Chiamare il numero [phone] (TTY: 172).  Dawayne Patricia ([place]): ACHTUNG: Wenn Sie Deutsch sprechen,  stehen Ihnen kostenlos sprachliche Hilfsdienstleistungen zur [employer]. Rufnummer: [phone] (TTY: 694).   (Arabic): [phone]   .            : .)242 :   (  ??? ([place]): ??????????????????????????????????[phone] ?TTY?711?????????????????  ? (Farsi): [phone]  . ?   ? ?  ? ? ?~ ?  ?    : .??  (TTY: 711)  Din Bizaad ([place]): D77 baa ak0 n7n7zin: D77 saad bee y1n7[ti'go Risa Grill, saad bee 1k1'[id number]'1wo'd66', t'11 Pricilla Loveless n1 h0l=, koj8' h0d77lnih [phone] (TTY:   123).

## 2016-02-04 ENCOUNTER — Encounter: Payer: Self-pay | Admitting: Internal Medicine

## 2016-02-04 ENCOUNTER — Other Ambulatory Visit: Payer: Self-pay | Admitting: Internal Medicine

## 2016-02-04 ENCOUNTER — Ambulatory Visit (INDEPENDENT_AMBULATORY_CARE_PROVIDER_SITE_OTHER): Payer: Commercial Managed Care - HMO | Admitting: Internal Medicine

## 2016-02-04 ENCOUNTER — Ambulatory Visit: Payer: Commercial Managed Care - HMO | Admitting: Internal Medicine

## 2016-02-04 ENCOUNTER — Other Ambulatory Visit: Payer: Self-pay | Admitting: *Deleted

## 2016-02-04 ENCOUNTER — Other Ambulatory Visit (INDEPENDENT_AMBULATORY_CARE_PROVIDER_SITE_OTHER): Payer: Commercial Managed Care - HMO

## 2016-02-04 VITALS — BP 104/60 | HR 95 | Temp 98.1°F | Resp 16 | Wt 125.2 lb

## 2016-02-04 DIAGNOSIS — L89301 Pressure ulcer of unspecified buttock, stage 1: Secondary | ICD-10-CM

## 2016-02-04 DIAGNOSIS — M8949 Other hypertrophic osteoarthropathy, multiple sites: Secondary | ICD-10-CM

## 2016-02-04 DIAGNOSIS — M545 Low back pain, unspecified: Secondary | ICD-10-CM

## 2016-02-04 DIAGNOSIS — E781 Pure hyperglyceridemia: Secondary | ICD-10-CM

## 2016-02-04 DIAGNOSIS — M150 Primary generalized (osteo)arthritis: Secondary | ICD-10-CM | POA: Diagnosis not present

## 2016-02-04 DIAGNOSIS — Z23 Encounter for immunization: Secondary | ICD-10-CM | POA: Diagnosis not present

## 2016-02-04 DIAGNOSIS — M159 Polyosteoarthritis, unspecified: Secondary | ICD-10-CM

## 2016-02-04 DIAGNOSIS — I10 Essential (primary) hypertension: Secondary | ICD-10-CM

## 2016-02-04 DIAGNOSIS — S32810D Multiple fractures of pelvis with stable disruption of pelvic ring, subsequent encounter for fracture with routine healing: Secondary | ICD-10-CM

## 2016-02-04 DIAGNOSIS — G8929 Other chronic pain: Secondary | ICD-10-CM

## 2016-02-04 DIAGNOSIS — S32001A Stable burst fracture of unspecified lumbar vertebra, initial encounter for closed fracture: Secondary | ICD-10-CM

## 2016-02-04 LAB — CBC WITH DIFFERENTIAL/PLATELET
Basophils Absolute: 0.1 10*3/uL (ref 0.0–0.1)
Basophils Relative: 0.9 % (ref 0.0–3.0)
Eosinophils Absolute: 0.2 10*3/uL (ref 0.0–0.7)
Eosinophils Relative: 3.3 % (ref 0.0–5.0)
HCT: 41.6 % (ref 36.0–46.0)
Hemoglobin: 14.1 g/dL (ref 12.0–15.0)
Lymphocytes Relative: 22.6 % (ref 12.0–46.0)
Lymphs Abs: 1.5 10*3/uL (ref 0.7–4.0)
MCHC: 33.9 g/dL (ref 30.0–36.0)
MCV: 89.6 fl (ref 78.0–100.0)
Monocytes Absolute: 0.5 10*3/uL (ref 0.1–1.0)
Monocytes Relative: 7.7 % (ref 3.0–12.0)
Neutro Abs: 4.3 10*3/uL (ref 1.4–7.7)
Neutrophils Relative %: 65.5 % (ref 43.0–77.0)
Platelets: 270 10*3/uL (ref 150.0–400.0)
RBC: 4.65 Mil/uL (ref 3.87–5.11)
RDW: 15.1 % (ref 11.5–15.5)
WBC: 6.5 10*3/uL (ref 4.0–10.5)

## 2016-02-04 LAB — BASIC METABOLIC PANEL
BUN: 26 mg/dL — ABNORMAL HIGH (ref 6–23)
CO2: 29 mEq/L (ref 19–32)
Calcium: 9.5 mg/dL (ref 8.4–10.5)
Chloride: 101 mEq/L (ref 96–112)
Creatinine, Ser: 1.13 mg/dL (ref 0.40–1.20)
GFR: 49.15 mL/min — ABNORMAL LOW (ref >60.00)
Glucose, Bld: 100 mg/dL — ABNORMAL HIGH (ref 70–99)
Potassium: 4.7 mEq/L (ref 3.5–5.1)
Sodium: 136 mEq/L (ref 135–145)

## 2016-02-04 LAB — LIPID PANEL
Cholesterol: 191 mg/dL (ref 0–200)
HDL: 29.9 mg/dL — ABNORMAL LOW (ref >39.00)
Total CHOL/HDL Ratio: 6
Triglycerides: 439 mg/dL — ABNORMAL HIGH (ref 0.0–149.0)

## 2016-02-04 LAB — TSH: TSH: 4.69 u[IU]/mL — ABNORMAL HIGH (ref 0.35–4.50)

## 2016-02-04 LAB — LDL CHOLESTEROL, DIRECT: Direct LDL: 72 mg/dL

## 2016-02-04 MED ORDER — FENTANYL 12 MCG/HR TD PT72: 12.5000 ug | MEDICATED_PATCH | TRANSDERMAL | Status: DC

## 2016-02-04 NOTE — Patient Outreach (Signed)
[facility]) Care Management  02/04/2016  BREAUNNA GOTTLIEB 1933/09/25 [id number]   RN spoke with pt's daughter who indicated pt just returned home from her cardiologist who has stopped several of her BP medication due to her low readings. RN explained the purpose of today's call and [facility] services concerning the recent referral to address pt's BP.  Daughter very interested but states pt is very tired and does not feel like talking to RN at this time. RN inquired on a call back at a later date or time. Daughter very receptive and pt agreed to a late morning call. RN will follow up in the AM as agreed.  Daughter also states [facility] will be services this pt with home visits and reminded RN that pt has a pacemaker. RN did verify pt does not have a BP device for ongoing monitoring. Will further discuss this tomorrow and attempt to set up a home visit. [name], RN Care Management Coordinator [facility] Main Office [phone]

## 2016-02-04 NOTE — Patient Instructions (Signed)

## 2016-02-04 NOTE — Progress Notes (Signed)
Subjective:  Patient ID: Sherri Weber, female    DOB: 1933/08/11  Age: 82 y.o. MRN: [id number]  CC: Hypertension; Hyperlipidemia; Osteoarthritis; and Back Pain   HPI Sherri Weber presents for follow-up, since I last saw her she had an episode of syncope and was seen at another hospital and was found to have complete heart block and now has a pacemaker in. She has had no more episodes of syncope but she does complain of lightheadedness, dizziness and generalized weakness. Her cardiologist has been escalating her dose of atenolol. She also complains of back pain and joint pain and she has decubitus ulcers over her sacrum. When I last saw her she had a urine drug screen that was negative for hydrocodone at the time that I was prescribing Norco for her. I was concerned that there was some diversion or misuse of the hydrocodone so I canceled that prescription. She requests something else be given for pain control.  Outpatient Prescriptions Prior to Visit  Medication Sig Dispense Refill  . buPROPion (WELLBUTRIN XL) 150 MG 24 hr tablet Take 1 tablet (150 mg total) by mouth daily. 90 tablet 3  . calcium-vitamin D (OSCAL 500/200 D-3) 500-200 MG-UNIT tablet Take 1 tablet by mouth 2 (two) times daily. 180 tablet 3  . LORazepam (ATIVAN) 0.5 MG tablet Take 1 tablet (0.5 mg total) by mouth 2 (two) times daily. 60 tablet 2  . oxybutynin (DITROPAN) 5 MG tablet Take 1 tablet (5 mg total) by mouth 2 (two) times daily. 60 tablet 1  . zolpidem (AMBIEN) 10 MG tablet TAKE 1 TABLET DAILY AT BEDTIME AS NEEDED FOR SLEEP 30 tablet 2  . CALCIUM CITRATE + D3 MAXIMUM 315-250 MG-UNIT TABS Take 1 tablet by mouth 2 (two) times daily.  3  . gabapentin (NEURONTIN) 100 MG capsule See titration instructions . Goal is 200mg  TID    . GAVILYTE-G 236 G solution Reported on 02/04/2016    . loperamide (IMODIUM) 2 MG capsule Reported on 02/04/2016    . diphenoxylate-atropine (LOMOTIL) 2.5-0.025 MG per tablet Take 1 tablet by mouth  4 (four) times daily as needed for diarrhea or loose stools. 75 tablet 2  . DULoxetine (CYMBALTA) 60 MG capsule Take 1 capsule (60 mg total) by mouth daily. 30 capsule 5  . iron polysaccharides (NIFEREX) 150 MG capsule Take 150 mg by mouth daily.    Marland Kitchen saccharomyces boulardii (FLORASTOR) 250 MG capsule Take 1 capsule (250 mg total) by mouth 2 (two) times daily. 90 capsule 3  . Suvorexant (BELSOMRA) 15 MG TABS Take 1 tablet by mouth at bedtime as needed. 30 tablet 5   No facility-administered medications prior to visit.    ROS Review of Systems  Constitutional: Positive for fatigue. Negative for fever, chills, diaphoresis, appetite change and unexpected weight change.  HENT: Negative.  Negative for sore throat and trouble swallowing.   Eyes: Negative.   Respiratory: Negative.  Negative for cough, choking, chest tightness, shortness of breath and stridor.   Cardiovascular: Negative.  Negative for chest pain, palpitations and leg swelling.  Gastrointestinal: Negative.  Negative for nausea, vomiting, abdominal pain, diarrhea, constipation and blood in stool.  Endocrine: Negative.   Genitourinary: Negative.   Musculoskeletal: Positive for back pain and arthralgias. Negative for myalgias, joint swelling, gait problem, neck pain and neck stiffness.  Skin: Positive for wound. Negative for color change, pallor and rash.  Allergic/Immunologic: Negative.   Neurological: Positive for dizziness and weakness.  Hematological: Negative.  Negative for adenopathy. Does  not bruise/bleed easily.  Psychiatric/Behavioral: Positive for sleep disturbance and dysphoric mood. Negative for suicidal ideas, hallucinations, behavioral problems, confusion, self-injury and decreased concentration. The patient is nervous/anxious. The patient is not hyperactive.     Objective:  BP 104/60 mmHg  Pulse 95  Temp(Src) 98.1 F (36.7 C) (Oral)  Resp 16  Wt 125 lb 4 oz (56.813 kg)  SpO2 95%  BP Readings from Last 3  Encounters:  02/04/16 104/60  01/29/16 107/96  01/05/16 128/70    Wt Readings from Last 3 Encounters:  02/04/16 125 lb 4 oz (56.813 kg)  01/29/16 132 lb (59.875 kg)  01/05/16 132 lb (59.875 kg)    Physical Exam  Constitutional: She is oriented to person, place, and time. No distress.  HENT:  Mouth/Throat: Oropharynx is clear and moist. No oropharyngeal exudate.  Eyes: Conjunctivae are normal. Right eye exhibits no discharge. Left eye exhibits no discharge. No scleral icterus.  Neck: Normal range of motion. Neck supple. No JVD present. No tracheal deviation present. No thyromegaly present.  Cardiovascular: Normal rate, regular rhythm, normal heart sounds and intact distal pulses.  Exam reveals no gallop and no friction rub.   No murmur heard. Pulmonary/Chest: Effort normal and breath sounds normal. No stridor. No respiratory distress. She has no wheezes. She has no rales. She exhibits no tenderness.  Abdominal: Soft. Bowel sounds are normal. She exhibits no distension and no mass. There is no tenderness. There is no rebound and no guarding.  Musculoskeletal: Normal range of motion. She exhibits no edema or tenderness.  Lymphadenopathy:    She has no cervical adenopathy.  Neurological: She is oriented to person, place, and time.  Skin: Skin is warm and dry. No rash noted. She is not diaphoretic. No erythema. No pallor.     Psychiatric: Her speech is normal and behavior is normal. Judgment and thought content normal. Her mood appears anxious. Her affect is not angry. Cognition and memory are normal. She does not exhibit a depressed mood.  Vitals reviewed.   Lab Results  Component Value Date   WBC 6.5 02/04/2016   HGB 14.1 02/04/2016   HCT 41.6 02/04/2016   PLT 270.0 02/04/2016   GLUCOSE 100* 02/04/2016   CHOL 191 02/04/2016   TRIG * 02/04/2016    439.0 Triglyceride is over 400; calculations on Lipids are invalid.   HDL 29.90* 02/04/2016   LDLDIRECT 72.0 02/04/2016   LDLCALC  122* 02/24/2014   ALT 10 07/24/2015   AST 14 07/24/2015   NA 136 02/04/2016   K 4.7 02/04/2016   CL 101 02/04/2016   CREATININE 1.13 02/04/2016   BUN 26* 02/04/2016   CO2 29 02/04/2016   TSH 4.69* 02/04/2016   INR 0.95 04/28/2013    Dg Chest 2 View  05/08/2014  CLINICAL DATA:  Left-sided pain post fall EXAM: CHEST  2 VIEW COMPARISON:  04/28/2013. FINDINGS: Cardiomediastinal silhouette is stable. Bronchiectasis right infrahilar region again noted. No acute infiltrate or pulmonary edema. There is cortical step-off left sixth rib suspicious for fracture. Clinical correlation is necessary. No pneumothorax. Mild degenerative changes thoracic spine. IMPRESSION: Bronchiectasis right infrahilar region again noted. No acute infiltrate or pulmonary edema. There is cortical step-off left sixth rib suspicious for fracture. Clinical correlation is necessary. No pneumothorax. Mild degenerative changes thoracic spine. Electronically Signed   By: Natasha Mead M.D.   On: 05/08/2014 09:14    Assessment & Plan:   Liana was seen today for hypertension, hyperlipidemia, osteoarthritis and back pain.  Diagnoses and  all orders for this visit:  Hypertriglyceridemia- her triglyceride levels today taken in a nonfasting state are elevated, I will start and omega-3 fish oil supplement for this. -     TSH; Future -     Lipid panel; Future  Essential hypertension- she is symptomatically hypotensive today and her BUN is slightly elevated so I've asked her to stop taking the diuretic and the ARB, she will discuss the beta blocker dose with her cardiologist, . -     TSH; Future -     CBC with Differential/Platelet; Future -     Basic metabolic panel; Future  Chronic low back pain without sciatica, unspecified back pain laterality -     Discontinue: fentaNYL (DURAGESIC - DOSED MCG/HR) 12 MCG/HR; Place 1 patch (12.5 mcg total) onto the skin every 3 (three) days. -     Discontinue: fentaNYL (DURAGESIC - DOSED MCG/HR)  12 MCG/HR; Place 1 patch (12.5 mcg total) onto the skin every 3 (three) days. -     fentaNYL (DURAGESIC - DOSED MCG/HR) 12 MCG/HR; Place 1 patch (12.5 mcg total) onto the skin every 3 (three) days.  Primary osteoarthritis involving multiple joints -     Discontinue: fentaNYL (DURAGESIC - DOSED MCG/HR) 12 MCG/HR; Place 1 patch (12.5 mcg total) onto the skin every 3 (three) days. -     Discontinue: fentaNYL (DURAGESIC - DOSED MCG/HR) 12 MCG/HR; Place 1 patch (12.5 mcg total) onto the skin every 3 (three) days. -     fentaNYL (DURAGESIC - DOSED MCG/HR) 12 MCG/HR; Place 1 patch (12.5 mcg total) onto the skin every 3 (three) days.  Multiple pelvic fractures, with routine healing, subsequent encounter -     Discontinue: fentaNYL (DURAGESIC - DOSED MCG/HR) 12 MCG/HR; Place 1 patch (12.5 mcg total) onto the skin every 3 (three) days. -     Discontinue: fentaNYL (DURAGESIC - DOSED MCG/HR) 12 MCG/HR; Place 1 patch (12.5 mcg total) onto the skin every 3 (three) days. -     fentaNYL (DURAGESIC - DOSED MCG/HR) 12 MCG/HR; Place 1 patch (12.5 mcg total) onto the skin every 3 (three) days.  Closed stable burst fracture of lumbar vertebra, unspecified lumbar vertebral level, initial encounter (HCC) -     Discontinue: fentaNYL (DURAGESIC - DOSED MCG/HR) 12 MCG/HR; Place 1 patch (12.5 mcg total) onto the skin every 3 (three) days. -     Discontinue: fentaNYL (DURAGESIC - DOSED MCG/HR) 12 MCG/HR; Place 1 patch (12.5 mcg total) onto the skin every 3 (three) days. -     fentaNYL (DURAGESIC - DOSED MCG/HR) 12 MCG/HR; Place 1 patch (12.5 mcg total) onto the skin every 3 (three) days.  Need for prophylactic vaccination against Streptococcus pneumoniae (pneumococcus) -     Pneumococcal polysaccharide vaccine 23-valent greater than or equal to 2yo subcutaneous/IM  I have discontinued Ms. Bergsma's iron polysaccharides, saccharomyces boulardii, diphenoxylate-atropine, DULoxetine, Suvorexant, CALCIUM CITRATE + D3 MAXIMUM,  hydrochlorothiazide, and losartan. I am also having her maintain her gabapentin, loperamide, GAVILYTE-G, calcium-vitamin D, LORazepam, buPROPion, oxybutynin, zolpidem, atenolol, and fentaNYL.  Meds ordered this encounter  Medications  . atenolol (TENORMIN) 100 MG tablet    Sig: Take by mouth.  . DISCONTD: hydrochlorothiazide (HYDRODIURIL) 25 MG tablet    Sig: Take by mouth.  . DISCONTD: losartan (COZAAR) 50 MG tablet    Sig: Take by mouth.  . DISCONTD: fentaNYL (DURAGESIC - DOSED MCG/HR) 12 MCG/HR    Sig: Place 1 patch (12.5 mcg total) onto the skin every 3 (three) days.  Dispense:  10 patch    Refill:  0  . DISCONTD: fentaNYL (DURAGESIC - DOSED MCG/HR) 12 MCG/HR    Sig: Place 1 patch (12.5 mcg total) onto the skin every 3 (three) days.    Dispense:  10 patch    Refill:  0  . fentaNYL (DURAGESIC - DOSED MCG/HR) 12 MCG/HR    Sig: Place 1 patch (12.5 mcg total) onto the skin every 3 (three) days.    Dispense:  10 patch    Refill:  0     Follow-up: Return in about 3 months (around 05/03/2016).  Sanda Linger, MD

## 2016-02-04 NOTE — Telephone Encounter (Signed)
Pt call back concern about this refill ( she was told that we sent in 2 refill for this med), pt stated Harriett Sine from [facility] stated she need to speak to our office before they can refill this.

## 2016-02-04 NOTE — Progress Notes (Signed)
Pre visit review using our clinic review tool, if applicable. No additional management support is needed unless otherwise documented below in the visit note. 

## 2016-02-05 ENCOUNTER — Other Ambulatory Visit: Payer: Self-pay | Admitting: *Deleted

## 2016-02-05 ENCOUNTER — Other Ambulatory Visit: Payer: Self-pay | Admitting: Internal Medicine

## 2016-02-05 ENCOUNTER — Telehealth: Payer: Self-pay | Admitting: Internal Medicine

## 2016-02-05 NOTE — Telephone Encounter (Signed)
I checked the [place] controlled substances website yesterday and saw that she got a prescription for this in early January and that prescription has 3 remaining refills on it. She needs to go to the pharmacy without prescription was filled and get the remaining refills that her artery waiting for her.

## 2016-02-05 NOTE — Patient Outreach (Signed)
[facility]) Care Management  02/05/2016  Sherri Weber March 30, 1933 [id number]  RN spoke with pt who and introduced the [facility] program and services. Once identifiers completed pt provided permission for RN to speak with her daughter [name] concerning her ongoing medical issues and arrange home visit. Daughter states pt is currently involved with [facility]. Nursing involved for wound care, medication and education on new pacemaker placement in January.  Daughter has once again confirmed pt visiting her doctor on yesterday and several of her medication where changed and/or discontinued but "all straight today". RN inquired on pt's HTN and readings however only able to provide the button portion of her last read 80 and the nurse has left the premises. Pt remains without symptoms however daughter states pt is very anxious today. RN extended an offer for a home visit to further educate pt on her HTN and provider available monitoring tools to assist. Will complete a financial prior to assisting pt with a BP device if needed. Daughter receptive to a visit and it was arranged for next Tuesday morning. Will follow up accordingly.  [name], RN Care Management Coordinator [facility] Main Office [phone]

## 2016-02-05 NOTE — Telephone Encounter (Signed)
Called pharmacy per fill schedule her ativan was only filled once on 10.18.16 for a 30 day supply and has not been filled since. Pt was hospitalized and lost script that was given in Jan with adjusted dosage. This has since been called into the pharmacy.

## 2016-02-05 NOTE — Telephone Encounter (Signed)
pts daughter [name] called stating pharmacy will not refill her LORazepam (ATIVAN) 0.5 MG tablet [[id number] until they receive a call from [place].

## 2016-02-07 MED ORDER — ICOSAPENT ETHYL 1 G PO CAPS: 2.0000 | ORAL_CAPSULE | Freq: Two times a day (BID) | ORAL | Status: AC

## 2016-02-07 NOTE — Assessment & Plan Note (Signed)
She is receiving wound care from home health care services

## 2016-02-08 ENCOUNTER — Telehealth: Payer: Self-pay | Admitting: Internal Medicine

## 2016-02-08 ENCOUNTER — Ambulatory Visit: Payer: Commercial Managed Care - HMO | Admitting: Osteopathic Medicine

## 2016-02-08 NOTE — Telephone Encounter (Signed)
Bobby from [facility] would like you to give her a call. She just left there and she says pt is "climbing the walls". Her daughter is there and they are butting heads and she states that it is a bad situation there.  She's needs to talk with you about this. Her number is [phone]

## 2016-02-09 ENCOUNTER — Other Ambulatory Visit: Payer: Self-pay | Admitting: *Deleted

## 2016-02-09 NOTE — Telephone Encounter (Signed)
Bobby call back request to speak to the assistant. She said that that she want to let our office know what going on with Sherri Weber health condition: anxiety,not sleeping,BP med discontinue and Atenolol was reduce from 100 to 50. Please give her a call back, she is concern.   [phone]

## 2016-02-09 NOTE — Patient Outreach (Signed)
[facility]) Care Management   02/09/2016  Sherri Weber 04-Mar-1933 [id number]  Sherri Weber is an 82 y.o. female  Subjective:  [name]: Pt remains receptive to enrolling into the [facility] program and services. HTN: Pt discussed her BP issues and reports she has had a hospital visit outside of [facility] and her BP medication have been changed. Pt denies any issues at this time however would appreciate a home BP device to monitor her BP more closely. Pt states her finances has increased over the last several months leaving her in a financial burden and she is unable to afford a BP cuff. Reports her BP have been stable ather doctors office with no symptoms or signs that she can recall. Pt admits she is not familiar with symptoms of HTN but receptive to learning today. Pt has indicated that she is following the instructions on the recent changes to her BP medications.  MEDICATION: When reviewing pt's medications one medications pt and daughter were not familiar with and contact the pharmacy to confirm. Pharmacy has the medication available but daughter indicates the medication is $200 (Vascepa). Pt not familiar and does not remember her doctor discussing this medications. Daughter indicated she would contact the doctor's office for possible change in this medication due to the expense if not successful pt aware to contact RN for further assistance on this matter.  WOUND: Pt states [facility] continues to visit for wound care (pressure ulcer to her buttock area). Daughter states pt needs to "move more". Pt states she wants to "get out of the house" and both have been discussing an outing for tomorrow.    Objective:   Review of Systems  Constitutional: Negative.   HENT: Negative.   Eyes: Negative.   Respiratory: Negative.   Cardiovascular: Negative.   Genitourinary: Negative.   Musculoskeletal: Negative.   Skin: Negative.   Neurological: Negative.     Endo/Heme/Allergies: Negative.   Psychiatric/Behavioral: Negative.     Physical Exam  Constitutional: She is oriented to person, place, and time. She appears well-developed and well-nourished.  HENT:  Right Ear: External ear normal.  Left Ear: External ear normal.  Nose: Nose normal.  Eyes: EOM are normal.  Neck: Normal range of motion. Neck supple.  Cardiovascular: Normal heart sounds.   Respiratory: Effort normal and breath sounds normal.  GI: Soft. Bowel sounds are normal.  Musculoskeletal: Normal range of motion.  Neurological: She is alert and oriented to person, place, and time. She has normal reflexes.  Skin: Skin is warm and dry.     Psychiatric: She has a normal mood and affect. Her behavior is normal. Judgment and thought content normal.    Current Medications:   Current Outpatient Prescriptions  Medication Sig Dispense Refill  . atenolol (TENORMIN) 100 MG tablet Take 50 mg by mouth daily.     Marland Kitchen buPROPion (WELLBUTRIN XL) 150 MG 24 hr tablet Take 1 tablet (150 mg total) by mouth daily. 90 tablet 3  . calcium-vitamin D (OSCAL 500/200 D-3) 500-200 MG-UNIT tablet Take 1 tablet by mouth 2 (two) times daily. 180 tablet 3  . fentaNYL (DURAGESIC - DOSED MCG/HR) 12 MCG/HR Place 1 patch (12.5 mcg total) onto the skin every 3 (three) days. 10 patch 0  . loperamide (IMODIUM) 2 MG capsule Reported on 02/04/2016    . LORazepam (ATIVAN) 0.5 MG tablet Take 1 tablet (0.5 mg total) by mouth 2 (two) times daily. 60 tablet 2  . oxybutynin (DITROPAN) 5 MG tablet  Take 1 tablet (5 mg total) by mouth 2 (two) times daily. 60 tablet 1  . zolpidem (AMBIEN) 10 MG tablet TAKE 1 TABLET DAILY AT BEDTIME AS NEEDED FOR SLEEP 30 tablet 2  . gabapentin (NEURONTIN) 100 MG capsule Reported on 02/09/2016    . GAVILYTE-G 236 G solution Reported on 02/09/2016    . Icosapent Ethyl (VASCEPA) 1 g CAPS Take 2 capsules by mouth 2 (two) times daily. (Patient not taking: Reported on 02/09/2016) 120 capsule 11   No  current facility-administered medications for this visit.    Functional Status:   In your present state of health, do you have any difficulty performing the following activities: 02/09/2016  Hearing? N  Vision? N  Difficulty concentrating or making decisions? N  Walking or climbing stairs? Y  Dressing or bathing? N  Doing errands, shopping? Y  Preparing Food and eating ? N  Using the Toilet? N  In the past six months, have you accidently leaked urine? (No Data)  Do you have problems with loss of bowel control? (No Data)  Managing your Medications? Y  Managing your Finances? Y  Housekeeping or managing your Housekeeping? Y    Fall/Depression Screening:    PHQ 2/9 Scores 01/29/2016 09/01/2015 04/30/2015 07/30/2013 06/17/2013  PHQ - 2 Score 0 2 0 0 2  PHQ- 9 Score - - - - 13   Filed Vitals:   02/09/16 1136  BP: 108/62  Pulse: 62  Resp: 20   Assessment:   Introduction to the [facility] program and services Case management related to HTN Non-adherence with medication Wound care related to right buttock (immobility) Plan:  Will obtained a signed consent for [facility] services and completed physical assessment on this home visit. Will began education on HTN and provide printed material provided via EMMI and tools related to hyper-hypotension for daily monitoring. Will also discuss what to do if acute symptoms should occur.  Will discussed signs and symptoms of hypertension and review the "ASK ME THREE" questions for improving her medical condition. Will also complete a financial form and provide pt with a home BP device with demonstration on how to use this device. Will encouraged pt on daily BP checks and document all readings for providers to view in the [facility] calendar tool book. Will obtain a manual blood pressures and device pressure and education pt on hyper-hypotension and what to if symptoms occur.  Will review all medications and strongly encourage pt to follow up with her provider due to one  medication pt is unable to afford [place]) for possible samples or a simular medication (daughter indicated she would contact pt's provider. Note pt was not aware that this medication had recently been prescribed and awaiting pick up at her local pharmacy until all medications were reviewed by this RN on today's assessment.  Daughter aware to contact this RN if issues arise were no intervention available from her provider on this medication.  Will stress the importance of mobility however with safety measures for strengthening. Will also encouraged pt to keep a clean environment around the dressing and wound area to prevent possible risk of infection. Will verified [facility]'s involvement with wound care and encourage to inquired and report any changes to her wound area.  Will discuss a plan of care and goals that will be re-evaluated on the next scheduled home visit. Offered to schedule next home visit today (pt receptive). No other issues or inquires as pt will sent the intitial report to her primary provider and  follow up accordingly with a quarterly report.   [name], RN Care Management Coordinator [facility] Main Office [phone]

## 2016-02-10 ENCOUNTER — Telehealth: Payer: Self-pay | Admitting: Internal Medicine

## 2016-02-10 ENCOUNTER — Encounter: Payer: Self-pay | Admitting: *Deleted

## 2016-02-10 DIAGNOSIS — S32001S Stable burst fracture of unspecified lumbar vertebra, sequela: Secondary | ICD-10-CM

## 2016-02-10 DIAGNOSIS — M545 Low back pain, unspecified: Secondary | ICD-10-CM

## 2016-02-10 DIAGNOSIS — G8929 Other chronic pain: Secondary | ICD-10-CM

## 2016-02-10 DIAGNOSIS — S32810D Multiple fractures of pelvis with stable disruption of pelvic ring, subsequent encounter for fracture with routine healing: Secondary | ICD-10-CM

## 2016-02-10 NOTE — Telephone Encounter (Signed)
Daughter is stating that insurance is not covering fentanyl and vascepa at a low cost.  States one is $100 and the other is $200.  Would like something different sent into pharmacy in place of these medications.

## 2016-02-10 NOTE — Telephone Encounter (Signed)
[phone] Karen Kitchens (nurse with [facility]) - is requesting call back in regards to dysfunction going on in the home.  Would like to talk to [facility] or Dr. Yetta Barre in reference to what she really thinks is going on with patients medication.  Please call back in regards.

## 2016-02-10 NOTE — Telephone Encounter (Signed)
Is requesting referral to a pain management clinic in [place]

## 2016-02-10 NOTE — Telephone Encounter (Signed)
They do not have a specific clinic.  They are just looking for a clinic in or as close to [place].

## 2016-02-10 NOTE — Telephone Encounter (Signed)
Let the nurses from home health care note that the daughter has a history of substance abuse.   If the home is not as safe space then authorities should be called.

## 2016-02-10 NOTE — Telephone Encounter (Signed)
Nurse states daughter is possible controlling ativan script while she is in town. This nurse is new the pt and is not familiar with hx of med or family issues. She states Sherri Weber is requesting her vycodin back and it has been explained to her that it  will no longer be prescribed that and has been referred to pain management to deal with pain. Atenol was cut 509 to mg this has been noted in chart Will call is pt start to have a bp spike or edema

## 2016-02-10 NOTE — Telephone Encounter (Signed)
I need the name of the doctor

## 2016-02-10 NOTE — Telephone Encounter (Signed)
Referral ordered

## 2016-02-10 NOTE — Telephone Encounter (Signed)
1st attempt - unable to leave VM.

## 2016-02-10 NOTE — Telephone Encounter (Signed)
Nurse has been informed.

## 2016-02-10 NOTE — Telephone Encounter (Signed)
Vascepa samples have been left at the front. As far as fentanyl patches go. A referral has been put in for pain management and they will be contacting pt in order to manager her pain meds

## 2016-02-12 ENCOUNTER — Encounter: Payer: Self-pay | Admitting: *Deleted

## 2016-02-16 ENCOUNTER — Ambulatory Visit (INDEPENDENT_AMBULATORY_CARE_PROVIDER_SITE_OTHER): Payer: Commercial Managed Care - HMO | Admitting: Internal Medicine

## 2016-02-16 ENCOUNTER — Encounter: Payer: Self-pay | Admitting: Internal Medicine

## 2016-02-16 VITALS — BP 108/68 | HR 58 | Temp 97.7°F | Resp 16 | Ht 65.0 in | Wt 122.0 lb

## 2016-02-16 DIAGNOSIS — M150 Primary generalized (osteo)arthritis: Secondary | ICD-10-CM | POA: Diagnosis not present

## 2016-02-16 DIAGNOSIS — M8080XS Other osteoporosis with current pathological fracture, unspecified site, sequela: Secondary | ICD-10-CM

## 2016-02-16 DIAGNOSIS — M159 Polyosteoarthritis, unspecified: Secondary | ICD-10-CM

## 2016-02-16 DIAGNOSIS — S32000D Wedge compression fracture of unspecified lumbar vertebra, subsequent encounter for fracture with routine healing: Secondary | ICD-10-CM

## 2016-02-16 DIAGNOSIS — M8949 Other hypertrophic osteoarthropathy, multiple sites: Secondary | ICD-10-CM

## 2016-02-16 MED ORDER — FENTANYL 25 MCG/HR TD PT72: 25.0000 ug | MEDICATED_PATCH | TRANSDERMAL | Status: DC

## 2016-02-16 NOTE — Patient Instructions (Signed)

## 2016-02-16 NOTE — Progress Notes (Signed)
Pre visit review using our clinic review tool, if applicable. No additional management support is needed unless otherwise documented below in the visit note. 

## 2016-02-17 NOTE — Telephone Encounter (Signed)
Patient has found a pain management office in [place] at [facility].  Requesting referral to go to that office.  Their phone number is [phone] and fax is [phone].

## 2016-02-17 NOTE — Telephone Encounter (Signed)
Faxed referral to [facility] at [facility], awaiting appt

## 2016-02-18 NOTE — Progress Notes (Signed)
Subjective:  Patient ID: Sherri Weber, female    DOB: 1933-09-16  Age: 82 y.o. MRN: [id number]  CC: Osteoarthritis; Back Pain; and Hypertension   HPI Sherri Weber presents for follow-up, she is with her daughter today and most of the complaints surround pain, she has chronic back pain, joint pain, and now painful decubitus ulcers on her buttocks. The last time I saw her she started a fentanyl patch. She was charged $67 for this. I have spoken to her pharmacist and he told me that if he runs the Rx without using her insurance then he can cut the price in half. She states the 12.5 g dose has helped some but she wants to try a higher dose. She also wants to be referred to a pain clinic in [place].  Outpatient Prescriptions Prior to Visit  Medication Sig Dispense Refill  . atenolol (TENORMIN) 100 MG tablet Take 50 mg by mouth daily.     Marland Kitchen buPROPion (WELLBUTRIN XL) 150 MG 24 hr tablet Take 1 tablet (150 mg total) by mouth daily. 90 tablet 3  . calcium-vitamin D (OSCAL 500/200 D-3) 500-200 MG-UNIT tablet Take 1 tablet by mouth 2 (two) times daily. 180 tablet 3  . gabapentin (NEURONTIN) 100 MG capsule Reported on 02/09/2016    . GAVILYTE-G 236 G solution Reported on 02/09/2016    . loperamide (IMODIUM) 2 MG capsule Reported on 02/04/2016    . LORazepam (ATIVAN) 0.5 MG tablet Take 1 tablet (0.5 mg total) by mouth 2 (two) times daily. 60 tablet 2  . oxybutynin (DITROPAN) 5 MG tablet Take 1 tablet (5 mg total) by mouth 2 (two) times daily. 60 tablet 1  . zolpidem (AMBIEN) 10 MG tablet TAKE 1 TABLET DAILY AT BEDTIME AS NEEDED FOR SLEEP 30 tablet 2  . fentaNYL (DURAGESIC - DOSED MCG/HR) 12 MCG/HR Place 1 patch (12.5 mcg total) onto the skin every 3 (three) days. 10 patch 0  . Icosapent Ethyl (VASCEPA) 1 g CAPS Take 2 capsules by mouth 2 (two) times daily. (Patient not taking: Reported on 02/09/2016) 120 capsule 11   No facility-administered medications prior to visit.    ROS Review of  Systems  Constitutional: Negative.  Negative for fever, chills, diaphoresis, appetite change and fatigue.  HENT: Negative.   Eyes: Negative.  Negative for visual disturbance.  Respiratory: Negative.  Negative for cough, choking, chest tightness, shortness of breath and stridor.   Cardiovascular: Negative.  Negative for chest pain, palpitations and leg swelling.  Gastrointestinal: Negative.  Negative for nausea, vomiting, abdominal pain, diarrhea and constipation.  Endocrine: Negative.   Genitourinary: Negative.  Negative for difficulty urinating.  Musculoskeletal: Positive for back pain and arthralgias. Negative for myalgias, joint swelling and gait problem.  Skin: Positive for wound. Negative for color change, pallor and rash.  Allergic/Immunologic: Negative.   Neurological: Negative.  Negative for dizziness, tremors, weakness, numbness and headaches.  Hematological: Negative.  Negative for adenopathy. Does not bruise/bleed easily.  Psychiatric/Behavioral: Positive for sleep disturbance and dysphoric mood. Negative for suicidal ideas, hallucinations, behavioral problems, confusion, self-injury, decreased concentration and agitation. The patient is nervous/anxious. The patient is not hyperactive.     Objective:  BP 108/68 mmHg  Pulse 58  Temp(Src) 97.7 F (36.5 C) (Oral)  Ht  (1.651 m)  Wt 122 lb (55.339 kg)  BMI 20.30 kg/m2  SpO2 96%  BP Readings from Last 3 Encounters:  02/16/16 108/68  02/09/16 108/62  02/04/16 104/60    Wt Readings from Last  3 Encounters:  02/16/16 122 lb (55.339 kg)  02/09/16 125 lb (56.7 kg)  02/04/16 125 lb 4 oz (56.813 kg)    Physical Exam  Constitutional: She is oriented to person, place, and time. She appears well-developed and well-nourished. No distress.  HENT:  Mouth/Throat: Oropharynx is clear and moist. No oropharyngeal exudate.  Eyes: Conjunctivae are normal. Right eye exhibits no discharge. Left eye exhibits no discharge. No scleral  icterus.  Neck: Normal range of motion. Neck supple. No JVD present. No tracheal deviation present. No thyromegaly present.  Cardiovascular: Normal rate, regular rhythm, normal heart sounds and intact distal pulses.  Exam reveals no gallop and no friction rub.   No murmur heard. Pulmonary/Chest: Effort normal and breath sounds normal. No stridor. No respiratory distress. She has no wheezes. She has no rales. She exhibits no tenderness.  Abdominal: Soft. Bowel sounds are normal. She exhibits no distension and no mass. There is no tenderness. There is no rebound and no guarding.  Musculoskeletal: Normal range of motion. She exhibits no edema or tenderness.  Lymphadenopathy:    She has no cervical adenopathy.  Neurological: She is oriented to person, place, and time.  Skin: Skin is warm and dry. No rash noted. She is not diaphoretic. No erythema. No pallor.  Vitals reviewed.   Lab Results  Component Value Date   WBC 6.5 02/04/2016   HGB 14.1 02/04/2016   HCT 41.6 02/04/2016   PLT 270.0 02/04/2016   GLUCOSE 100* 02/04/2016   CHOL 191 02/04/2016   TRIG * 02/04/2016    439.0 Triglyceride is over 400; calculations on Lipids are invalid.   HDL 29.90* 02/04/2016   LDLDIRECT 72.0 02/04/2016   LDLCALC 122* 02/24/2014   ALT 10 07/24/2015   AST 14 07/24/2015   NA 136 02/04/2016   K 4.7 02/04/2016   CL 101 02/04/2016   CREATININE 1.13 02/04/2016   BUN 26* 02/04/2016   CO2 29 02/04/2016   TSH 4.69* 02/04/2016   INR 0.95 04/28/2013    Dg Chest 2 View  05/08/2014  CLINICAL DATA:  Left-sided pain post fall EXAM: CHEST  2 VIEW COMPARISON:  04/28/2013. FINDINGS: Cardiomediastinal silhouette is stable. Bronchiectasis right infrahilar region again noted. No acute infiltrate or pulmonary edema. There is cortical step-off left sixth rib suspicious for fracture. Clinical correlation is necessary. No pneumothorax. Mild degenerative changes thoracic spine. IMPRESSION: Bronchiectasis right infrahilar  region again noted. No acute infiltrate or pulmonary edema. There is cortical step-off left sixth rib suspicious for fracture. Clinical correlation is necessary. No pneumothorax. Mild degenerative changes thoracic spine. Electronically Signed   By: Natasha Mead M.D.   On: 05/08/2014 09:14    Assessment & Plan:   [place] was seen today for osteoarthritis, back pain and hypertension.  Diagnoses and all orders for this visit:  Primary osteoarthritis involving multiple joints -     Discontinue: fentaNYL (DURAGESIC - DOSED MCG/HR) 25 MCG/HR patch; Place 1 patch (25 mcg total) onto the skin every 3 (three) days. -     Discontinue: fentaNYL (DURAGESIC - DOSED MCG/HR) 25 MCG/HR patch; Place 1 patch (25 mcg total) onto the skin every 3 (three) days. -     fentaNYL (DURAGESIC - DOSED MCG/HR) 25 MCG/HR patch; Place 1 patch (25 mcg total) onto the skin every 3 (three) days. -     Ambulatory referral to Pain Clinic  Closed wedge compression fracture of lumbar vertebra with routine healing, unspecified lumbar vertebral level, subsequent encounter -     Discontinue:  fentaNYL (DURAGESIC - DOSED MCG/HR) 25 MCG/HR patch; Place 1 patch (25 mcg total) onto the skin every 3 (three) days. -     Discontinue: fentaNYL (DURAGESIC - DOSED MCG/HR) 25 MCG/HR patch; Place 1 patch (25 mcg total) onto the skin every 3 (three) days. -     fentaNYL (DURAGESIC - DOSED MCG/HR) 25 MCG/HR patch; Place 1 patch (25 mcg total) onto the skin every 3 (three) days. -     Ambulatory referral to Pain Clinic  Osteoporosis with fracture, sequela -     Discontinue: fentaNYL (DURAGESIC - DOSED MCG/HR) 25 MCG/HR patch; Place 1 patch (25 mcg total) onto the skin every 3 (three) days. -     Discontinue: fentaNYL (DURAGESIC - DOSED MCG/HR) 25 MCG/HR patch; Place 1 patch (25 mcg total) onto the skin every 3 (three) days. -     fentaNYL (DURAGESIC - DOSED MCG/HR) 25 MCG/HR patch; Place 1 patch (25 mcg total) onto the skin every 3 (three) days. -      Ambulatory referral to Pain Clinic   I am having Ms. Margraf maintain her gabapentin, loperamide, GAVILYTE-G, calcium-vitamin D, LORazepam, buPROPion, oxybutynin, zolpidem, atenolol, Icosapent Ethyl, and fentaNYL.  Meds ordered this encounter  Medications  . DISCONTD: fentaNYL (DURAGESIC - DOSED MCG/HR) 25 MCG/HR patch    Sig: Place 1 patch (25 mcg total) onto the skin every 3 (three) days.    Dispense:  10 patch    Refill:  0  . DISCONTD: fentaNYL (DURAGESIC - DOSED MCG/HR) 25 MCG/HR patch    Sig: Place 1 patch (25 mcg total) onto the skin every 3 (three) days.    Dispense:  10 patch    Refill:  0    Fill on or after 03/15/16  . fentaNYL (DURAGESIC - DOSED MCG/HR) 25 MCG/HR patch    Sig: Place 1 patch (25 mcg total) onto the skin every 3 (three) days.    Dispense:  10 patch    Refill:  0    Fill on or after 04/15/16     Follow-up: Return in about 3 months (around 05/15/2016).  Sanda Linger, MD

## 2016-02-19 ENCOUNTER — Other Ambulatory Visit: Payer: Self-pay | Admitting: Internal Medicine

## 2016-02-25 ENCOUNTER — Telehealth: Payer: Self-pay | Admitting: Internal Medicine

## 2016-02-25 NOTE — Telephone Encounter (Signed)
°  Home health nurse called in [place] [phone]  Need to change some orders for her wound.  Can you call her

## 2016-02-25 NOTE — Telephone Encounter (Signed)
Wound on the right has healed.  Wound on the left is almost healed but has a bubble and some hypergranulation.   Wound MD gave the advised to put Mesalt (normal saline dressing) on the wound it should heal within a week. Recommended for 3 x wk for 1 wk.  HH stated that Calazime Cream is recommended for her bottom to act as a skin barrier and prevent sores.  Please advise on both requests at your convenience.

## 2016-02-28 NOTE — Telephone Encounter (Signed)
Yes to the requests

## 2016-02-29 NOTE — Telephone Encounter (Signed)
lvm with HH with the below verbal orders.

## 2016-03-03 ENCOUNTER — Telehealth: Payer: Self-pay | Admitting: Internal Medicine

## 2016-03-03 NOTE — Telephone Encounter (Signed)
Sherri KitchensBobbie just came from the patients house. Calling about Visual merchandiserpace maker.  She wanted to advise that there is a consistent HR of 60. Atenolol and losartan has been d/c by cardiologist. When she saw her today, the hr was running 88-90. bp was elevated to 154/94. Patient has diarrhea yesterday, and the patient has remained elevated in bp despite hydration attempts.   Please advise Sherri Weber on plan for continuing medications or not.

## 2016-03-07 ENCOUNTER — Telehealth: Payer: Self-pay | Admitting: Internal Medicine

## 2016-03-07 NOTE — Telephone Encounter (Signed)
This should be discussed with her cardiologist

## 2016-03-07 NOTE — Telephone Encounter (Signed)
Notified Bobbie e/MD response. She stated that they did contact her cardiologist as well. Everything is back to normal she had some type of virus which was causing her to be tachycardia. HR is back down to 60...[name]/lmb

## 2016-03-07 NOTE — Telephone Encounter (Signed)
Verbal given 

## 2016-03-07 NOTE — Telephone Encounter (Signed)
Requesting orders for wound care orders for cleaning with wound cleanser and then apply skin press and then odor absorb cream to bed then cover with alginate and bordered guaze.  Continue wound care for 2 weeks.

## 2016-03-08 ENCOUNTER — Other Ambulatory Visit: Payer: Self-pay | Admitting: *Deleted

## 2016-03-08 NOTE — Patient Outreach (Signed)
[name]) Care Management  03/08/2016  Sherri Weber 09/01/1933 [id number]   RN had a scheduled home visit with pt today however upon arrival daughter indicated pt not feeling well and requested to reschedule. Daughter indicated pt had a ED visit on Saturday due to her elevated BP and HR readings on the home BP device in the 100's. Daughter states pt has a pacer and it was checked on her [employer] device this week. States her rate should be around 60 beats. Daughter indicates currently pt's BP medications are being altered but they have not contacted the pt's primary with and update. Pt has requested I contact her later this month to possible reschedule a home visit. Daughter states pt needs to "rest".  RN inquired on the medication issues last month with pt's Vascepa as daughter indicated she took care of it. Both very apologetic for the visit today but pt remains receptive [facility]. RN offered several times to take pt's BP however both parties refused. RN will follow up accordingly as pt has requested and reschedule today's home visit assessment.   Elliot CousinLisa Ukiah Trawick, RN Care Management Coordinator [facility] Main Office [phone]

## 2016-03-10 ENCOUNTER — Telehealth: Payer: Self-pay

## 2016-03-10 DIAGNOSIS — L89310 Pressure ulcer of right buttock, unstageable: Secondary | ICD-10-CM | POA: Diagnosis not present

## 2016-03-10 NOTE — Telephone Encounter (Signed)
Fax received and given to PCP for signature. Charge sheet attached 

## 2016-03-14 ENCOUNTER — Telehealth: Payer: Self-pay | Admitting: Internal Medicine

## 2016-03-14 DIAGNOSIS — F418 Other specified anxiety disorders: Secondary | ICD-10-CM

## 2016-03-14 MED ORDER — DULOXETINE HCL 30 MG PO CPEP: 30.0000 mg | ORAL_CAPSULE | Freq: Every day | ORAL | Status: AC

## 2016-03-14 NOTE — Telephone Encounter (Signed)
Called robin left vm

## 2016-03-14 NOTE — Telephone Encounter (Signed)
Patients caregiver/daughter called advising that the patient has been experiencing uncontrolled BP spikes. She states that she is having uncontrolled pain and the patches do not seem to be working. She is trying to schedule an appt ASAP, but no access today. How would you like for me to proceed?   She is asking that wellbutrin be changed back to cymbalta, the lorazepam dosage be increased, and the losartan be restarted.

## 2016-03-14 NOTE — Telephone Encounter (Signed)
Prescription for Cymbalta sent to her pharmacy. No increase on the lorazepam and change in the fentanyl patches. Her blood pressure has been recently been managed by her cardiologist so I asked that she send any request about her blood pressure to the cardiologist.

## 2016-03-15 NOTE — Telephone Encounter (Signed)
Copy sent to scan and copy placed in Dahlia's office with charge capture sheet  

## 2016-03-16 ENCOUNTER — Telehealth: Payer: Self-pay | Admitting: Internal Medicine

## 2016-03-16 NOTE — Telephone Encounter (Signed)
Bobbie from [facility] called to let you know they are discharging pt. Her wounds are completely heeled. If you need anything else please call her at [phone]

## 2016-03-16 NOTE — Telephone Encounter (Signed)
Pt daughter called today upset because we were "supposed to get her into [place] office and didn't". I told her I'm not sure what office that is but for hospital f/u she does need to come here for the appt. She states she will call back.

## 2016-03-17 ENCOUNTER — Encounter: Payer: Self-pay | Admitting: *Deleted

## 2016-03-17 ENCOUNTER — Other Ambulatory Visit: Payer: Self-pay | Admitting: *Deleted

## 2016-03-17 ENCOUNTER — Ambulatory Visit: Payer: Commercial Managed Care - HMO | Admitting: Internal Medicine

## 2016-03-17 NOTE — Patient Outreach (Signed)
[name]) Care Management  03/17/2016  Manfred ArchShirley G Weber Jun 01, 1933 [id number]  Pt returned the call to RN and indicated she is okay today however had a ED visit on yesterday. RN inquired on rescheduling home visit however pt declined and indicated she [name]. RN inquired on ongoing services at a more convenient day or possible health coach however pt continued to decline all [name] services at this time. Pt feels with her daughter involved in her care and agencies involved pt has opt to discontinue ongoing [facility] services at this time. RN will inform pt's primary provider that pt has opt to decline [name] case management and disease management services. Will close this case and provider pt with contact number if needed in the future.  Elliot CousinLisa Margaretta Chittum, RN Care Management Coordinator [facility] Main Office [phone]

## 2016-03-17 NOTE — Telephone Encounter (Signed)
FYI

## 2016-03-17 NOTE — Patient Outreach (Signed)
[name]) Care Management  03/17/2016  Manfred ArchShirley G Tonkovich 1933-04-18 [id number]  Rn attempted outreach call to rescheduled the home visit for this month. Note pt not feeling well on the day of the home visit as RN was in the presence of the pt and offered to assist. Pt declined the assessment and requested to reschedule. Will continue to follow up for possible reschedule for [facility].  Elliot CousinLisa Jemima Petko, RN Care Management Coordinator [facility] Main Office [phone]

## 2016-06-27 ENCOUNTER — Other Ambulatory Visit: Payer: Self-pay | Admitting: Internal Medicine

## 2019-03-04 ENCOUNTER — Ambulatory Visit: Payer: Self-pay | Admitting: Osteopathic Medicine

## 2019-03-04 ENCOUNTER — Telehealth: Payer: Self-pay | Admitting: Osteopathic Medicine

## 2019-03-04 NOTE — Telephone Encounter (Signed)
Spoke to daughter about this appointment and if mother would still like to establish care with [place]. She said that mother does want to be a patient but they are working some things out with insurance first. Once its gets straightened out she will call back to schedule appointment.

## 2019-03-04 NOTE — Telephone Encounter (Signed)
No-show to establish care with Dr. Lyn Hollingshead, 03/04/19. If late or no-show again, will not accept patient to this clinic. Please call her to make sure everything is ok, reschedule visit if desired, and inform them of this policy.

## 2019-06-24 ENCOUNTER — Encounter: Payer: Self-pay | Admitting: Family Medicine

## 2019-06-24 ENCOUNTER — Ambulatory Visit (INDEPENDENT_AMBULATORY_CARE_PROVIDER_SITE_OTHER): Payer: Medicare HMO | Admitting: Family Medicine

## 2019-06-24 VITALS — BP 134/74 | HR 76 | Temp 98.3°F | Wt 138.0 lb

## 2019-06-24 DIAGNOSIS — M79604 Pain in right leg: Secondary | ICD-10-CM

## 2019-06-24 MED ORDER — DICLOFENAC SODIUM 1 % TD GEL: 4.0000 g | Freq: Four times a day (QID) | TRANSDERMAL | 11 refills | Status: AC

## 2019-06-24 NOTE — Progress Notes (Signed)
Sherri Weber is a 85 y.o. female who presents to [place] today for knee pain Patient fell and was seen in the emergency department at [facility] on March 2.  She hit her head and at that time CT scan was unremarkable.  Additionally she had injury to her leg and x-ray femur showed surgical hardware in place and some degenerative changes but no fracture.  In the interim she notes that continued pain in right proximal femur and lateral knee.  The pain continued.  Ultimately she did follow-up with orthopedics with Dr. Rip Harbour.  She reports having a knee x-ray that was apparently unremarkable and was recommended for physical therapy.  Additionally she had a cortisone injection in her right knee.  This all helped some.  She never had the opportunity to attend physical therapy.  She continues to have pain in the lateral knee at the distal femur.    ROS:  As above  Exam:  BP 134/74   Pulse 76   Temp 98.3 F (36.8 C) (Oral)   Wt 138 lb (62.6 kg)   BMI 22.96 kg/m  Wt Readings from Last 5 Encounters:  06/24/19 138 lb (62.6 kg)  02/16/16 122 lb (55.3 kg)  02/09/16 125 lb (56.7 kg)  02/04/16 125 lb 4 oz (56.8 kg)  01/29/16 132 lb (59.9 kg)   General: Well Developed, well nourished, and in no acute distress.  Neuro/Psych: Alert and oriented x3, extra-ocular muscles intact, able to move all 4 extremities, sensation grossly intact. Skin: Warm and dry, no rashes noted.  Respiratory: Not using accessory muscles, speaking in full sentences, trachea midline.  Cardiovascular: Pulses palpable, no extremity edema. Abdomen: Does not appear distended. MSK: Right knee: Normal-appearing.  Normal motion.  Tender palpation along IT band and distal femur laterally. Intact strength.  Stable ligamentous  exam.    Lab and Radiology Results X-ray report femur reviewed from [facility]    Assessment and Plan: 85 y.o. female with  Right lateral knee pain.   Likely exacerbation of DJD or strain of IT band or other structure at the lateral knee.  Plan to proceed with trial of physical therapy.  Additionally will use diclofenac gel.  Recheck in a few weeks if not improving.   PDMP not reviewed this encounter. Orders Placed This Encounter  Procedures  . Ambulatory referral to Physical Therapy    Referral Priority:   Routine    Referral Type:   Physical Medicine    Referral Reason:   Specialty Services Required    Requested Specialty:   Physical Therapy   Meds ordered this encounter  Medications  . diclofenac sodium (VOLTAREN) 1 % GEL    Sig: Apply 4 g topically 4 (four) times daily. To affected joint.    Dispense:  100 g    Refill:  11    Historical information moved to improve visibility of documentation.  Past Medical History:  Diagnosis Date  . Abdominal distension   . Chronic constipation   . Depressive disorder, not elsewhere classified   . Diarrhea   . Family history of malignant neoplasm of gastrointestinal tract   . Hemorrhoids   . Irritable bowel syndrome   . Low back pain   . Osteoarthritis   . Other malaise and fatigue   . Rectal prolapse   . Ulcer of anus and rectum    Past Surgical History:  Procedure Laterality Date  . ABDOMINAL HYSTERECTOMY    . APPENDECTOMY    .  cecectomy    . CHOLECYSTECTOMY    . COLON SURGERY    . colostomy    . HEMICOLECTOMY    . HIP ARTHROPLASTY Left 04/29/2013   Procedure: ARTHROPLASTY BIPOLAR HIP;  Surgeon: Shelda PalMatthew D Olin, MD;  Location: [facility] ORS;  Service: Orthopedics;  Laterality: Left;   Social History   Tobacco Use  . Smoking status: Never Smoker  . Smokeless tobacco: Never Used  Substance Use Topics  . Alcohol use: No    Comment: Social.   family history includes Cancer in her sister; Diabetes in her sister; Emphysema in her mother; Mental retardation in her maternal uncle; Stroke in her father and sister.  Medications: Current Outpatient Medications  Medication Sig  Dispense Refill  . escitalopram (LEXAPRO) 20 MG tablet Take by mouth.    . furosemide (LASIX) 20 MG tablet Take by mouth.    . mirtazapine (REMERON) 15 MG tablet TAKE 1 TABLET(15 MG) BY MOUTH AT BEDTIME    . oxyCODONE (ROXICODONE) 15 MG immediate release tablet     . oxyCODONE ER (XTAMPZA ER) 18 MG C12A Take by mouth.    . QUEtiapine (SEROQUEL) 50 MG tablet Take by mouth.    . zolpidem (AMBIEN) 10 MG tablet 1 tablet at bedtime as needed    . atenolol (TENORMIN) 100 MG tablet Take 50 mg by mouth daily.     . calcium-vitamin D (OSCAL 500/200 D-3) 500-200 MG-UNIT tablet Take 1 tablet by mouth 2 (two) times daily. (Patient not taking: Reported on 06/24/2019) 180 tablet 3  . diclofenac sodium (VOLTAREN) 1 % GEL Apply 4 g topically 4 (four) times daily. To affected joint. 100 g 11  . DULoxetine (CYMBALTA) 30 MG capsule Take 1 capsule (30 mg total) by mouth daily. (Patient not taking: Reported on 06/24/2019) 30 capsule 11  . gabapentin (NEURONTIN) 100 MG capsule Reported on 02/09/2016    . GAVILYTE-G 236 G solution Reported on 02/09/2016    . Icosapent Ethyl (VASCEPA) 1 g CAPS Take 2 capsules by mouth 2 (two) times daily. (Patient not taking: Reported on 02/09/2016) 120 capsule 11  . loperamide (IMODIUM) 2 MG capsule Reported on 02/04/2016    . oxybutynin (DITROPAN) 5 MG tablet Take 1 tablet (5 mg total) by mouth 2 (two) times daily. (Patient not taking: Reported on 06/24/2019) 60 tablet 0  . zolpidem (AMBIEN) 10 MG tablet TAKE 1 TABLET DAILY AT BEDTIME AS NEEDED FOR SLEEP (Patient not taking: Reported on 06/24/2019) 30 tablet 2   No current facility-administered medications for this visit.    No Known Allergies    Discussed warning signs or symptoms. Please see discharge instructions. Patient expresses understanding.

## 2019-06-24 NOTE — Patient Instructions (Addendum)
Thank you for coming in today. Please attend PT.   Recheck with me in 1 month.  Return sooner if needed.  Use the diclofenac gel to the knee.    If not doing well return sooner if needed.

## 2019-06-25 ENCOUNTER — Encounter: Payer: Self-pay | Admitting: Family Medicine

## 2019-07-03 ENCOUNTER — Ambulatory Visit: Payer: Medicare HMO | Admitting: Rehabilitative and Restorative Service Providers"

## 2019-07-22 ENCOUNTER — Ambulatory Visit: Payer: Medicare HMO | Admitting: Family Medicine

## 2019-11-26 ENCOUNTER — Other Ambulatory Visit: Payer: Self-pay

## 2019-11-26 ENCOUNTER — Encounter: Payer: Self-pay | Admitting: Emergency Medicine

## 2019-11-26 ENCOUNTER — Emergency Department (INDEPENDENT_AMBULATORY_CARE_PROVIDER_SITE_OTHER): Payer: Medicare HMO

## 2019-11-26 ENCOUNTER — Emergency Department (INDEPENDENT_AMBULATORY_CARE_PROVIDER_SITE_OTHER)
Admission: EM | Admit: 2019-11-26 | Discharge: 2019-11-26 | Disposition: A | Discharge status: Home / Self Care | Payer: Medicare HMO | Source: Home / Self Care | Attending: Family Medicine | Admitting: Family Medicine

## 2019-11-26 ENCOUNTER — Telehealth: Payer: Self-pay | Admitting: Emergency Medicine

## 2019-11-26 DIAGNOSIS — M25572 Pain in left ankle and joints of left foot: Secondary | ICD-10-CM

## 2019-11-26 DIAGNOSIS — M79652 Pain in left thigh: Secondary | ICD-10-CM | POA: Diagnosis not present

## 2019-11-26 DIAGNOSIS — S93401A Sprain of unspecified ligament of right ankle, initial encounter: Secondary | ICD-10-CM | POA: Diagnosis not present

## 2019-11-26 DIAGNOSIS — M1712 Unilateral primary osteoarthritis, left knee: Secondary | ICD-10-CM

## 2019-11-26 DIAGNOSIS — M25561 Pain in right knee: Secondary | ICD-10-CM

## 2019-11-26 NOTE — ED Triage Notes (Signed)
Patient fell using walker 2 weeks ago; states seen in [place] ER where xray was negative for fracture of left ankle; she believes the residual pain in left lower leg and knee is not normal.  Has had influenza vacc this season.  No known contact with covid positive person, nor has she travelled.

## 2019-11-26 NOTE — ED Provider Notes (Signed)
[facility]    CSN: [id number] Arrival date & time: 11/26/19  1521      History   Chief Complaint Chief Complaint  Patient presents with  . Leg Pain    HPI Sherri Weber is a 85 y.o. female.   Patient fell 9 days ago, injuring her left foot and ankle.  She was seen in the [facility] ED where X-rays of her left foot and ankle were negative.  She was diagnosed with sprains at that time and her ankle has felt unstable.  Consequently she twisted her left ankle several days ago, and now has had persistent pain in her left ankle, knee and left hip.  She has a chronic ulcer on her left posterior medial heel, followed by pain clinic.  Her daughter has acquired a velcro ankle brace, but the patient's heel bandage makes it difficult to wear ankle or foot brace.    The history is provided by the patient and a relative.    Past Medical History:  Diagnosis Date  . Abdominal distension   . Chronic constipation   . Depressive disorder, not elsewhere classified   . Diarrhea   . Family history of malignant neoplasm of gastrointestinal tract   . Hemorrhoids   . Irritable bowel syndrome   . Low back pain   . Osteoarthritis   . Other malaise and fatigue   . Rectal prolapse   . Ulcer of anus and rectum     Patient Active Problem List   Diagnosis Date Noted  . Decubitus ulcer of buttock 01/05/2016  . Insomnia due to anxiety and fear 11/30/2015  . Multiple pelvic fractures, with routine healing, subsequent encounter 11/30/2015  . Osteoporosis with fracture 03/18/2015  . Essential hypertension 09/29/2014  . Chronic venous insufficiency 06/20/2014  . Osteoarthritis 05/29/2014  . Lumbar vertebral fracture ([place]) 04/18/2014  . Colostomy care ([place]) 03/27/2013  . 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 B12 deficiency anemia 10/15/2012  . OAB (overactive bladder) 07/18/2011  . Depression with anxiety 03/14/2011  . GERD  11/01/2010  . Hypertriglyceridemia 07/01/2009  . LOW BACK PAIN 06/03/2009  . IBS 04/22/2008    Past Surgical History:  Procedure Laterality Date  . ABDOMINAL HYSTERECTOMY    . APPENDECTOMY    . cecectomy    . CHOLECYSTECTOMY    . COLON SURGERY    . colostomy    . HEMICOLECTOMY    . HIP ARTHROPLASTY Left 04/29/2013   Procedure: ARTHROPLASTY BIPOLAR HIP;  Surgeon: Mauri Pole, MD;  Location: [facility] ORS;  Service: Orthopedics;  Laterality: Left;    OB History   No obstetric history on file.      Home Medications    Prior to Admission medications   Medication Sig Start Date End Date Taking? Authorizing Provider  atenolol (TENORMIN) 100 MG tablet Take 50 mg by mouth daily.  02/01/16 01/31/17  [provider]  calcium-vitamin D (OSCAL 500/200 D-3) 500-200 MG-UNIT tablet Take 1 tablet by mouth 2 (two) times daily. Patient not taking: Reported on 06/24/2019 12/03/15   Janith Lima, MD  diclofenac sodium (VOLTAREN) 1 % GEL Apply 4 g topically 4 (four) times daily. To affected joint. 06/24/19   Gregor Hams, MD  DULoxetine (CYMBALTA) 30 MG capsule Take 1 capsule (30 mg total) by mouth daily. Patient not taking: Reported on 06/24/2019 03/14/16   Janith Lima, MD  escitalopram (LEXAPRO) 20 MG tablet Take by mouth. 11/29/18  [provider]  furosemide (LASIX) 20 MG tablet Take by mouth. 04/01/19 04/02/22  [provider]  gabapentin (NEURONTIN) 100 MG capsule Reported on 02/09/2016 06/24/15   [provider]  GAVILYTE-G 236 G solution Reported on 02/09/2016 09/29/15   [provider]  Icosapent Ethyl (VASCEPA) 1 g CAPS Take 2 capsules by mouth 2 (two) times daily. Patient not taking: Reported on 02/09/2016 02/07/16   [name], MD  loperamide (IMODIUM) 2 MG capsule Reported on 02/04/2016 09/15/15   [provider]  mirtazapine (REMERON) 15 MG tablet TAKE 1 TABLET(15 MG) BY MOUTH AT BEDTIME 05/13/19   [provider]  oxybutynin  (DITROPAN) 5 MG tablet Take 1 tablet (5 mg total) by mouth 2 (two) times daily. Patient not taking: Reported on 06/24/2019 02/19/16   [name], MD  oxyCODONE (ROXICODONE) 15 MG immediate release tablet  12/06/18   [provider]  oxyCODONE ER [facility] ER) 18 MG C12A Take by mouth.    [provider]  QUEtiapine (SEROQUEL) 50 MG tablet Take by mouth. 04/30/19   [provider]  zolpidem (AMBIEN) 10 MG tablet TAKE 1 TABLET DAILY AT BEDTIME AS NEEDED FOR SLEEP Patient not taking: Reported on 06/24/2019 02/02/16   [name], MD  zolpidem (AMBIEN) 10 MG tablet 1 tablet at bedtime as needed 01/23/15   [provider]    Family History Family History  Problem Relation Age of Onset  . Diabetes Sister   . Stroke Sister   . Emphysema Mother   . Stroke Father   . Cancer Sister        Colon  . Mental retardation Maternal Uncle     Social History Social History   Tobacco Use  . Smoking status: Never Smoker  . Smokeless tobacco: Never Used  Substance Use Topics  . Alcohol use: No    Comment: Social.  . Drug use: No     Allergies   Patient has no known allergies.   Review of Systems Review of Systems  Constitutional: Negative.   HENT: Negative.   Eyes: Negative.   Respiratory: Negative.   Cardiovascular: Negative.   Gastrointestinal: Negative.   Genitourinary: Negative.   Musculoskeletal: Positive for joint swelling.       Left ankle, knee, and hip pain.  Skin: Negative.   Neurological: Negative for syncope.     Physical Exam Triage Vital Signs ED Triage Vitals  Enc Vitals Group     BP 11/26/19 1612 108/62     Pulse Rate 11/26/19 1612 67     Resp 11/26/19 1612 18     Temp 11/26/19 1612 98.4 F (36.9 C)     Temp Source 11/26/19 1612 Oral     SpO2 11/26/19 1612 95 %     Weight 11/26/19 1616 138 lb (62.6 kg)     Height 11/26/19 1616 5' (1.524 m)     Head Circumference --      Peak Flow --      Pain Score 11/26/19 1614 8      Pain Loc --      Pain Edu? --      Excl. in GC? --    No data found.  Updated Vital Signs BP 108/62 (BP Location: Right Arm)   Pulse 67   Temp 98.4 F (36.9 C) (Oral)   Resp 18   Ht 5' (1.524 m)   Wt 62.6 kg   SpO2 95%   BMI 26.95 kg/m  Visual Acuity Right Eye Distance:   Left Eye Distance:   Bilateral Distance:    Right Eye Near:   Left Eye Near:    Bilateral Near:     Physical Exam Vitals signs and nursing note reviewed.  Constitutional:      General: She is not in acute distress. HENT:     Head: Atraumatic.     Nose: Nose normal.  Eyes:     Pupils: Pupils are equal, round, and reactive to light.  Neck:     Musculoskeletal: Normal range of motion.  Cardiovascular:     Rate and Rhythm: Normal rate.  Pulmonary:     Effort: Pulmonary effort is normal.  Musculoskeletal:     Comments: Bandage left in place during exam.  Patient has decreased range of motion of her left ankle and diffuse mild tenderness.  Left knee:  No effusion, erythema, or warmth.  Knee stable, negative drawer test.  Tenderness over MCL. McMurray test negative.  Diffuse mild tenderness to palpation.    Vague tenderness to palpation left anterior thigh.  Left hip has good range of motion.  Skin:    General: Skin is warm and dry.  Neurological:     Mental Status: She is alert.      UC Treatments / Results  Labs (all labs ordered are listed, but only abnormal results are displayed) Labs Reviewed - No data to display  EKG   Radiology Dg Ankle Complete Left  Result Date: 11/26/2019 CLINICAL DATA:  Fall several days ago injuring left ankle. Fall 9 days ago, x-rays of left ankle and left foot local emergency department negative. Persistent left knee and ankle pain. EXAM: LEFT ANKLE COMPLETE - 3+ VIEW COMPARISON:  None available. FINDINGS: There is no evidence of fracture, dislocation, or joint effusion. The ankle mortise is preserved. Mild generalized soft tissue edema. A dressing overlies  the posterior calcaneus. IMPRESSION: 1. No acute or healing fracture of the ankle. 2. Soft tissue edema. Electronically Signed   By: Narda Rutherford M.D.   On: 11/26/2019 17:46   Dg Knee Complete 4 Views Left  Result Date: 11/26/2019 CLINICAL DATA:  Fall several days ago injuring left knee. Fall 9 days ago, x-rays of left ankle and left foot local emergency department negative. Persistent left knee and ankle pain. EXAM: LEFT KNEE - COMPLETE 4+ VIEW COMPARISON:  None. FINDINGS: No evidence of fracture, dislocation, or joint effusion. Minor medial compartment osteoarthritis, less than typically seen for age. Soft tissues are unremarkable. IMPRESSION: 1. No fracture or subluxation of the left knee. 2. Minor medial compartment osteoarthritis. Electronically Signed   By: Narda Rutherford M.D.   On: 11/26/2019 17:47   Dg Femur Min 2 Views Left  Result Date: 11/26/2019 CLINICAL DATA:  Fall several days ago injuring left upper leg. Fall 9 days ago, x-rays of left ankle and left foot local emergency department negative. Persistent left knee and ankle pain. EXAM: LEFT FEMUR 2 VIEWS COMPARISON:  None. FINDINGS: Left hip arthroplasty in expected alignment. No periprosthetic fracture. No femur fracture. No focal soft tissue abnormality. IMPRESSION: No fracture of the left femur.  Intact left hip arthroplasty. Electronically Signed   By: Narda Rutherford M.D.   On: 11/26/2019 17:48    Procedures Procedures (including critical care time)  Medications Ordered in UC Medications - No data to display  Initial Impression / Assessment and Plan / UC Course  I have reviewed the triage vital signs and the nursing notes.  Pertinent labs &  imaging results that were available during my care of the patient were reviewed by me and considered in my medical decision making (see chart for details).    Suspect mild MCL sprain left knee.  Dispensed hinged knee brace.  May continue velcro ankle brace (already at home).  Recommend follow-up Dr. Rodney Langtonhomas Thekkekandam for comprehensive left lower limb evaluation.   Final Clinical Impressions(s) / UC Diagnoses   Final diagnoses:  Sprain of right ankle, unspecified ligament, initial encounter  Acute pain of right knee     Discharge Instructions     Wear hinged knee brace, and velcro ankle brace.    ED Prescriptions    None        Lattie HawBeese, Stephanieann Popescu A, MD 11/28/19 2024

## 2019-11-26 NOTE — ED Triage Notes (Signed)
Patient took oxycodone at around 1130.  Brought to room from car via wheelchair.

## 2019-11-26 NOTE — Discharge Instructions (Signed)
Wear hinged knee brace, and velcro ankle brace.

## 2020-12-11 IMAGING — DX DG KNEE COMPLETE 4+V*L*
4 series · 4 of 4 positions shown · non-contrast
Comparison: None.

CLINICAL DATA: Fall several days ago injuring left knee. Fall 9
days ago, x-rays of left ankle and left foot local emergency
department negative. Persistent left knee and ankle pain.

EXAM:
LEFT KNEE - COMPLETE 4+ VIEW

[knee ap]
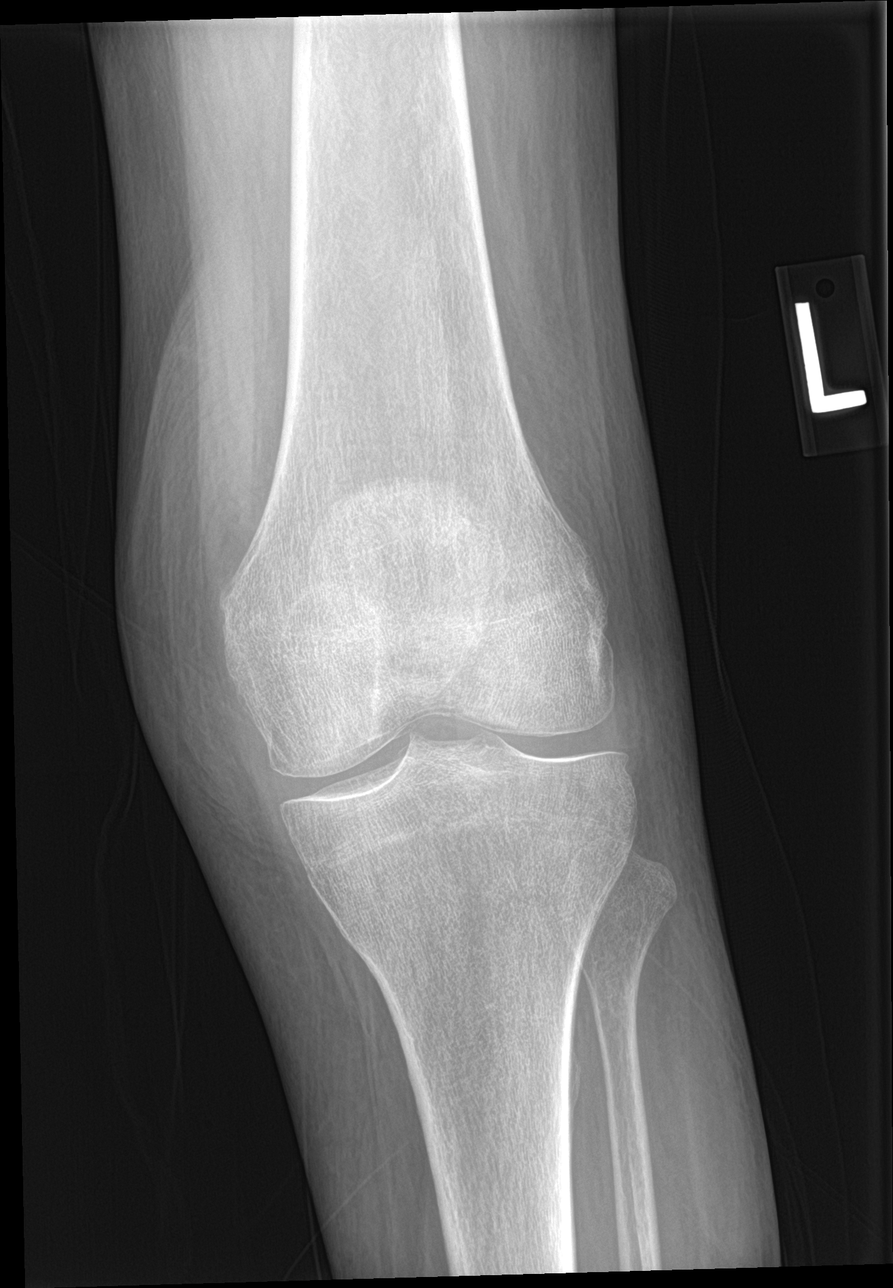

[knee sunrise]
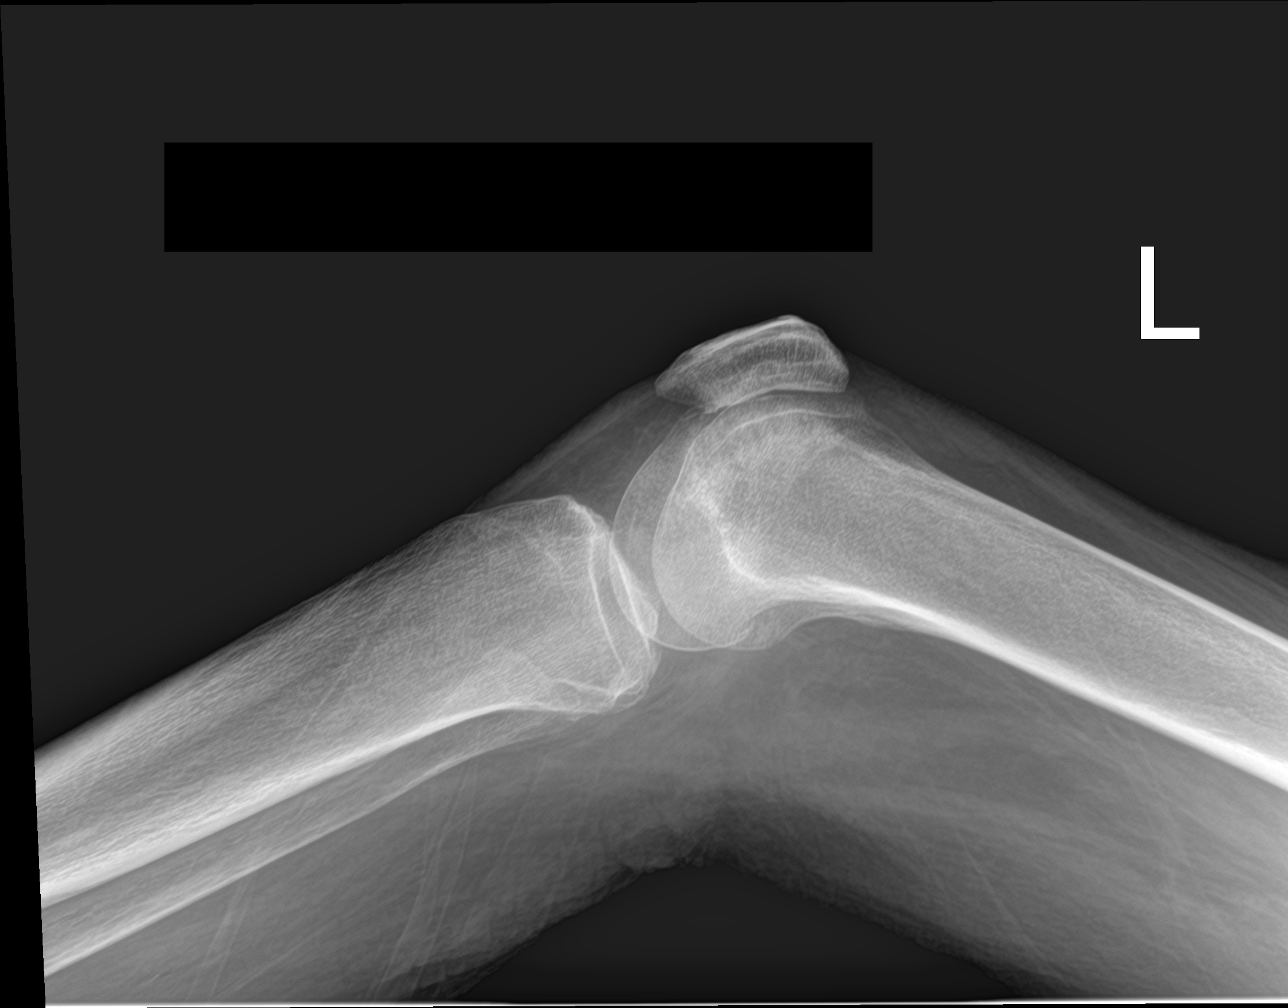

[knee obl (1 of 2)]
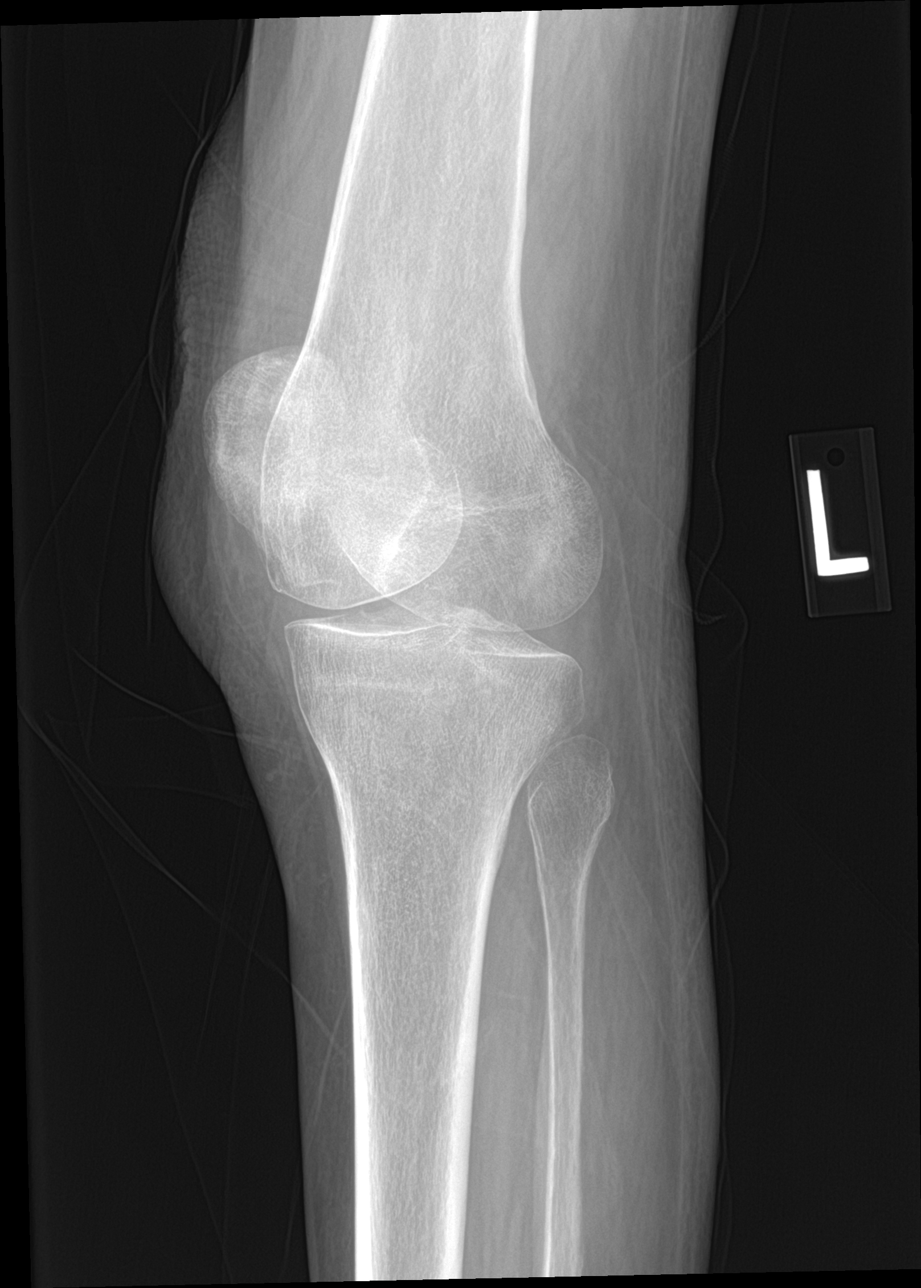

[knee obl (2 of 2)]
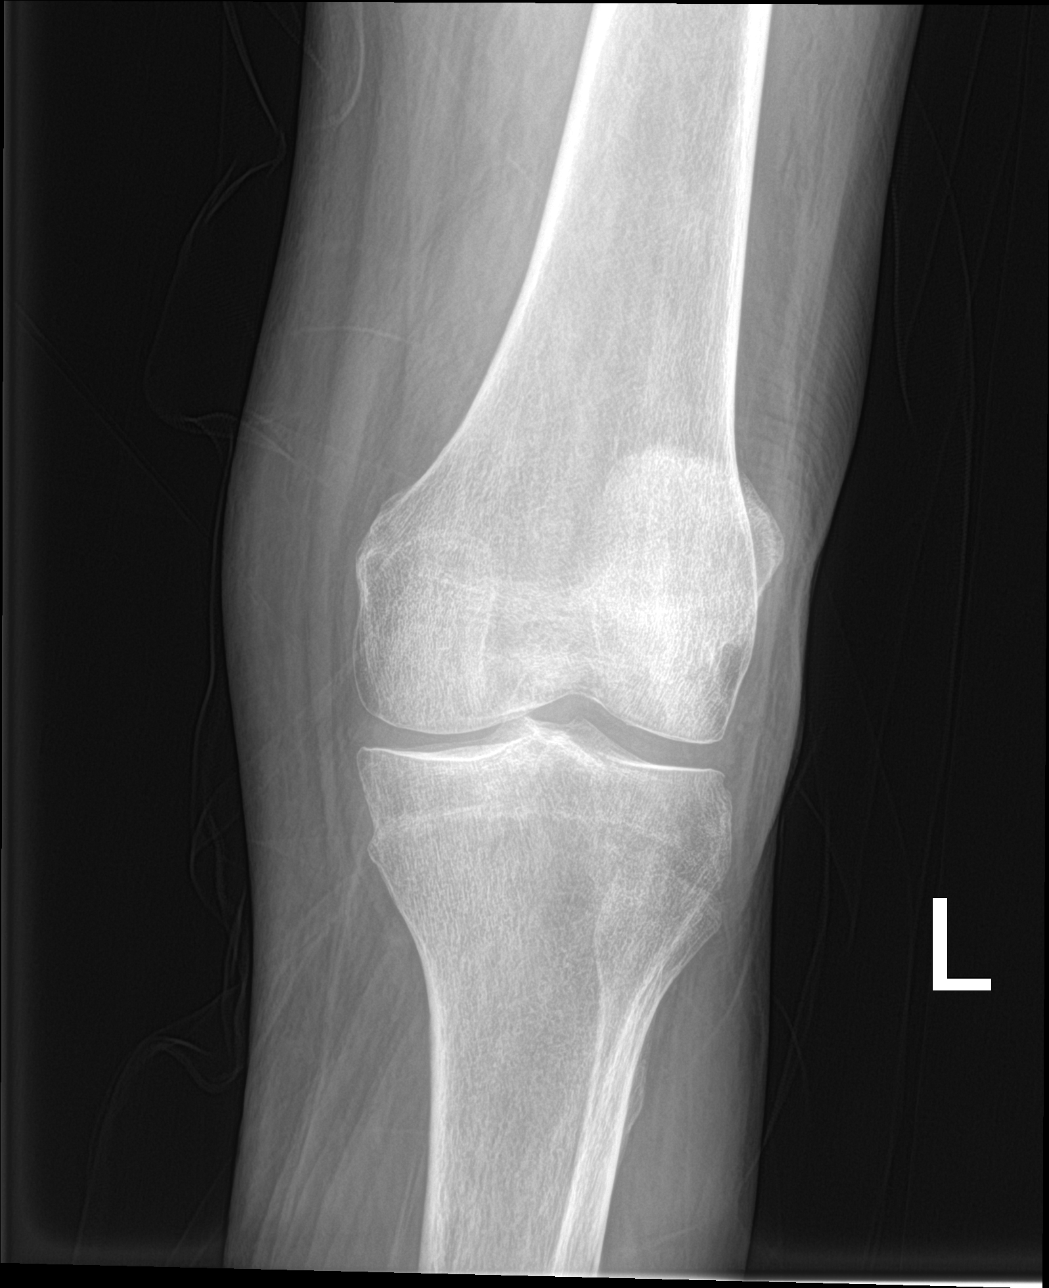

[4 of 4 positions shown; findings below may reference images not displayed]

FINDINGS: No evidence of fracture, dislocation, or joint effusion. Minor
medial compartment osteoarthritis, less than typically seen for age.
Soft tissues are unremarkable.
IMPRESSION: 1. No fracture or subluxation of the left knee.
2. Minor medial compartment osteoarthritis.

## 2020-12-11 IMAGING — DX DG ANKLE COMPLETE 3+V*L*
3 series · 3 of 3 positions shown · non-contrast
Comparison: None available.

CLINICAL DATA: Fall several days ago injuring left ankle. Fall 9
days ago, x-rays of left ankle and left foot local emergency
department negative. Persistent left knee and ankle pain.

EXAM:
LEFT ANKLE COMPLETE - 3+ VIEW

[ankle ap]
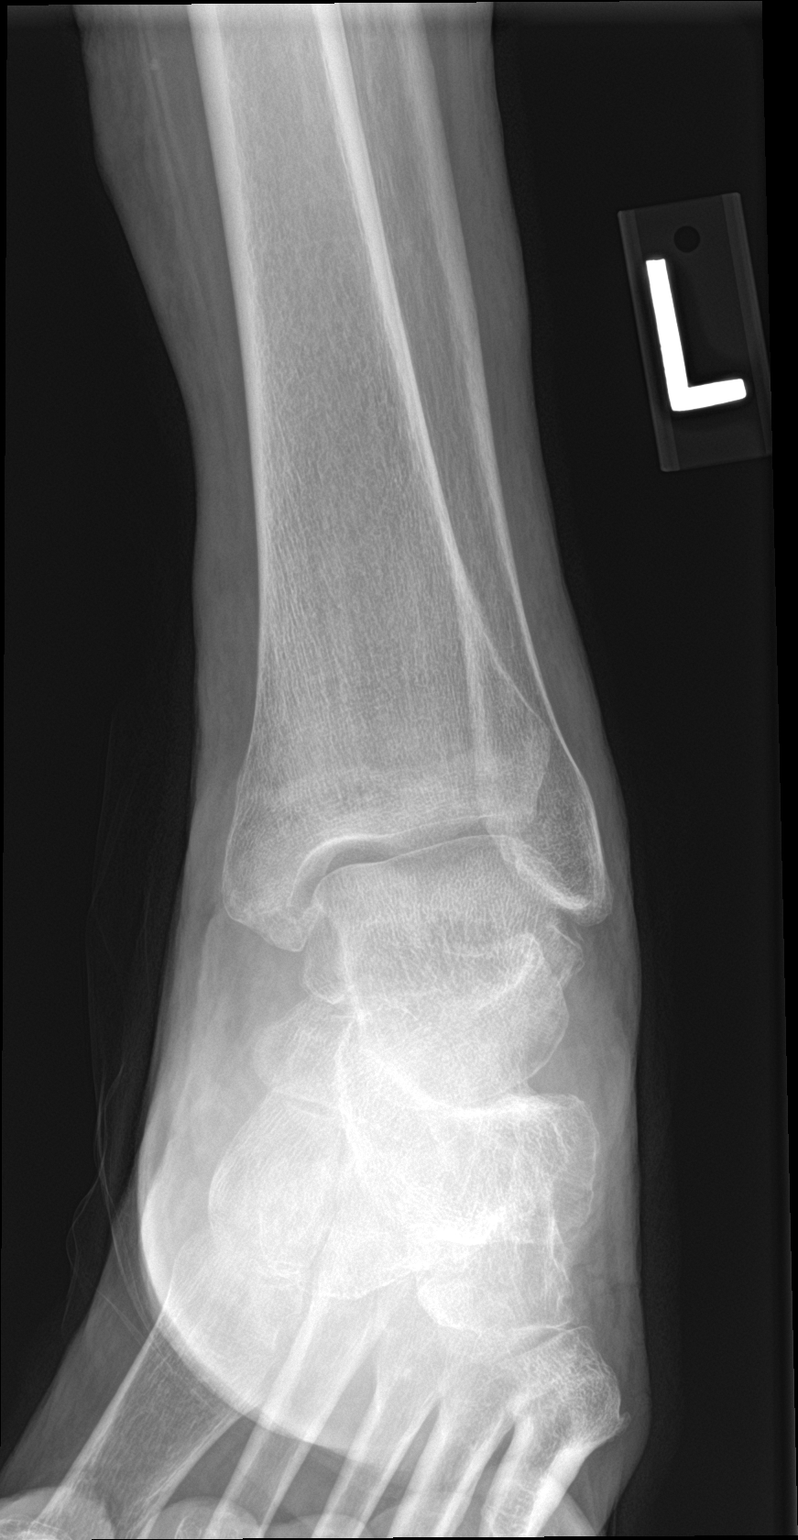

[ankle obl]
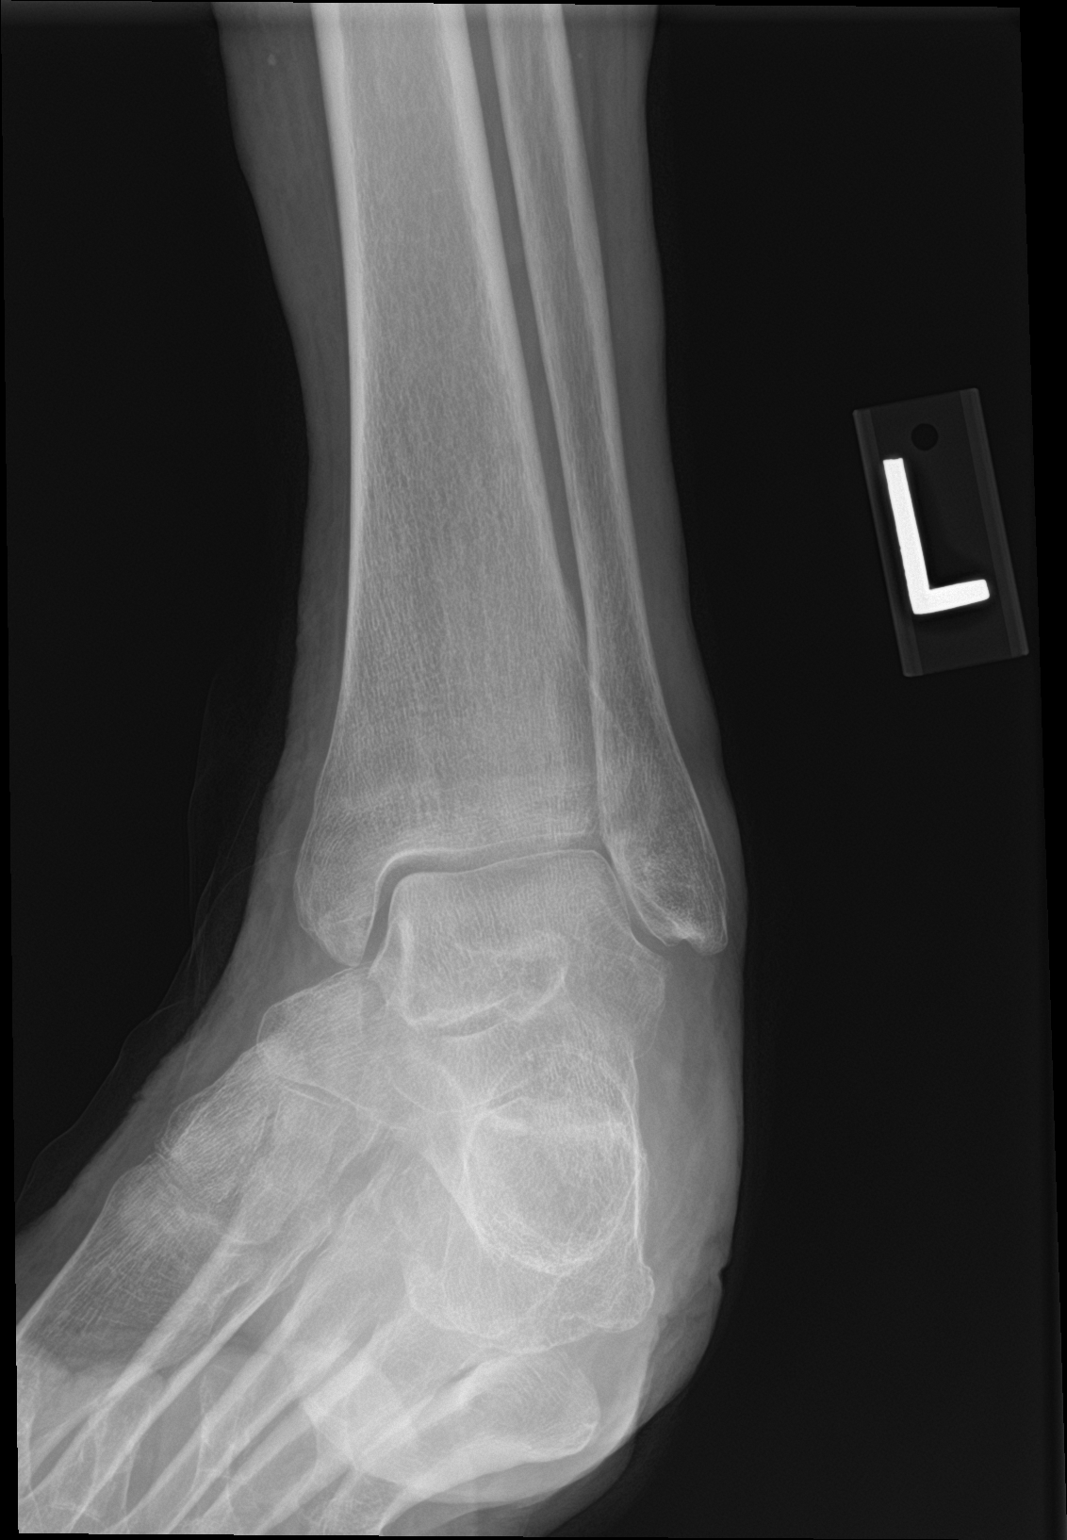

[ankle lat]
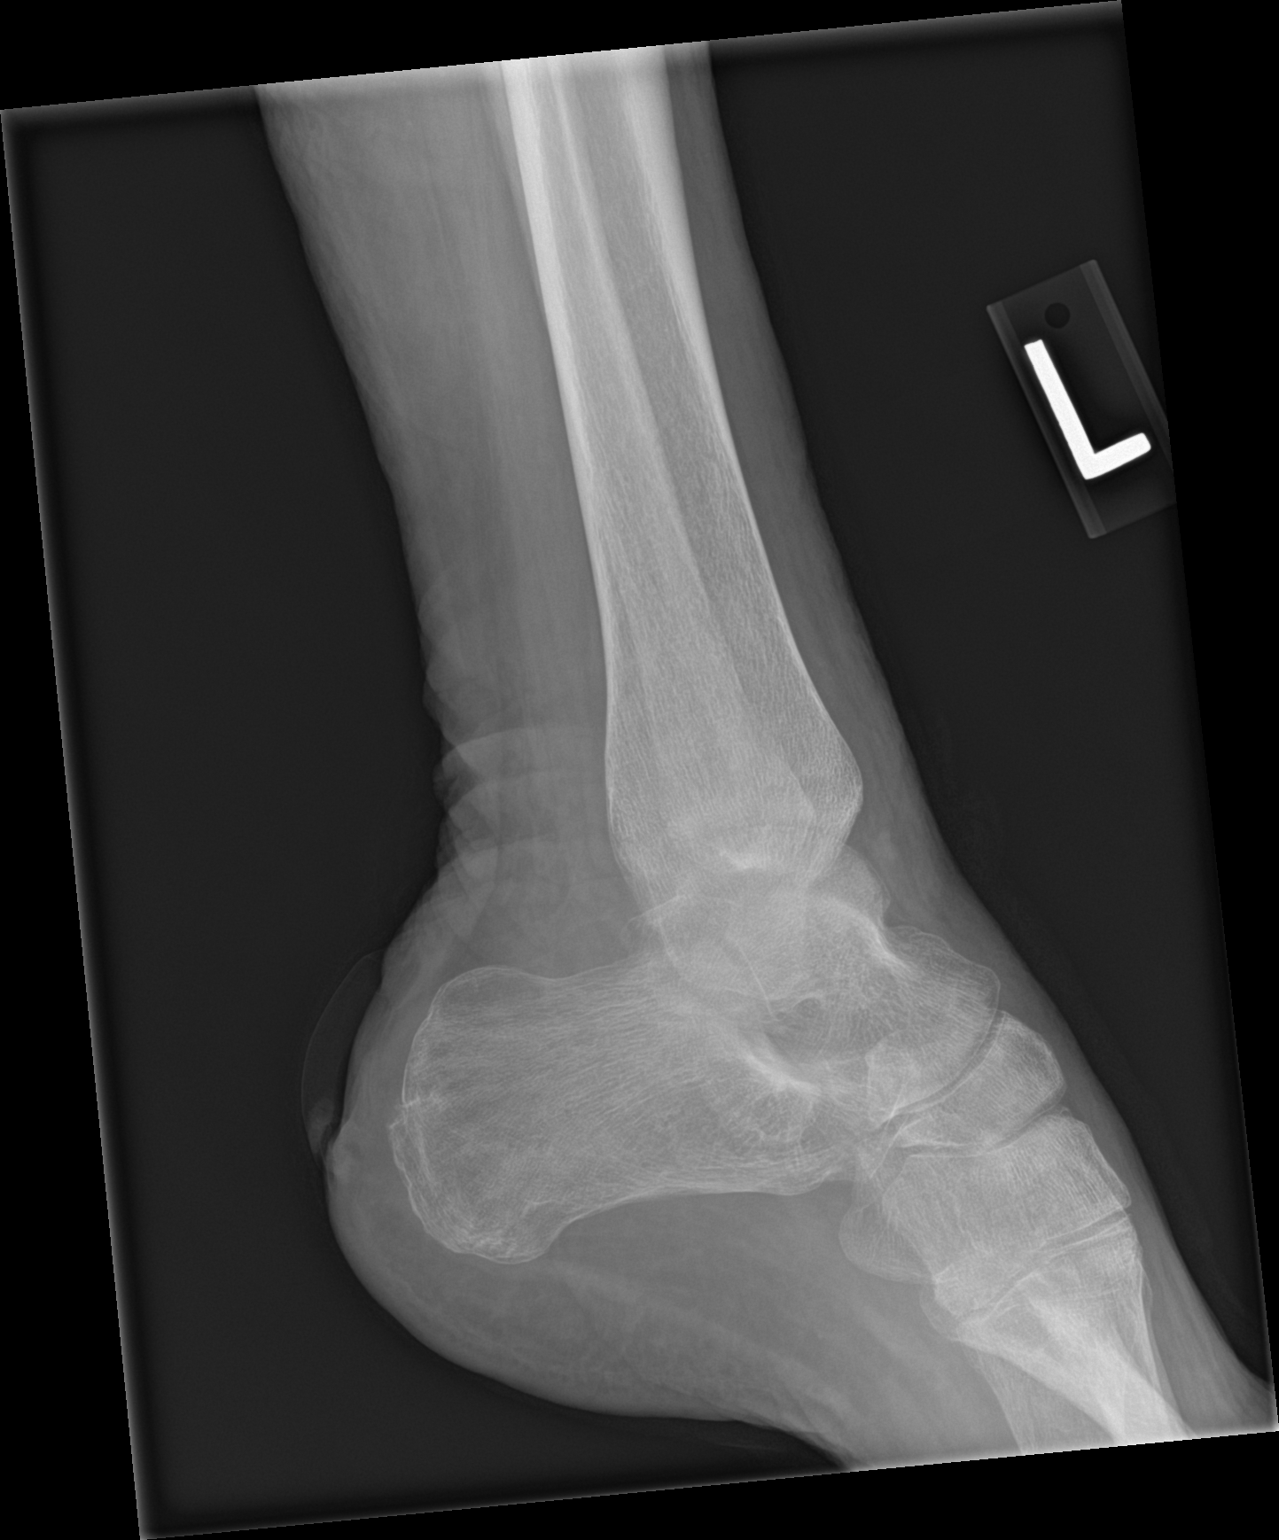

[3 of 3 positions shown; findings below may reference images not displayed]

FINDINGS: There is no evidence of fracture, dislocation, or joint effusion.
The ankle mortise is preserved. Mild generalized soft tissue edema.
A dressing overlies the posterior calcaneus.
IMPRESSION: 1. No acute or healing fracture of the ankle.
2. Soft tissue edema.

## 2021-01-08 ENCOUNTER — Encounter (HOSPITAL_BASED_OUTPATIENT_CLINIC_OR_DEPARTMENT_OTHER): Payer: Medicare HMO | Admitting: Internal Medicine

## 2021-01-14 ENCOUNTER — Other Ambulatory Visit: Payer: Self-pay | Admitting: Family Medicine

## 2022-09-25 DEATH — deceased
